# Patient Record
Sex: Male | Born: 1955 | Race: Black or African American | Hispanic: No | Marital: Single | Smoking: Former smoker
Health system: Southern US, Community
[De-identification: ages and names within clinical notes are randomized; demographics above are authoritative.]

## PROBLEM LIST (undated history)

## (undated) DIAGNOSIS — E119 Type 2 diabetes mellitus without complications: Secondary | ICD-10-CM

## (undated) DIAGNOSIS — E876 Hypokalemia: Secondary | ICD-10-CM

## (undated) DIAGNOSIS — L723 Sebaceous cyst: Secondary | ICD-10-CM

## (undated) DIAGNOSIS — I10 Essential (primary) hypertension: Secondary | ICD-10-CM

## (undated) DIAGNOSIS — E559 Vitamin D deficiency, unspecified: Secondary | ICD-10-CM

## (undated) DIAGNOSIS — B353 Tinea pedis: Secondary | ICD-10-CM

## (undated) HISTORY — DX: Vitamin D deficiency, unspecified: E55.9

## (undated) HISTORY — DX: Hypokalemia: E87.6

## (undated) HISTORY — PX: APPENDECTOMY: SHX54

## (undated) HISTORY — DX: Tinea pedis: B35.3

## (undated) HISTORY — DX: Sebaceous cyst: L72.3

---

## 2004-02-02 ENCOUNTER — Emergency Department: Payer: Self-pay | Admitting: Emergency Medicine

## 2005-01-26 ENCOUNTER — Emergency Department: Payer: Self-pay | Admitting: Emergency Medicine

## 2007-06-14 ENCOUNTER — Emergency Department: Payer: Self-pay | Admitting: Emergency Medicine

## 2007-07-16 ENCOUNTER — Emergency Department: Payer: Self-pay | Admitting: Emergency Medicine

## 2010-05-15 ENCOUNTER — Emergency Department: Payer: Self-pay | Admitting: Emergency Medicine

## 2015-03-25 ENCOUNTER — Emergency Department
Admission: EM | Admit: 2015-03-25 | Discharge: 2015-03-26 | Disposition: A | Discharge status: Home / Self Care | Payer: BLUE CROSS/BLUE SHIELD | Attending: Emergency Medicine | Admitting: Emergency Medicine

## 2015-03-25 ENCOUNTER — Emergency Department: Payer: BLUE CROSS/BLUE SHIELD

## 2015-03-25 ENCOUNTER — Encounter: Payer: Self-pay | Admitting: Emergency Medicine

## 2015-03-25 DIAGNOSIS — E876 Hypokalemia: Secondary | ICD-10-CM | POA: Diagnosis not present

## 2015-03-25 DIAGNOSIS — Z87891 Personal history of nicotine dependence: Secondary | ICD-10-CM | POA: Diagnosis not present

## 2015-03-25 DIAGNOSIS — I10 Essential (primary) hypertension: Secondary | ICD-10-CM

## 2015-03-25 DIAGNOSIS — E119 Type 2 diabetes mellitus without complications: Secondary | ICD-10-CM | POA: Insufficient documentation

## 2015-03-25 HISTORY — DX: Type 2 diabetes mellitus without complications: E11.9

## 2015-03-25 HISTORY — DX: Essential (primary) hypertension: I10

## 2015-03-25 LAB — TROPONIN I: TROPONIN I: 0.03 ng/mL (ref <0.031)

## 2015-03-25 LAB — BASIC METABOLIC PANEL
ANION GAP: 9 (ref 5–15)
BUN: 16 mg/dL (ref 6–20)
CALCIUM: 8.9 mg/dL (ref 8.9–10.3)
CHLORIDE: 100 mmol/L — AB (ref 101–111)
CO2: 28 mmol/L (ref 22–32)
Creatinine, Ser: 1.15 mg/dL (ref 0.61–1.24)
GFR calc non Af Amer: >60 mL/min (ref >60)
Glucose, Bld: 118 mg/dL — ABNORMAL HIGH (ref 65–99)
Potassium: 2.5 mmol/L — CL (ref 3.5–5.1)
Sodium: 137 mmol/L (ref 135–145)

## 2015-03-25 LAB — CBC
HCT: 41 % (ref 40.0–52.0)
HEMOGLOBIN: 14 g/dL (ref 13.0–18.0)
MCH: 27.6 pg (ref 26.0–34.0)
MCHC: 34.1 g/dL (ref 32.0–36.0)
MCV: 81 fL (ref 80.0–100.0)
Platelets: 248 10*3/uL (ref 150–440)
RBC: 5.06 MIL/uL (ref 4.40–5.90)
RDW: 14.7 % — ABNORMAL HIGH (ref 11.5–14.5)
WBC: 9.3 10*3/uL (ref 3.8–10.6)

## 2015-03-25 MED ORDER — POTASSIUM CHLORIDE 20 MEQ PO PACK
40.0000 meq | PACK | Freq: Two times a day (BID) | ORAL | Status: DC
Start: 2015-03-25 — End: 2015-03-26
  Administered 2015-03-25: 40 meq via ORAL
  Filled 2015-03-25 (×2): qty 2

## 2015-03-25 MED ORDER — LISINOPRIL 10 MG PO TABS
10.0000 mg | ORAL_TABLET | Freq: Once | ORAL | Status: AC
  Administered 2015-03-25: 10 mg via ORAL
  Filled 2015-03-25: qty 1

## 2015-03-25 MED ORDER — AMLODIPINE BESYLATE 5 MG PO TABS
5.0000 mg | ORAL_TABLET | Freq: Once | ORAL | Status: AC
  Administered 2015-03-25: 5 mg via ORAL
  Filled 2015-03-25: qty 1

## 2015-03-25 NOTE — ED Provider Notes (Signed)
[facility] Emergency Department Provider Note  ____________________________________________  Time seen: 11:00PM  I have reviewed the triage vital signs and the nursing notes.   HISTORY  Chief Complaint Hypertension    HPI [name] is a 60 y.o. male with history of Hypertension, Diabetes presents referred from [facility] for elevated BP and EKG changes. Patient states that he's been out of his amlodipine lisinopril and carvedilol for approximately few weeks and has only been taking his HCTZ. [facility] went to [facility] today for medication refill and was informed that his EKG was abnormal and referred to the emergency part. Patient presents with a blood pressure of 192/121. Patient denies any chest pain or shortness of breath or any other symptoms at this time.   Past Medical History  Diagnosis Date  . Hypertension   . Diabetes mellitus without complication (HCC)     There are no active problems to display for this patient.   Past Surgical History  Procedure Laterality Date  . Appendectomy      No current outpatient prescriptions on file.  Allergies No known drug allergies No family history on file.  Social History Social History  Substance Use Topics  . Smoking status: Former Games developermoker  . Smokeless tobacco: None  . Alcohol Use: No    Review of Systems  Constitutional: Negative for fever. Eyes: Negative for visual changes. ENT: Negative for sore throat. Cardiovascular: Negative for chest pain. Respiratory: Negative for shortness of breath. Gastrointestinal: Negative for abdominal pain, vomiting and diarrhea. Genitourinary: Negative for dysuria. Musculoskeletal: Negative for back pain. Skin: Negative for rash. Neurological: Negative for headaches, focal weakness or numbness.   10-point ROS otherwise negative.  ____________________________________________   PHYSICAL EXAM:  VITAL SIGNS: ED Triage Vitals  Enc  Vitals Group     BP 03/25/15 2025 192/121 mmHg     Pulse Rate 03/25/15 2025 81     Resp 03/25/15 2025 20     Temp 03/25/15 2025 97.6 F (36.4 C)     Temp Source 03/25/15 2025 Oral     SpO2 03/25/15 2025 97 %     Weight 03/25/15 2025 216 lb (97.977 kg)     Height 03/25/15 2025 5\' 9"  (1.753 m)     Head Cir --      Peak Flow --      Pain Score --      Pain Loc --      Pain Edu? --      Excl. in GC? --     Constitutional: Alert and oriented. Well appearing and in no distress. Eyes: Conjunctivae are normal. PERRL. Normal extraocular movements. ENT   Head: Normocephalic and atraumatic.   Nose: No congestion/rhinnorhea.   Mouth/Throat: Mucous membranes are moist.   Neck: No stridor. Hematological/Lymphatic/Immunilogical: No cervical lymphadenopathy. Cardiovascular: Normal rate, regular rhythm. Normal and symmetric distal pulses are present in all extremities. No murmurs, rubs, or gallops. Respiratory: Normal respiratory effort without tachypnea nor retractions. Breath sounds are clear and equal bilaterally. No wheezes/rales/rhonchi. Gastrointestinal: Soft and nontender. No distention. There is no CVA tenderness. Genitourinary: deferred Musculoskeletal: Nontender with normal range of motion in all extremities. No joint effusions.  No lower extremity tenderness nor edema. Neurologic:  Normal speech and language. No gross focal neurologic deficits are appreciated. Speech is normal.  Skin:  Skin is warm, dry and intact. No rash noted. Psychiatric: Mood and affect are normal. Speech and behavior are normal. Patient exhibits appropriate insight and judgment.  ____________________________________________  LABS (pertinent positives/negatives)  Labs Reviewed  BASIC METABOLIC PANEL - Abnormal; Notable for the following:    Potassium 2.5 (*)    Chloride 100 (*)    Glucose, Bld 118 (*)    All other components within normal limits  CBC - Abnormal; Notable for the following:     RDW 14.7 (*)    All other components within normal limits  TROPONIN I     ____________________________________________   EKG  ED ECG REPORT I, [place] N BROWN, the attending physician, personally viewed and interpreted this ECG.   Date: 03/25/2015  EKG Time: 8:40 PM  Rate: 81  Rhythm: Normal sinus rhythm with inverted T waves  Axis: Normal  Intervals: Normal  ST&T Change: None   ____________________________________________    RADIOLOGY     DG Chest 2 View (Final result) Result time: 03/25/15 21:15:05   Final result by Rad Results In Interface (03/25/15 21:15:05)   Narrative:   CLINICAL DATA: Hypertension.  EXAM: CHEST 2 VIEW  COMPARISON: None.  FINDINGS: Heart size is normal. Overall cardiomediastinal silhouette is within normal limits in size and configuration. Lungs are clear. Lung volumes are normal. No pleural effusion. Osseous and soft tissue structures about the chest are unremarkable.  IMPRESSION: Lungs are clear and there is no evidence of acute cardiopulmonary abnormality. Heart size is normal.   Electronically Signed By: Bary Richard M.D. On: 03/25/2015 21:15          INITIAL IMPRESSION / ASSESSMENT AND PLAN / ED COURSE  Pertinent labs & imaging results that were available during my care of the patient were reviewed by me and considered in my medical decision making (see chart for details).  Patient potassium noted to be 2.5 which is potential etiology for the patient's T-wave inversion. Following administration of 80 mEq and 40 mEq aliquots of potassium chloride patient's EKG normalized. Again patient has no chest pain or shortness of breath at this time  ____________________________________________   FINAL CLINICAL IMPRESSION(S) / ED DIAGNOSES  Final diagnoses:  Hypokalemia  Essential hypertension      Darci Current, MD 03/26/15 [phone]

## 2015-03-25 NOTE — ED Notes (Signed)
Family requesting to have their number left.  Francena HanlyStella [phone].

## 2015-03-25 NOTE — ED Notes (Signed)
[place] meal tray given to patient  

## 2015-03-25 NOTE — ED Notes (Signed)
Patient states he was sent over by his PCP due to an abnormal EKG. Patient denies N/V/D, CP or SOB.

## 2015-03-25 NOTE — ED Notes (Addendum)
Patient ambulatory to triage with steady gait, without difficulty or distress noted, pt sent over by [facility] for HTN and EKG changes; left arm BP 191/121, right arm 195/109; pt denies c/o; st st out of BP meds (amlodipine, lisinopril and carvedilol) for few wks and was only taking HCTZ

## 2015-03-26 LAB — BASIC METABOLIC PANEL
ANION GAP: 10 (ref 5–15)
BUN: 18 mg/dL (ref 6–20)
CHLORIDE: 99 mmol/L — AB (ref 101–111)
CO2: 28 mmol/L (ref 22–32)
Calcium: 8.7 mg/dL — ABNORMAL LOW (ref 8.9–10.3)
Creatinine, Ser: 1.14 mg/dL (ref 0.61–1.24)
GFR calc non Af Amer: >60 mL/min (ref >60)
Glucose, Bld: 192 mg/dL — ABNORMAL HIGH (ref 65–99)
POTASSIUM: 3.3 mmol/L — AB (ref 3.5–5.1)
SODIUM: 137 mmol/L (ref 135–145)

## 2015-03-26 LAB — TROPONIN I: Troponin I: <0.03 ng/mL (ref <0.031)

## 2015-03-26 MED ORDER — POTASSIUM CHLORIDE 20 MEQ PO PACK
40.0000 meq | PACK | Freq: Two times a day (BID) | ORAL | Status: DC
  Administered 2015-03-26: 40 meq via ORAL

## 2015-03-26 NOTE — Discharge Instructions (Signed)
Hypertension °Hypertension, commonly called high blood pressure, is when the force of blood pumping through your arteries is too strong. Your arteries are the blood vessels that carry blood from your heart throughout your body. A blood pressure reading consists of a higher number over a lower number, such as 110/72. The higher number (systolic) is the pressure inside your arteries when your heart pumps. The lower number (diastolic) is the pressure inside your arteries when your heart relaxes. Ideally you want your blood pressure below 120/80. °Hypertension forces your heart to work harder to pump blood. Your arteries may become narrow or stiff. Having untreated or uncontrolled hypertension can cause heart attack, stroke, kidney disease, and other problems. °RISK FACTORS °Some risk factors for high blood pressure are controllable. Others are not.  °Risk factors you cannot control include:  °· Race. You may be at higher risk if you are African American. °· Age. Risk increases with age. °· Gender. Men are at higher risk than women before age 45 years. After age 65, women are at higher risk than men. °Risk factors you can control include: °· Not getting enough exercise or physical activity. °· Being overweight. °· Getting too much fat, sugar, calories, or salt in your diet. °· Drinking too much alcohol. °SIGNS AND SYMPTOMS °Hypertension does not usually cause signs or symptoms. Extremely high blood pressure (hypertensive crisis) may cause headache, anxiety, shortness of breath, and nosebleed. °DIAGNOSIS °To check if you have hypertension, your health care provider will measure your blood pressure while you are seated, with your arm held at the level of your heart. It should be measured at least twice using the same arm. Certain conditions can cause a difference in blood pressure between your right and left arms. A blood pressure reading that is higher than normal on one occasion does not mean that you need treatment. If  it is not clear whether you have high blood pressure, you may be asked to return on a different day to have your blood pressure checked again. Or, you may be asked to monitor your blood pressure at home for 1 or more weeks. °TREATMENT °Treating high blood pressure includes making lifestyle changes and possibly taking medicine. Living a healthy lifestyle can help lower high blood pressure. You may need to change some of your habits. °Lifestyle changes may include: °· Following the DASH diet. This diet is high in fruits, vegetables, and whole grains. It is low in salt, red meat, and added sugars. °· Keep your sodium intake below 2,300 mg per day. °· Getting at least 30-45 minutes of aerobic exercise at least 4 times per week. °· Losing weight if necessary. °· Not smoking. °· Limiting alcoholic beverages. °· Learning ways to reduce stress. °Your health care provider may prescribe medicine if lifestyle changes are not enough to get your blood pressure under control, and if one of the following is true: °· You are 18-59 years of age and your systolic blood pressure is above 140. °· You are 60 years of age or older, and your systolic blood pressure is above 150. °· Your diastolic blood pressure is above 90. °· You have diabetes, and your systolic blood pressure is over 140 or your diastolic blood pressure is over 90. °· You have kidney disease and your blood pressure is above 140/90. °· You have heart disease and your blood pressure is above 140/90. °Your personal target blood pressure may vary depending on your medical conditions, your age, and other factors. °HOME CARE INSTRUCTIONS °·   Have your blood pressure rechecked as directed by your health care provider.   °· Take medicines only as directed by your health care provider. Follow the directions carefully. Blood pressure medicines must be taken as prescribed. The medicine does not work as well when you skip doses. Skipping doses also puts you at risk for  problems. °· Do not smoke.   °· Monitor your blood pressure at home as directed by your health care provider.  °SEEK MEDICAL CARE IF:  °· You think you are having a reaction to medicines taken. °· You have recurrent headaches or feel dizzy. °· You have swelling in your ankles. °· You have trouble with your vision. °SEEK IMMEDIATE MEDICAL CARE IF: °· You develop a severe headache or confusion. °· You have unusual weakness, numbness, or feel faint. °· You have severe chest or abdominal pain. °· You vomit repeatedly. °· You have trouble breathing. °MAKE SURE YOU:  °· Understand these instructions. °· Will watch your condition. °· Will get help right away if you are not doing well or get worse. °  °This information is not intended to replace advice given to you by your health care provider. Make sure you discuss any questions you have with your health care provider. °  °Document Released: 12/28/2004 Document Revised: 05/14/2014 Document Reviewed: 10/20/2012 °Elsevier Interactive Patient Education ©2016 [employer] ° °Hypokalemia °Hypokalemia means that the amount of potassium in the blood is lower than normal. Potassium is a chemical, called an electrolyte, that helps regulate the amount of fluid in the body. It also stimulates muscle contraction and helps nerves function properly. Most of the body's potassium is inside of cells, and only a very small amount is in the blood. Because the amount in the blood is so small, minor changes can be life-threatening. °CAUSES °· Antibiotics. °· Diarrhea or vomiting. °· Using laxatives too much, which can cause diarrhea. °· Chronic kidney disease. °· Water pills (diuretics). °· Eating disorders (bulimia). °· Low magnesium level. °· Sweating a lot. °SIGNS AND SYMPTOMS °· Weakness. °· Constipation. °· Fatigue. °· Muscle cramps. °· Mental confusion. °· Skipped heartbeats or irregular heartbeat (palpitations). °· Tingling or numbness. °DIAGNOSIS  °Your health care provider can  diagnose hypokalemia with blood tests. In addition to checking your potassium level, your health care provider may also check other lab tests. °TREATMENT °Hypokalemia can be treated with potassium supplements taken by mouth or adjustments in your current medicines. If your potassium level is very low, you may need to get potassium through a vein (IV) and be monitored in the hospital. A diet high in potassium is also helpful. Foods high in potassium are: °· Nuts, such as peanuts and pistachios. °· Seeds, such as sunflower seeds and pumpkin seeds. °· Peas, lentils, and lima beans. °· Whole grain and bran cereals and breads. °· Fresh fruit and vegetables, such as apricots, avocado, bananas, cantaloupe, kiwi, oranges, tomatoes, asparagus, and potatoes. °· Orange and tomato juices. °· Red meats. °· Fruit yogurt. °HOME CARE INSTRUCTIONS °· Take all medicines as prescribed by your health care provider. °· Maintain a healthy diet by including nutritious food, such as fruits, vegetables, nuts, whole grains, and lean meats. °· If you are taking a laxative, be sure to follow the directions on the label. °SEEK MEDICAL CARE IF: °· Your weakness gets worse. °· You feel your heart pounding or racing. °· You are vomiting or having diarrhea. °· You are diabetic and having trouble keeping your blood glucose in the normal range. °SEEK IMMEDIATE MEDICAL CARE IF: °·   You have chest pain, shortness of breath, or dizziness. °· You are vomiting or having diarrhea for more than 2 days. °· You faint. °MAKE SURE YOU:  °· Understand these instructions. °· Will watch your condition. °· Will get help right away if you are not doing well or get worse. °  °This information is not intended to replace advice given to you by your health care provider. Make sure you discuss any questions you have with your health care provider. °  °Document Released: 12/28/2004 Document Revised: 01/18/2014 Document Reviewed: 06/30/2012 °Elsevier Interactive Patient  Education ©2016 Elsevier Inc. ° °

## 2019-04-09 ENCOUNTER — Ambulatory Visit: Payer: BLUE CROSS/BLUE SHIELD | Attending: Internal Medicine

## 2019-04-09 DIAGNOSIS — Z23 Encounter for immunization: Secondary | ICD-10-CM

## 2019-04-09 NOTE — Progress Notes (Signed)
   Covid-19 Vaccination Clinic  Name:  KALON ERHARDT    MRN: [id number] DOB: 06/01/1955  04/09/2019  Mr. Gallaga was observed post Covid-19 immunization for 15 minutes without incident. He was provided with Vaccine Information Sheet and instruction to access the V-Safe system.   Mr. Maue was instructed to call 911 with any severe reactions post vaccine: Marland Kitchen Difficulty breathing  . Swelling of face and throat  . A fast heartbeat  . A bad rash all over body  . Dizziness and weakness   Immunizations Administered    Name Date Dose VIS Date Route   Pfizer COVID-19 Vaccine 04/09/2019  1:40 PM 0.3 mL 12/22/2018 Intramuscular   Manufacturer: [employer], [employer]   Lot: [id number]   NDC: [address]-3

## 2019-05-02 ENCOUNTER — Ambulatory Visit: Payer: Self-pay | Attending: Internal Medicine

## 2019-05-02 DIAGNOSIS — Z23 Encounter for immunization: Secondary | ICD-10-CM

## 2019-05-02 NOTE — Progress Notes (Signed)
   Covid-19 Vaccination Clinic  Name:  KYSER WANDEL    MRN: [id number] DOB: 1955/02/13  05/02/2019  Mr. Strine was observed post Covid-19 immunization for 15 minutes without incident. He was provided with Vaccine Information Sheet and instruction to access the V-Safe system.   Mr. Bells was instructed to call 911 with any severe reactions post vaccine: Marland Kitchen Difficulty breathing  . Swelling of face and throat  . A fast heartbeat  . A bad rash all over body  . Dizziness and weakness   Immunizations Administered    Name Date Dose VIS Date Route   Pfizer COVID-19 Vaccine 05/02/2019  9:34 AM 0.3 mL 03/07/2018 Intramuscular   Manufacturer: [employer], [employer]   Lot: [id number]   NDC: [address]-6

## 2020-10-06 ENCOUNTER — Encounter: Payer: Self-pay | Admitting: Cardiology

## 2020-10-06 ENCOUNTER — Other Ambulatory Visit: Payer: Self-pay

## 2020-10-06 ENCOUNTER — Ambulatory Visit: Payer: Medicare (Managed Care) | Admitting: Cardiology

## 2020-10-06 VITALS — BP 160/64 | HR 61 | Ht 69.0 in | Wt 225.0 lb

## 2020-10-06 DIAGNOSIS — R079 Chest pain, unspecified: Secondary | ICD-10-CM | POA: Diagnosis not present

## 2020-10-06 DIAGNOSIS — E7800 Pure hypercholesterolemia, unspecified: Secondary | ICD-10-CM

## 2020-10-06 DIAGNOSIS — I10 Essential (primary) hypertension: Secondary | ICD-10-CM

## 2020-10-06 MED ORDER — PANTOPRAZOLE SODIUM 40 MG PO TBEC: 40.0000 mg | DELAYED_RELEASE_TABLET | Freq: Every day | ORAL | 5 refills | Status: DC

## 2020-10-06 NOTE — Patient Instructions (Signed)
Medication Instructions:   Your physician has recommended you make the following change in your medication:   START taking Protonix 40 MG once a day.  *If you need a refill on your cardiac medications before your next appointment, please call your pharmacy*   Lab Work:  NONE ORDERED  If you have labs (blood work) drawn today and your tests are completely normal, you will receive your results only by: MyChart Message (if you have MyChart) OR A paper copy in the mail If you have any lab test that is abnormal or we need to change your treatment, we will call you to review the results.   Testing/Procedures:  Your physician has requested that you have an echocardiogram. Echocardiography is a painless test that uses sound waves to create images of your heart. It provides your doctor with information about the size and shape of your heart and how well your heart's chambers and valves are working. This procedure takes approximately one hour. There are no restrictions for this procedure.    Follow-Up: At [facility], you and your health needs are our priority.  As part of our continuing mission to provide you with exceptional heart care, we have created designated Provider Care Teams.  These Care Teams include your primary Cardiologist (physician) and Advanced Practice Providers (APPs -  Physician Assistants and Nurse Practitioners) who all work together to provide you with the care you need, when you need it.  We recommend signing up for the patient portal called "MyChart".  Sign up information is provided on this After Visit Summary.  MyChart is used to connect with patients for Virtual Visits (Telemedicine).  Patients are able to view lab/test results, encounter notes, upcoming appointments, etc.  Non-urgent messages can be sent to your provider as well.   To learn more about what you can do with MyChart, go to [email or web address].    Your next appointment:   4-6 weeks  The format  for your next appointment:   In Person  Provider:   Debbe Odea, MD   Other Instructions

## 2020-10-06 NOTE — Progress Notes (Signed)
Cardiology Office Note:    Date:  10/06/2020   ID:  [name], DOB [birth year], MRN [id number]  PCP:  Center, [facility] Providers Cardiologist:  Debbe Odea, MD     Referring MD: Center, [facility]*   Chief Complaint  Patient presents with   New Patient (Initial Visit)    Referred by PCP for Chest pain. Meds reviewed verbally with patient.     History of Present Illness:    [name] is a 65 y.o. male with a hx of hypertension, hyperlipidemia, diabetes who presents due to chest pain.  Patient states having symptoms of chest pain ongoing over the past month.  Symptoms are not related with exertion.  Symptoms usually come about with eating certain foods or drinking coffee.  Denies any personal history of heart disease.  Does not check his blood pressure frequently at home.  He tried taking over-the-counter antireflux medicine with improvement in symptoms.  Denies palpitations, edema, syncope.  Past Medical History:  Diagnosis Date   Dermatophytosis of foot    Diabetes mellitus without complication (HCC)    Hypertension    Hypokalemia    Sebaceous cyst    Vitamin D deficiency     Past Surgical History:  Procedure Laterality Date   APPENDECTOMY      Current Medications: Current Meds  Medication Sig   amLODipine (NORVASC) 10 MG tablet Take by mouth.   aspirin 81 MG EC tablet Take by mouth.   atorvastatin (LIPITOR) 40 MG tablet Take 40 mg by mouth daily.   carvedilol (COREG) 25 MG tablet Take 25 mg by mouth 2 (two) times daily with a meal.   glipiZIDE (GLUCOTROL) 10 MG tablet TAKE 1 TABLET BY MOUTH TWICE DAILY FOR BLOOD SUGAR   linagliptin (TRADJENTA) 5 MG TABS tablet Take by mouth.   lisinopril (ZESTRIL) 40 MG tablet Take by mouth.   pantoprazole (PROTONIX) 40 MG tablet Take 1 tablet (40 mg total) by mouth daily.   potassium chloride (KLOR-CON) 20 MEQ packet Take by mouth 2 (two) times daily.     Allergies:   Patient  has no known allergies.   Social History   Socioeconomic History   Marital status: Single    Spouse name: Not on file   Number of children: Not on file   Years of education: Not on file   Highest education level: Not on file  Occupational History   Not on file  Tobacco Use   Smoking status: Former   Smokeless tobacco: Never  Substance and Sexual Activity   Alcohol use: No   Drug use: Never   Sexual activity: Not on file  Other Topics Concern   Not on file  Social History Narrative   Not on file   Social Determinants of Health   Financial Resource Strain: Not on file  Food Insecurity: Not on file  Transportation Needs: Not on file  Physical Activity: Not on file  Stress: Not on file  Social Connections: Not on file     Family History: The patient's family history is not on file.  ROS:   Please see the history of present illness.     All other systems reviewed and are negative.  EKGs/Labs/Other Studies Reviewed:    The following studies were reviewed today:   EKG:  EKG is  ordered today.  The ekg ordered today demonstrates normal sinus rhythm  Recent Labs: No results found for requested labs within last 8760 hours.  Recent Lipid Panel No results found for: CHOL, TRIG, HDL, CHOLHDL, VLDL, LDLCALC, LDLDIRECT   Risk Assessment/Calculations:         Physical Exam:    VS:  BP (!) 160/64 (BP Location: Left Arm, Patient Position: Sitting, Cuff Size: Normal)   Pulse 61   Ht 5\' 9"  (1.753 m)   Wt 225 lb (102.1 kg)   SpO2 98%   BMI 33.23 kg/m     Wt Readings from Last 3 Encounters:  10/06/20 225 lb (102.1 kg)  03/25/15 216 lb (98 kg)     GEN:  Well nourished, well developed in no acute distress HEENT: Normal NECK: No JVD; No carotid bruits LYMPHATICS: No lymphadenopathy CARDIAC: RRR, no murmurs, rubs, gallops RESPIRATORY:  Clear to auscultation without rales, wheezing or rhonchi  ABDOMEN: Soft, non-tender, non-distended MUSCULOSKELETAL:  No edema; No  deformity  SKIN: Warm and dry NEUROLOGIC:  Alert and oriented x 3 PSYCHIATRIC:  Normal affect   ASSESSMENT:    1. Chest pain of uncertain etiology   2. Primary hypertension   3. Pure hypercholesterolemia    PLAN:    In order of problems listed above:  Chest pain, does not appear to be cardiac in etiology.  Possible GI.  Start Protonix 40 mg daily.  Get echocardiogram.  If symptoms improved with Protonix and echo is otherwise normal, will monitor.  If symptoms persist, will consider additional testing such as coronary CTA. Hypertension, BP elevated.  Advised patient to check BP at home.  Titrate medications if BP not well controlled. Hyperlipidemia, continue Lipitor.  Follow-up after echocardiogram.     Medication Adjustments/Labs and Tests Ordered: Current medicines are reviewed at length with the patient today.  Concerns regarding medicines are outlined above.  Orders Placed This Encounter  Procedures   EKG 12-Lead   ECHOCARDIOGRAM COMPLETE   Meds ordered this encounter  Medications   pantoprazole (PROTONIX) 40 MG tablet    Sig: Take 1 tablet (40 mg total) by mouth daily.    Dispense:  30 tablet    Refill:  5    Patient Instructions  Medication Instructions:   Your physician has recommended you make the following change in your medication:   START taking Protonix 40 MG once a day.  *If you need a refill on your cardiac medications before your next appointment, please call your pharmacy*   Lab Work:  NONE ORDERED  If you have labs (blood work) drawn today and your tests are completely normal, you will receive your results only by: MyChart Message (if you have MyChart) OR A paper copy in the mail If you have any lab test that is abnormal or we need to change your treatment, we will call you to review the results.   Testing/Procedures:  Your physician has requested that you have an echocardiogram. Echocardiography is a painless test that uses sound waves to  create images of your heart. It provides your doctor with information about the size and shape of your heart and how well your heart's chambers and valves are working. This procedure takes approximately one hour. There are no restrictions for this procedure.    Follow-Up: At [facility], you and your health needs are our priority.  As part of our continuing mission to provide you with exceptional heart care, we have created designated Provider Care Teams.  These Care Teams include your primary Cardiologist (physician) and Advanced Practice Providers (APPs -  Physician Assistants and Nurse Practitioners) who all work together to provide  you with the care you need, when you need it.  We recommend signing up for the patient portal called "MyChart".  Sign up information is provided on this After Visit Summary.  MyChart is used to connect with patients for Virtual Visits (Telemedicine).  Patients are able to view lab/test results, encounter notes, upcoming appointments, etc.  Non-urgent messages can be sent to your provider as well.   To learn more about what you can do with MyChart, go to [email or web address].    Your next appointment:   4-6 weeks  The format for your next appointment:   In Person  Provider:   Debbe Odea, MD   Other Instructions    Signed, Debbe Odea, MD  10/06/2020 9:52 AM    [facility]

## 2020-11-11 ENCOUNTER — Inpatient Hospital Stay
Admission: EM | Admit: 2020-11-11 | Discharge: 2020-11-15 | DRG: 309 | Disposition: A | Discharge status: Home / Self Care | Payer: Medicare (Managed Care) | Attending: Internal Medicine | Admitting: Internal Medicine

## 2020-11-11 ENCOUNTER — Telehealth: Payer: Self-pay | Admitting: Cardiology

## 2020-11-11 ENCOUNTER — Other Ambulatory Visit: Payer: Self-pay

## 2020-11-11 ENCOUNTER — Other Ambulatory Visit: Payer: Medicare (Managed Care)

## 2020-11-11 ENCOUNTER — Encounter: Payer: Self-pay | Admitting: Emergency Medicine

## 2020-11-11 ENCOUNTER — Emergency Department: Payer: Medicare (Managed Care)

## 2020-11-11 DIAGNOSIS — K219 Gastro-esophageal reflux disease without esophagitis: Secondary | ICD-10-CM | POA: Diagnosis present

## 2020-11-11 DIAGNOSIS — Z8249 Family history of ischemic heart disease and other diseases of the circulatory system: Secondary | ICD-10-CM

## 2020-11-11 DIAGNOSIS — N1831 Chronic kidney disease, stage 3a: Secondary | ICD-10-CM | POA: Diagnosis present

## 2020-11-11 DIAGNOSIS — Z7982 Long term (current) use of aspirin: Secondary | ICD-10-CM

## 2020-11-11 DIAGNOSIS — E559 Vitamin D deficiency, unspecified: Secondary | ICD-10-CM | POA: Diagnosis present

## 2020-11-11 DIAGNOSIS — E669 Obesity, unspecified: Secondary | ICD-10-CM | POA: Diagnosis present

## 2020-11-11 DIAGNOSIS — Z79899 Other long term (current) drug therapy: Secondary | ICD-10-CM | POA: Diagnosis not present

## 2020-11-11 DIAGNOSIS — E785 Hyperlipidemia, unspecified: Secondary | ICD-10-CM | POA: Diagnosis present

## 2020-11-11 DIAGNOSIS — R110 Nausea: Secondary | ICD-10-CM | POA: Diagnosis not present

## 2020-11-11 DIAGNOSIS — E1165 Type 2 diabetes mellitus with hyperglycemia: Secondary | ICD-10-CM | POA: Diagnosis present

## 2020-11-11 DIAGNOSIS — N189 Chronic kidney disease, unspecified: Secondary | ICD-10-CM

## 2020-11-11 DIAGNOSIS — E1122 Type 2 diabetes mellitus with diabetic chronic kidney disease: Secondary | ICD-10-CM | POA: Diagnosis present

## 2020-11-11 DIAGNOSIS — Z9049 Acquired absence of other specified parts of digestive tract: Secondary | ICD-10-CM | POA: Diagnosis not present

## 2020-11-11 DIAGNOSIS — E119 Type 2 diabetes mellitus without complications: Secondary | ICD-10-CM | POA: Diagnosis not present

## 2020-11-11 DIAGNOSIS — Z87891 Personal history of nicotine dependence: Secondary | ICD-10-CM

## 2020-11-11 DIAGNOSIS — Z7984 Long term (current) use of oral hypoglycemic drugs: Secondary | ICD-10-CM

## 2020-11-11 DIAGNOSIS — I10 Essential (primary) hypertension: Secondary | ICD-10-CM | POA: Diagnosis present

## 2020-11-11 DIAGNOSIS — R0902 Hypoxemia: Secondary | ICD-10-CM | POA: Diagnosis present

## 2020-11-11 DIAGNOSIS — I129 Hypertensive chronic kidney disease with stage 1 through stage 4 chronic kidney disease, or unspecified chronic kidney disease: Secondary | ICD-10-CM | POA: Diagnosis present

## 2020-11-11 DIAGNOSIS — R531 Weakness: Secondary | ICD-10-CM | POA: Diagnosis not present

## 2020-11-11 DIAGNOSIS — I442 Atrioventricular block, complete: Secondary | ICD-10-CM | POA: Diagnosis present

## 2020-11-11 DIAGNOSIS — N179 Acute kidney failure, unspecified: Secondary | ICD-10-CM | POA: Diagnosis not present

## 2020-11-11 DIAGNOSIS — R001 Bradycardia, unspecified: Secondary | ICD-10-CM | POA: Diagnosis present

## 2020-11-11 DIAGNOSIS — Z20822 Contact with and (suspected) exposure to covid-19: Secondary | ICD-10-CM | POA: Diagnosis present

## 2020-11-11 DIAGNOSIS — R0602 Shortness of breath: Secondary | ICD-10-CM | POA: Diagnosis present

## 2020-11-11 DIAGNOSIS — Z6832 Body mass index (BMI) 32.0-32.9, adult: Secondary | ICD-10-CM

## 2020-11-11 LAB — CBC
HCT: 36.3 % — ABNORMAL LOW (ref 39.0–52.0)
Hemoglobin: 12.7 g/dL — ABNORMAL LOW (ref 13.0–17.0)
MCH: 29.6 pg (ref 26.0–34.0)
MCHC: 35 g/dL (ref 30.0–36.0)
MCV: 84.6 fL (ref 80.0–100.0)
Platelets: 296 10*3/uL (ref 150–400)
RBC: 4.29 MIL/uL (ref 4.22–5.81)
RDW: 14 % (ref 11.5–15.5)
WBC: 13.5 10*3/uL — ABNORMAL HIGH (ref 4.0–10.5)
nRBC: 0 % (ref 0.0–0.2)

## 2020-11-11 LAB — TROPONIN I (HIGH SENSITIVITY)
Troponin I (High Sensitivity): 9 ng/L (ref <18)
Troponin I (High Sensitivity): 13 ng/L (ref <18)

## 2020-11-11 LAB — BASIC METABOLIC PANEL
Anion gap: 8 (ref 5–15)
BUN: 24 mg/dL — ABNORMAL HIGH (ref 8–23)
CO2: 25 mmol/L (ref 22–32)
Calcium: 9 mg/dL (ref 8.9–10.3)
Chloride: 105 mmol/L (ref 98–111)
Creatinine, Ser: 1.68 mg/dL — ABNORMAL HIGH (ref 0.61–1.24)
GFR, Estimated: 45 mL/min — ABNORMAL LOW (ref >60)
Glucose, Bld: 230 mg/dL — ABNORMAL HIGH (ref 70–99)
Potassium: 4.7 mmol/L (ref 3.5–5.1)
Sodium: 138 mmol/L (ref 135–145)

## 2020-11-11 LAB — TSH: TSH: 6.895 u[IU]/mL — ABNORMAL HIGH (ref 0.350–4.500)

## 2020-11-11 LAB — MAGNESIUM: Magnesium: 1.7 mg/dL (ref 1.7–2.4)

## 2020-11-11 MED ORDER — SODIUM CHLORIDE 0.9 % IV SOLN: 250.0000 mL | INTRAVENOUS | Status: DC | PRN

## 2020-11-11 MED ORDER — SODIUM CHLORIDE 0.9% FLUSH: 3.0000 mL | INTRAVENOUS | Status: DC | PRN

## 2020-11-11 MED ORDER — ASPIRIN EC 81 MG PO TBEC
81.0000 mg | DELAYED_RELEASE_TABLET | Freq: Every day | ORAL | Status: DC
  Administered 2020-11-12 – 2020-11-15 (×4): 81 mg via ORAL
  Filled 2020-11-11 (×4): qty 1

## 2020-11-11 MED ORDER — AMLODIPINE BESYLATE 10 MG PO TABS
10.0000 mg | ORAL_TABLET | Freq: Every day | ORAL | Status: DC
  Administered 2020-11-12 – 2020-11-15 (×4): 10 mg via ORAL
  Filled 2020-11-11: qty 1
  Filled 2020-11-11: qty 2
  Filled 2020-11-11 (×2): qty 1

## 2020-11-11 MED ORDER — HEPARIN SODIUM (PORCINE) 5000 UNIT/ML IJ SOLN
5000.0000 [IU] | Freq: Three times a day (TID) | INTRAMUSCULAR | Status: DC
  Administered 2020-11-12 – 2020-11-15 (×11): 5000 [IU] via SUBCUTANEOUS
  Filled 2020-11-11 (×9): qty 1

## 2020-11-11 MED ORDER — SODIUM CHLORIDE 0.9% FLUSH
3.0000 mL | Freq: Two times a day (BID) | INTRAVENOUS | Status: DC
  Administered 2020-11-11 – 2020-11-15 (×8): 3 mL via INTRAVENOUS

## 2020-11-11 MED ORDER — INSULIN ASPART 100 UNIT/ML IJ SOLN
0.0000 [IU] | Freq: Three times a day (TID) | INTRAMUSCULAR | Status: DC
  Administered 2020-11-12 (×2): 3 [IU] via SUBCUTANEOUS
  Administered 2020-11-12: 5 [IU] via SUBCUTANEOUS
  Administered 2020-11-13: 3 [IU] via SUBCUTANEOUS
  Administered 2020-11-13 (×2): 8 [IU] via SUBCUTANEOUS
  Administered 2020-11-14: 11 [IU] via SUBCUTANEOUS
  Administered 2020-11-14: 5 [IU] via SUBCUTANEOUS
  Administered 2020-11-14: 3 [IU] via SUBCUTANEOUS
  Administered 2020-11-15: 5 [IU] via SUBCUTANEOUS
  Administered 2020-11-15: 3 [IU] via SUBCUTANEOUS
  Filled 2020-11-11 (×9): qty 1

## 2020-11-11 MED ORDER — HYDRALAZINE HCL 20 MG/ML IJ SOLN: 10.0000 mg | Freq: Four times a day (QID) | INTRAMUSCULAR | Status: DC | PRN

## 2020-11-11 MED ORDER — ATORVASTATIN CALCIUM 20 MG PO TABS
40.0000 mg | ORAL_TABLET | Freq: Every day | ORAL | Status: DC
  Administered 2020-11-12 – 2020-11-15 (×4): 40 mg via ORAL
  Filled 2020-11-11 (×4): qty 2

## 2020-11-11 NOTE — Telephone Encounter (Signed)
-----   Message from Margrett Rud, [place] sent at 11/11/2020 11:17 AM EDT ----- Patient no showed Echo. Can we please try to scheduled that.   Patient has an upcoming appt 11/07 to follow up from the ECHO so that might need to be moved out as well.   Thanks you!

## 2020-11-11 NOTE — ED Notes (Signed)
Pt placed on pacer pads per MD Roxan Hockey) - No complaints of CP or distress noted.

## 2020-11-11 NOTE — H&P (Signed)
History and Physical    [name] CVE:[id number] DOB: Jun 14, 1955 DOA: 11/11/2020  PCP: [facility], [facility]    Patient coming from:  Home   Chief Complaint:  Shortness of breath   HPI: [name] is a 65 y.o. male seen in ed with complaints of shortness of breath while walking to her car.  Last week she noticed that this happened 2 times, patient notices it more during exertion and feels better at rest.  Patient denies feeling short of breath while at rest. Per ED provider note patient's beta-blocker dose had been recently increased.  Patient denies headaches blurred vision speech or gait issues chest pain, palpitations, nausea vomiting fevers chills abdominal pain bladder or bowel issues.  Pt has past medical history of diabetes mellitus type 2, hypertension, hypokalemia, sebaceous cyst, vitamin D deficiency.  ED Course:  Vitals:   11/11/20 2230 11/11/20 2300 11/11/20 2330 11/12/20 0030  BP: 110/61 (!) 107/59 (!) 114/59 116/60  Pulse: (!) 38 (!) 40 (!) 36 (!) 36  Resp: 20 20 20 18   Temp:      TempSrc:      SpO2: 95% 95% 96% 98%  Weight:      Height:      In the emergency room patient is alert awake oriented and bradycardic, oxygenating normal on room air.  Initial EKG shows a complete heart block with rates in the 40s, suspect from recent increase in beta-blocker therapy.  Blood work shows elevated glucose of 230, creatinine of 1.68, Magnesium of 1.7, CBC shows a white count 13.5 hemoglobin of 12.7 and platelet count of 296. ED provider had contacted Dr. of cardiology who agrees with plan for admission treating blood pressure and monitoring for hemodynamic changes and cardiology consult to follow.  Review of Systems:  Review of Systems  Constitutional: Negative.   HENT: Negative.    Eyes: Negative.   Respiratory:  Positive for shortness of breath.   Cardiovascular:  Negative for chest pain, palpitations, orthopnea, claudication and leg  swelling.  Gastrointestinal: Negative.   Genitourinary: Negative.   Musculoskeletal: Negative.   Skin: Negative.   Neurological: Negative.   Psychiatric/Behavioral: Negative.      Past Medical History:  Diagnosis Date   Dermatophytosis of foot    Diabetes mellitus without complication (HCC)    Hypertension    Hypokalemia    Sebaceous cyst    Vitamin D deficiency     Past Surgical History:  Procedure Laterality Date   APPENDECTOMY       reports that he has quit smoking. He has never used smokeless tobacco. He reports current alcohol use. He reports that he does not use drugs.  No Known Allergies   History reviewed. No pertinent family history.  Prior to Admission medications   Medication Sig Start Date End Date Taking? Authorizing Provider  amLODipine (NORVASC) 10 MG tablet Take by mouth.    [provider]  aspirin 81 MG EC tablet Take by mouth. 09/08/11   [provider]  atorvastatin (LIPITOR) 40 MG tablet Take 40 mg by mouth daily.    [provider]  carvedilol (COREG) 25 MG tablet Take 25 mg by mouth 2 (two) times daily with a meal. 09/08/11   [provider]  glipiZIDE (GLUCOTROL) 10 MG tablet TAKE 1 TABLET BY MOUTH TWICE DAILY FOR BLOOD SUGAR 05/09/19   [provider]  linagliptin (TRADJENTA) 5 MG TABS tablet Take by mouth.    [provider]  lisinopril (  ZESTRIL) 40 MG tablet Take by mouth. 09/08/11   [provider]  pantoprazole (PROTONIX) 40 MG tablet Take 1 tablet (40 mg total) by mouth daily. 10/06/20   Debbe Odea, MD  potassium chloride (KLOR-CON) 20 MEQ packet Take by mouth 2 (two) times daily.    [provider]    Physical Exam: Vitals:   11/11/20 2230 11/11/20 2300 11/11/20 2330 11/12/20 0030  BP: 110/61 (!) 107/59 (!) 114/59 116/60  Pulse: (!) 38 (!) 40 (!) 36 (!) 36  Resp: 20 20 20 18   Temp:      TempSrc:      SpO2: 95% 95% 96% 98%  Weight:      Height:       Physical  Exam Vitals and nursing note reviewed.  Constitutional:      General: He is not in acute distress.    Appearance: Normal appearance. He is not ill-appearing, toxic-appearing or diaphoretic.  HENT:     Head: Normocephalic and atraumatic.     Right Ear: External ear normal.     Left Ear: External ear normal.     Nose: Nose normal.     Mouth/Throat:     Mouth: Mucous membranes are moist.  Eyes:     Extraocular Movements: Extraocular movements intact.     Pupils: Pupils are equal, round, and reactive to light.  Neck:     Vascular: No carotid bruit.  Cardiovascular:     Rate and Rhythm: Normal rate and regular rhythm.     Pulses: Normal pulses.     Heart sounds: Normal heart sounds.  Pulmonary:     Effort: Pulmonary effort is normal.     Breath sounds: Normal breath sounds.  Abdominal:     General: Bowel sounds are normal.     Palpations: Abdomen is soft.  Musculoskeletal:     Right lower leg: No edema.     Left lower leg: No edema.  Skin:    General: Skin is warm.  Neurological:     General: No focal deficit present.     Mental Status: He is alert and oriented to person, place, and time.  Psychiatric:        Mood and Affect: Mood normal.        Behavior: Behavior normal.    Labs on Admission: I have personally reviewed following labs and imaging studies  No results for input(s): CKTOTAL, CKMB, TROPONINI in the last 72 hours. Lab Results  Component Value Date   WBC 13.5 (H) 11/11/2020   HGB 12.7 (L) 11/11/2020   HCT 36.3 (L) 11/11/2020   MCV 84.6 11/11/2020   PLT 296 11/11/2020    Recent Labs  Lab 11/11/20 2036  NA 138  K 4.7  CL 105  CO2 25  BUN 24*  CREATININE 1.68*  CALCIUM 9.0  GLUCOSE 230*    Radiological Exams on Admission: DG Chest 1 View  Result Date: 11/11/2020 CLINICAL DATA:  Shortness of breath today. EXAM: CHEST  1 VIEW COMPARISON:  03/25/2015 FINDINGS: Lower lung volumes from prior exam. Upper normal heart size likely accentuated by  technique. There is aortic tortuosity. Aortic atherosclerosis. Bronchovascular crowding with vascular congestion. No overt edema. No focal airspace disease. No pleural effusion or pneumothorax. No acute osseous findings. IMPRESSION: Low lung volumes with bronchovascular crowding and vascular congestion. Electronically Signed   By: 03/27/2015 M.D.   On: 11/11/2020 20:46    EKG: Independently reviewed.  Junctional rhythm . CHB 40 with prolonged PR .  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  Complete heart block on EKG: Case discussed with Dr. Johney Frame of cardiology who was recommending holding antihypertensives and inpatient admission and is to see patient tomorrow. External pacer pads.  Patient will be admitted to the stepdown unit with continuous cardiac monitoring and vitals.  Hypertension: Blood pressure 116/60, pulse (!) 36, temperature 98 F (36.7 C), temperature source Oral, resp. rate 18, height 5\' 9"  (1.753 m), weight 99.8 kg, SpO2 98 %. Blood pressure is low, we will continue patient on amlodipine and as needed hydralazine. Lisinopril held due to kidney function and Coreg held due to complete heart block and heart rate in the 40s.  Dm II: Glycemic protocol, we will hold patient's glipizide, Tradjenta to avoid hypoglycemia while in the hospital.  DVT prophylaxis:  Heparin  Code Status:  Full code  Family Communication:  (Sister)  [phone] (Mobile)   Disposition Plan:  Home  Consults called:  Dr.Allred.   Admission status: Inpatient     [phone] MD [facility] [phone] How to contact the [facility] Attending or Consulting provider 7A - 7P or covering provider during after hours 7P -7A, for this patient.    Check the care team in [facility] and look for a) attending/consulting [facility] provider listed and b) the [facility] team listed Log into [email or web address] and use [facility] universal password to access. If you do not have the password, please contact the hospital operator. Locate the [facility] provider you are looking for under [facility] and page to a number that you can be directly reached. If you still have difficulty reaching the provider, please page the [facility] (Director on Call) for the Hospitalists listed on amion for assistance. [email or web address] Password [email or web address] 11/12/2020, 1:09 AM

## 2020-11-11 NOTE — ED Triage Notes (Signed)
Pt to ED from home c/o SOB today after shopping and walking to car.  States had two episodes last week that resolved themselves but seems worse after walking.  Pt denies pain, cough, or n/v/d.  Denies feeling SOB while sitting in wheelchair during triage, A&Ox4, chest rise even and unlabored, skin WNL.

## 2020-11-11 NOTE — Telephone Encounter (Signed)
No phone number listed & patient contact his sister was also not available to LVM

## 2020-11-11 NOTE — ED Provider Notes (Signed)
[facility] Emergency Department Provider Note    Event Date/Time   First MD Initiated Contact with Patient 11/11/20 2109     (approximate)  I have reviewed the triage vital signs and the nursing notes.   HISTORY  Chief Complaint Shortness of Breath    HPI [name] is a 65 y.o. male with below listed past medical history presents to the ER for evaluation of several days of worsening generalized weakness and fatigue as well as shortness of breath particular with any exertion.  Is not having particularly pain.  No cough or congestion.  No swelling.  Easily multiple antihypertensive medications.  No history of DVT or PE.  No pain with deep inspiration.  Past Medical History:  Diagnosis Date   Dermatophytosis of foot    Diabetes mellitus without complication (HCC)    Hypertension    Hypokalemia    Sebaceous cyst    Vitamin D deficiency    History reviewed. No pertinent family history. Past Surgical History:  Procedure Laterality Date   APPENDECTOMY     Patient Active Problem List   Diagnosis Date Noted   SOB (shortness of breath) 11/11/2020   HTN (hypertension), benign 11/11/2020   Bradycardia 11/11/2020   Complete heart block (HCC) 11/11/2020       Prior to Admission medications   Medication Sig Start Date End Date Taking? Authorizing Provider  amLODipine (NORVASC) 10 MG tablet Take by mouth.    [provider]  aspirin 81 MG EC tablet Take by mouth. 09/08/11   [provider]  atorvastatin (LIPITOR) 40 MG tablet Take 40 mg by mouth daily.    [provider]  carvedilol (COREG) 25 MG tablet Take 25 mg by mouth 2 (two) times daily with a meal. 09/08/11   [provider]  glipiZIDE (GLUCOTROL) 10 MG tablet TAKE 1 TABLET BY MOUTH TWICE DAILY FOR BLOOD SUGAR 05/09/19   [provider]  linagliptin (TRADJENTA) 5 MG TABS tablet Take by mouth.    [provider]  lisinopril (ZESTRIL) 40 MG  tablet Take by mouth. 09/08/11   [provider]  pantoprazole (PROTONIX) 40 MG tablet Take 1 tablet (40 mg total) by mouth daily. 10/06/20   Debbe Odea, MD  potassium chloride (KLOR-CON) 20 MEQ packet Take by mouth 2 (two) times daily.    [provider]    Allergies Patient has no known allergies.    Social History Social History   Tobacco Use   Smoking status: Former   Smokeless tobacco: Never  Substance Use Topics   Alcohol use: Yes    Comment: occ.   Drug use: Never    Review of Systems Patient denies headaches, rhinorrhea, blurry vision, numbness, shortness of breath, chest pain, edema, cough, abdominal pain, nausea, vomiting, diarrhea, dysuria, fevers, rashes or hallucinations unless otherwise stated above in HPI. ____________________________________________   PHYSICAL EXAM:  VITAL SIGNS: Vitals:   11/11/20 2145 11/11/20 2200  BP: 118/62 (!) 120/59  Pulse: (!) 40 (!) 39  Resp: 16 18  Temp:    SpO2: 95% 93%    Constitutional: Alert and oriented.  Eyes: Conjunctivae are normal.  Head: Atraumatic. Nose: No congestion/rhinnorhea. Mouth/Throat: Mucous membranes are moist.   Neck: No stridor. Painless ROM.  Cardiovascular: bradycardic, regular rhythm. Grossly normal heart sounds.  Good peripheral circulation. Respiratory: Normal respiratory effort.  No retractions. Lungs CTAB. Gastrointestinal: Soft and nontender. No distention. No abdominal bruits. No CVA tenderness. Genitourinary: deferred Musculoskeletal: No lower  extremity tenderness nor edema.  No joint effusions. Neurologic:  Normal speech and language. No gross focal neurologic deficits are appreciated. No facial droop Skin:  Skin is warm, dry and intact. No rash noted. Psychiatric: Mood and affect are normal. Speech and behavior are normal.  ____________________________________________   LABS (all labs ordered are listed, but only abnormal results are displayed)  Results for  orders placed or performed during the hospital encounter of 11/11/20 (from the past 24 hour(s))  Basic metabolic panel     Status: Abnormal   Collection Time: 11/11/20  8:36 PM  Result Value Ref Range   Sodium 138 135 - 145 mmol/L   Potassium 4.7 3.5 - 5.1 mmol/L   Chloride 105 98 - 111 mmol/L   CO2 25 22 - 32 mmol/L   Glucose, Bld 230 (H) 70 - 99 mg/dL   BUN 24 (H) 8 - 23 mg/dL   Creatinine, Ser 7.61 (H) 0.61 - 1.24 mg/dL   Calcium 9.0 8.9 - 60.7 mg/dL   GFR, Estimated 45 (L) >60 mL/min   Anion gap 8 5 - 15  CBC     Status: Abnormal   Collection Time: 11/11/20  8:36 PM  Result Value Ref Range   WBC 13.5 (H) 4.0 - 10.5 K/uL   RBC 4.29 4.22 - 5.81 MIL/uL   Hemoglobin 12.7 (L) 13.0 - 17.0 g/dL   HCT 37.1 (L) 06.2 - 69.4 %   MCV 84.6 80.0 - 100.0 fL   MCH 29.6 26.0 - 34.0 pg   MCHC 35.0 30.0 - 36.0 g/dL   RDW 85.4 62.7 - 03.5 %   Platelets 296 150 - 400 K/uL   nRBC 0.0 0.0 - 0.2 %  Troponin I (High Sensitivity)     Status: None   Collection Time: 11/11/20  8:36 PM  Result Value Ref Range   Troponin I (High Sensitivity) 9 <18 ng/L  Magnesium     Status: None   Collection Time: 11/11/20  8:36 PM  Result Value Ref Range   Magnesium 1.7 1.7 - 2.4 mg/dL  TSH     Status: Abnormal   Collection Time: 11/11/20  8:36 PM  Result Value Ref Range   TSH 6.895 (H) 0.350 - 4.500 uIU/mL   ____________________________________________  EKG My review and personal interpretation at Time: 20:27   Indication: sob  Rate: 40  Rhythm: complete av block Axis: normal Other: normal intervals, no stemi ____________________________________________  RADIOLOGY  I personally reviewed all radiographic images ordered to evaluate for the above acute complaints and reviewed radiology reports and findings.  These findings were personally discussed with the patient.  Please see medical record for radiology report. ____________________________________________   PROCEDURES  Procedure(s) performed:   .Critical Care Performed by: Willy Eddy, MD Authorized by: Willy Eddy, MD   Critical care provider statement:    Critical care time (minutes):  32   Critical care was necessary to treat or prevent imminent or life-threatening deterioration of the following conditions:  Cardiac failure   Critical care was time spent personally by me on the following activities:  Ordering and performing treatments and interventions, ordering and review of laboratory studies, ordering and review of radiographic studies, pulse oximetry, re-evaluation of patient's condition, review of old charts, obtaining history from patient or surrogate, examination of patient, evaluation of patient's response to treatment, discussions with primary provider, discussions with consultants and development of treatment plan with patient or surrogate    Critical Care performed: yes ____________________________________________   INITIAL  IMPRESSION / ASSESSMENT AND PLAN / ED COURSE  Pertinent labs & imaging results that were available during my care of the patient were reviewed by me and considered in my medical decision making (see chart for details).   DDX: bradycardia, Asthma, copd, CHF, pna, ptx, malignancy, Pe, anemia   [name] is a 65 y.o. who presents to the ED with presentation as described above.  Afebrile but bradycardic with EKG showing evidence of complete heart block.  He is mentating appropriately blood pressure is stable.  Was placed on pacer pads.  Electrolytes normal.  No findings to suggest acute CHF.  Discussed case in consultation with Dr. Johney Frame of cardiology who agrees with plan for holding antihypertensive medications admission to the hospital for further hemodynamic monitoring and cardiology consultation.  Case is discussed with hospitalist for admission.     The patient was evaluated in Emergency Department today for the symptoms described in the history of present illness. He/she was  evaluated in the context of the global COVID-19 pandemic, which necessitated consideration that the patient might be at risk for infection with the SARS-CoV-2 virus that causes COVID-19. Institutional protocols and algorithms that pertain to the evaluation of patients at risk for COVID-19 are in a state of rapid change based on information released by regulatory bodies including the CDC and federal and state organizations. These policies and algorithms were followed during the patient's care in the ED.  As part of my medical decision making, I reviewed the following data within the electronic MEDICAL RECORD NUMBER Nursing notes reviewed and incorporated, Labs reviewed, notes from prior ED visits and [place] Controlled Substance Database   ____________________________________________   FINAL CLINICAL IMPRESSION(S) / ED DIAGNOSES  Final diagnoses:  Complete heart block (HCC)  Shortness of breath      NEW MEDICATIONS STARTED DURING THIS VISIT:  New Prescriptions   No medications on file     Note:  This document was prepared using Dragon voice recognition software and may include unintentional dictation errors.    Willy Eddy, MD 11/11/20 2238

## 2020-11-12 ENCOUNTER — Inpatient Hospital Stay (HOSPITAL_COMMUNITY)
Admit: 2020-11-12 | Discharge: 2020-11-12 | Disposition: A | Discharge status: Home / Self Care | Payer: Medicare (Managed Care) | Attending: Cardiology | Admitting: Cardiology

## 2020-11-12 ENCOUNTER — Encounter: Payer: Self-pay | Admitting: Internal Medicine

## 2020-11-12 DIAGNOSIS — I10 Essential (primary) hypertension: Secondary | ICD-10-CM | POA: Diagnosis not present

## 2020-11-12 DIAGNOSIS — I442 Atrioventricular block, complete: Principal | ICD-10-CM

## 2020-11-12 DIAGNOSIS — E119 Type 2 diabetes mellitus without complications: Secondary | ICD-10-CM | POA: Diagnosis not present

## 2020-11-12 DIAGNOSIS — R0602 Shortness of breath: Secondary | ICD-10-CM

## 2020-11-12 LAB — ECHOCARDIOGRAM COMPLETE
AR max vel: 3.07 cm2
AV Area VTI: 3.21 cm2
AV Area mean vel: 3.1 cm2
AV Mean grad: 3 mmHg
AV Peak grad: 6.7 mmHg
Ao pk vel: 1.29 m/s
Area-P 1/2: 3.58 cm2
Height: 69 in
MV VTI: 2.43 cm2
S' Lateral: 2.75 cm
Weight: 3520 oz

## 2020-11-12 LAB — CBG MONITORING, ED
Glucose-Capillary: 207 mg/dL — ABNORMAL HIGH (ref 70–99)
Glucose-Capillary: 198 mg/dL — ABNORMAL HIGH (ref 70–99)
Glucose-Capillary: 197 mg/dL — ABNORMAL HIGH (ref 70–99)
Glucose-Capillary: 238 mg/dL — ABNORMAL HIGH (ref 70–99)

## 2020-11-12 LAB — MRSA NEXT GEN BY PCR, NASAL: MRSA by PCR Next Gen: NOT DETECTED

## 2020-11-12 LAB — HEMOGLOBIN A1C
Hgb A1c MFr Bld: 8.6 % — ABNORMAL HIGH (ref 4.8–5.6)
Mean Plasma Glucose: 200.12 mg/dL

## 2020-11-12 LAB — GLUCOSE, CAPILLARY: Glucose-Capillary: 189 mg/dL — ABNORMAL HIGH (ref 70–99)

## 2020-11-12 LAB — RESP PANEL BY RT-PCR (FLU A&B, COVID) ARPGX2
Influenza A by PCR: NEGATIVE
Influenza B by PCR: NEGATIVE
SARS Coronavirus 2 by RT PCR: NEGATIVE

## 2020-11-12 LAB — HIV ANTIBODY (ROUTINE TESTING W REFLEX): HIV Screen 4th Generation wRfx: NONREACTIVE

## 2020-11-12 MED ORDER — INSULIN GLARGINE-YFGN 100 UNIT/ML ~~LOC~~ SOLN
12.0000 [IU] | Freq: Every day | SUBCUTANEOUS | Status: DC
  Administered 2020-11-12 – 2020-11-13 (×2): 12 [IU] via SUBCUTANEOUS
  Filled 2020-11-12 (×2): qty 0.12

## 2020-11-12 NOTE — ED Notes (Signed)
Okay to call report at [phone]

## 2020-11-12 NOTE — ED Notes (Signed)
Admitting physician at bedside to discuss plan of care with patient and family

## 2020-11-12 NOTE — Progress Notes (Signed)
*  PRELIMINARY RESULTS* Echocardiogram 2D Echocardiogram has been performed.  [name] 11/12/2020, 2:37 PM

## 2020-11-12 NOTE — Consult Note (Signed)
Electrophysiology Consultation:   Patient ID: [name] MRN: [id number]; DOB: 12-Jan-1956  Admit date: 11/11/2020 Date of Consult: 11/12/2020  PCP:  [facility]   [facility] Providers Cardiologist:  Debbe Odea, MD    Patient Profile:   [name] is a 65 y.o. male with a hx of hypertension and diabetes who is being seen 11/12/2020 for the evaluation of complete heart block at the request of Dr. Azucena Cecil.  History of Present Illness:   Mr. [name] presented to the emergency room yesterday after experiencing acute onset dyspnea while walking to his car.  The symptoms only occurred with exertion and improved when he rested.  In the emergency room he was noted to be bradycardic and EKG confirmed complete heart block with a ventricular escape in the 40s.  The patient does take carvedilol 25 mg by mouth twice daily.  He reportedly had a dose increased recently.  At the time of my evaluation, the patient denied syncope or presyncope.  He confirmed he was taking carvedilol twice daily.  No other changes to medications recently.  No fevers or chills.   Past Medical History:  Diagnosis Date   Dermatophytosis of foot    Diabetes mellitus without complication (HCC)    Hypertension    Hypokalemia    Sebaceous cyst    Vitamin D deficiency     Past Surgical History:  Procedure Laterality Date   APPENDECTOMY         Inpatient Medications: Scheduled Meds:  amLODipine  10 mg Oral Daily   aspirin EC  81 mg Oral Daily   atorvastatin  40 mg Oral Daily   heparin  5,000 Units Subcutaneous Q8H   insulin aspart  0-15 Units Subcutaneous TID WC   sodium chloride flush  3 mL Intravenous Q12H   Continuous Infusions:  sodium chloride     PRN Meds: sodium chloride, hydrALAZINE, sodium chloride flush  Allergies:   No Known Allergies  Social History:   Social History   Socioeconomic History   Marital status: Single    Spouse name: Not on file    Number of children: Not on file   Years of education: Not on file   Highest education level: Not on file  Occupational History   Not on file  Tobacco Use   Smoking status: Former   Smokeless tobacco: Never  Substance and Sexual Activity   Alcohol use: Yes    Comment: occ.   Drug use: Never   Sexual activity: Not on file  Other Topics Concern   Not on file  Social History Narrative   Not on file   Social Determinants of Health   Financial Resource Strain: Not on file  Food Insecurity: Not on file  Transportation Needs: Not on file  Physical Activity: Not on file  Stress: Not on file  Social Connections: Not on file  Intimate Partner Violence: Not on file    Family History:    Family History  Problem Relation Age of Onset   Heart disease Mother    Stroke Father      ROS:  Please see the history of present illness.   All other ROS reviewed and negative.     Physical Exam/Data:   Vitals:   11/12/20 0830 11/12/20 1030 11/12/20 1100 11/12/20 1130  BP: 134/63 123/66 135/72 104/65  Pulse: (!) 38 (!) 41 (!) 39 (!) 40  Resp: 19 20 12 12   Temp:    98 F (36.7 C)  TempSrc:    Oral  SpO2: 90% 95% 93% 98%  Weight:      Height:       No intake or output data in the 24 hours ending 11/12/20 1221 Last 3 Weights 11/11/2020 10/06/2020 03/25/2015  Weight (lbs) 220 lb 225 lb 216 lb  Weight (kg) 99.791 kg 102.059 kg 97.977 kg     Body mass index is 32.49 kg/m.  General:  Well nourished, well developed, in no acute distress laying flat HEENT: normal Neck: no JVD Vascular: No carotid bruits; Distal pulses 2+ bilaterally Cardiac: Regular rhythm.  Bradycardic.  Warm and well-perfused.  Nonedematous. Lungs:  clear to auscultation bilaterally, no wheezing, rhonchi or rales  Abd: soft, nontender, no hepatomegaly  Ext: no edema Musculoskeletal:  No deformities, BUE and BLE strength normal and equal Skin: warm and dry  Neuro:  CNs 2-12 intact, no focal abnormalities  noted Psych:  Normal affect   EKG:  The EKG was personally reviewed and demonstrates: Complete heart block with a narrow QRS.Ventricular response in the low 40s. Telemetry:  Telemetry was personally reviewed and demonstrates:  sinus rhythm with complete heart block.  Relevant CV Studies: Echo pending   Laboratory Data:  High Sensitivity Troponin:   Recent Labs  Lab 11/11/20 2036 11/11/20 2235  TROPONINIHS 9 13     Chemistry Recent Labs  Lab 11/11/20 2036  NA 138  K 4.7  CL 105  CO2 25  GLUCOSE 230*  BUN 24*  CREATININE 1.68*  CALCIUM 9.0  MG 1.7  GFRNONAA 45*  ANIONGAP 8    No results for input(s): PROT, ALBUMIN, AST, ALT, ALKPHOS, BILITOT in the last 168 hours. Lipids No results for input(s): CHOL, TRIG, HDL, LABVLDL, LDLCALC, CHOLHDL in the last 168 hours.  Hematology Recent Labs  Lab 11/11/20 2036  WBC 13.5*  RBC 4.29  HGB 12.7*  HCT 36.3*  MCV 84.6  MCH 29.6  MCHC 35.0  RDW 14.0  PLT 296   Thyroid  Recent Labs  Lab 11/11/20 2036  TSH 6.895*    BNPNo results for input(s): BNP, PROBNP in the last 168 hours.  DDimer No results for input(s): DDIMER in the last 168 hours.   Radiology/Studies:  DG Chest 1 View  Result Date: 11/11/2020 CLINICAL DATA:  Shortness of breath today. EXAM: CHEST  1 VIEW COMPARISON:  03/25/2015 FINDINGS: Lower lung volumes from prior exam. Upper normal heart size likely accentuated by technique. There is aortic tortuosity. Aortic atherosclerosis. Bronchovascular crowding with vascular congestion. No overt edema. No focal airspace disease. No pleural effusion or pneumothorax. No acute osseous findings. IMPRESSION: Low lung volumes with bronchovascular crowding and vascular congestion. Electronically Signed   By: Narda Rutherford M.D.   On: 11/11/2020 20:46     Assessment and Plan:   Complete heart block Stable escape. No indication for temporary transvenous pacing at this time. Maintain Zoll pads on patient.   Stop  carvedilol.  Recommend 48-hour washout and continuous monitoring heart rhythm during this time.  If no improvement in conduction by Friday morning, would favor transfer to [facility] in [place] for permanent pacing on Friday.  If his conduction improves after carvedilol washes out, could likely discharge with close outpatient follow-up. Echo pending.  Cardiology to follow.   For questions or updates, please contact [facility] Please consult [email or web address] for contact info under    Signed, Lanier Prude, MD  11/12/2020 12:21 PM

## 2020-11-12 NOTE — Consult Note (Signed)
Cardiology Consultation:   Patient ID: [name] MRN: [id number]; DOB: 03-May-1955  Admit date: 11/11/2020 Date of Consult: 11/12/2020  PCP:  [facility]   [facility] Providers Cardiologist:  Debbe Odea, MD        Patient Profile:   [name] is a 65 y.o. male with a hx of hypertension, diabetes who is being seen 11/12/2020 for the evaluation of heart block at the request of Dr. Allena Katz.  History of Present Illness:   [name] is a 65 year old male with history of hypertension, diabetes presenting with 2 to 3 weeks of shortness of breath and fatigue.  Patient states not feeling well over the past several weeks.  Was supposed to follow-up with myself yesterday at outpatient but forgot.  Previously seen in the outpatient setting due to chest pain deemed secondary to GI etiology.  Was started on Protonix with resolution of symptoms.  Echocardiogram was ordered, currently pending being performed.  He takes cardiac 25 mg twice daily for BP control.  Due to worsening symptoms of fatigue and shortness of breath, he presented to the ED.  Initial EKG obtained showed third-degree AV block.  Heart rates 37.   Past Medical History:  Diagnosis Date   Dermatophytosis of foot    Diabetes mellitus without complication (HCC)    Hypertension    Hypokalemia    Sebaceous cyst    Vitamin D deficiency     Past Surgical History:  Procedure Laterality Date   APPENDECTOMY       Home Medications:  Prior to Admission medications   Medication Sig Start Date End Date Taking? Authorizing Provider  amLODipine (NORVASC) 10 MG tablet Take 10 mg by mouth daily.   Yes [provider]  atorvastatin (LIPITOR) 40 MG tablet Take 40 mg by mouth daily.   Yes [provider]  carvedilol (COREG) 25 MG tablet Take 25 mg by mouth 2 (two) times daily with a meal. 09/08/11  Yes [provider]  furosemide (LASIX) 20 MG tablet Take 20 mg by mouth daily.    Yes [provider]  glipiZIDE (GLUCOTROL) 10 MG tablet TAKE 1 TABLET BY MOUTH TWICE DAILY FOR BLOOD SUGAR 05/09/19  Yes [provider]  lisinopril (ZESTRIL) 40 MG tablet Take 40 mg by mouth daily. 09/08/11  Yes [provider]  metFORMIN (GLUCOPHAGE) 500 MG tablet Take 1,000 mg by mouth daily with breakfast.   Yes [provider]  pantoprazole (PROTONIX) 40 MG tablet Take 1 tablet (40 mg total) by mouth daily. 10/06/20  Yes Agbor-Etang, Arlys John, MD  potassium chloride (KLOR-CON) 20 MEQ packet Take 20 mEq by mouth 2 (two) times daily.   Yes [provider]  aspirin 81 MG EC tablet Take by mouth. Patient not taking: No sig reported 09/08/11   [provider]  exenatide (BYETTA) 10 MCG/0.04ML SOPN injection Inject 10 mcg into the skin 2 (two) times daily before a meal. Patient not taking: No sig reported    [provider]  linagliptin (TRADJENTA) 5 MG TABS tablet Take by mouth. Patient not taking: Reported on 11/12/2020    [provider]  OZEMPIC, 0.25 OR 0.5 MG/DOSE, 2 MG/1.5ML SOPN Inject 0.5 mg into the skin once a week. Sunday 10/07/20   [provider]  spironolactone (ALDACTONE) 25 MG tablet Take 25 mg by mouth daily. Patient not taking: Reported on 11/12/2020    [provider]    Inpatient Medications: Scheduled Meds:  amLODipine  10  mg Oral Daily   aspirin EC  81 mg Oral Daily   atorvastatin  40 mg Oral Daily   heparin  5,000 Units Subcutaneous Q8H   insulin aspart  0-15 Units Subcutaneous TID WC   insulin glargine-yfgn  12 Units Subcutaneous Daily   sodium chloride flush  3 mL Intravenous Q12H   Continuous Infusions:  sodium chloride     PRN Meds: sodium chloride, hydrALAZINE, sodium chloride flush  Allergies:   No Known Allergies  Social History:   Social History   Socioeconomic History   Marital status: Single    Spouse name: Not on file   Number of children: Not on file   Years  of education: Not on file   Highest education level: Not on file  Occupational History   Not on file  Tobacco Use   Smoking status: Former   Smokeless tobacco: Never  Substance and Sexual Activity   Alcohol use: Yes    Comment: occ.   Drug use: Never   Sexual activity: Not on file  Other Topics Concern   Not on file  Social History Narrative   Not on file   Social Determinants of Health   Financial Resource Strain: Not on file  Food Insecurity: Not on file  Transportation Needs: Not on file  Physical Activity: Not on file  Stress: Not on file  Social Connections: Not on file  Intimate Partner Violence: Not on file    Family History:    Family History  Problem Relation Age of Onset   Heart disease Mother    Stroke Father      ROS:  Please see the history of present illness.   All other ROS reviewed and negative.     Physical Exam/Data:   Vitals:   11/12/20 1100 11/12/20 1130 11/12/20 1400 11/12/20 1530  BP: 135/72 104/65 131/65 125/78  Pulse: (!) 39 (!) 40 (!) 39 (!) 42  Resp: 12 12 (!) 21 14  Temp:  98 F (36.7 C)    TempSrc:  Oral    SpO2: 93% 98% 94% 95%  Weight:      Height:       No intake or output data in the 24 hours ending 11/12/20 1606 Last 3 Weights 11/11/2020 10/06/2020 03/25/2015  Weight (lbs) 220 lb 225 lb 216 lb  Weight (kg) 99.791 kg 102.059 kg 97.977 kg     Body mass index is 32.49 kg/m.  General:  Well nourished, well developed, in no acute distress HEENT: normal Neck: no JVD Vascular: No carotid bruits; Distal pulses 2+ bilaterally Cardiac: Bradycardic, regular, no murmurs Lungs:  clear to auscultation bilaterally, no wheezing, rhonchi or rales  Abd: soft, nontender, no hepatomegaly  Ext: no edema Musculoskeletal:  No deformities, BUE and BLE strength normal and equal Skin: warm and dry  Neuro:  CNs 2-12 intact, no focal abnormalities noted Psych:  Normal affect   EKG:  The EKG was personally reviewed and demonstrates:  Third-degree AV block Telemetry:  Telemetry was personally reviewed and demonstrates: Third-degree AV block  Relevant CV Studies: Echocardiogram pending  Laboratory Data:  High Sensitivity Troponin:   Recent Labs  Lab 11/11/20 2036 11/11/20 2235  TROPONINIHS 9 13     Chemistry Recent Labs  Lab 11/11/20 2036  NA 138  K 4.7  CL 105  CO2 25  GLUCOSE 230*  BUN 24*  CREATININE 1.68*  CALCIUM 9.0  MG 1.7  GFRNONAA 45*  ANIONGAP 8    No results  for input(s): PROT, ALBUMIN, AST, ALT, ALKPHOS, BILITOT in the last 168 hours. Lipids No results for input(s): CHOL, TRIG, HDL, LABVLDL, LDLCALC, CHOLHDL in the last 168 hours.  Hematology Recent Labs  Lab 11/11/20 2036  WBC 13.5*  RBC 4.29  HGB 12.7*  HCT 36.3*  MCV 84.6  MCH 29.6  MCHC 35.0  RDW 14.0  PLT 296   Thyroid  Recent Labs  Lab 11/11/20 2036  TSH 6.895*    BNPNo results for input(s): BNP, PROBNP in the last 168 hours.  DDimer No results for input(s): DDIMER in the last 168 hours.   Radiology/Studies:  DG Chest 1 View  Result Date: 11/11/2020 CLINICAL DATA:  Shortness of breath today. EXAM: CHEST  1 VIEW COMPARISON:  03/25/2015 FINDINGS: Lower lung volumes from prior exam. Upper normal heart size likely accentuated by technique. There is aortic tortuosity. Aortic atherosclerosis. Bronchovascular crowding with vascular congestion. No overt edema. No focal airspace disease. No pleural effusion or pneumothorax. No acute osseous findings. IMPRESSION: Low lung volumes with bronchovascular crowding and vascular congestion. Electronically Signed   By: Narda Rutherford M.D.   On: 11/11/2020 20:46     Assessment and Plan:   Third-degree AV block -Hold AV nodal agents, hold Coreg over the next 48 hours to allow washout. -If still in complete AV block, plan for PPM Friday at [facility]. -Discussed with EP, appreciate input. -Echocardiogram ordered, will review.  2.  Hypertension -PTA amlodipine. -Avoid AV nodal  agents.  3.  GERD -Restart PTA protonix 40mg  qd  Total encounter time 110 minutes  Greater than 50% was spent in counseling and coordination of care with the patient   Signed, , MD  11/12/2020 4:06 PM

## 2020-11-12 NOTE — ED Notes (Signed)
MD Allena Katz) notified of the patients decreased HR while sleeping - advised the patient lay on his side which improved his HR slightly.

## 2020-11-12 NOTE — Progress Notes (Signed)
Inpatient Diabetes Program Recommendations  AACE/ADA: New Consensus Statement on Inpatient Glycemic Control (2015)  Target Ranges:  Prepandial:   less than 140 mg/dL      Peak postprandial:   less than 180 mg/dL (1-2 hours)      Critically ill patients:  140 - 180 mg/dL   Lab Results  Component Value Date   GLUCAP 198 (H) 11/12/2020   HGBA1C 8.6 (H) 11/11/2020    Review of Glycemic Control Results for [name], [name] (MRN [id number]) as of 11/12/2020 14:20  Ref. Range 11/12/2020 08:10 11/12/2020 12:51  Glucose-Capillary Latest Ref Range: 70 - 99 mg/dL 637 (H) 858 (H)   Diabetes history: DM 2 Outpatient Diabetes medications:  Glipzide 10 mg daily, Tradjenta 5 mg daily Current orders for Inpatient glycemic control:  Novolog moderate tid with meals Inpatient Diabetes Program Recommendations:   May consider adding Semglee 12 units daily while in the hospital.   Thanks,  Beryl Meager, RN, BC-ADM Inpatient Diabetes Coordinator Pager [phone]  (8a-5p)

## 2020-11-12 NOTE — Progress Notes (Signed)
[facility]  - [place] at [facility]   PATIENT NAME: [name]    MR#:  [id number]  DATE OF BIRTH:  02-Jun-1955  SUBJECTIVE:   came in with shortness of breath denies syncope. Denies chest pain. Brother in the ER. Heart rate 35 to 42 REVIEW OF SYSTEMS:   Review of Systems  Constitutional:  Negative for chills, fever and weight loss.  HENT:  Negative for ear discharge, ear pain and nosebleeds.   Eyes:  Negative for blurred vision, pain and discharge.  Respiratory:  Negative for sputum production, shortness of breath, wheezing and stridor.   Cardiovascular:  Negative for chest pain, palpitations, orthopnea and PND.  Gastrointestinal:  Negative for abdominal pain, diarrhea, nausea and vomiting.  Genitourinary:  Negative for frequency and urgency.  Musculoskeletal:  Negative for back pain and joint pain.  Neurological:  Negative for sensory change, speech change, focal weakness and weakness.  Psychiatric/Behavioral:  Negative for depression and hallucinations. The patient is not nervous/anxious.   Tolerating Diet:yes Tolerating PT:   DRUG ALLERGIES:  No Known Allergies  VITALS:  Blood pressure 125/78, pulse (!) 42, temperature 98 F (36.7 C), temperature source Oral, resp. rate 14, height 5\' 9"  (1.753 m), weight 99.8 kg, SpO2 95 %.  PHYSICAL EXAMINATION:   Physical Exam  GENERAL:  65 y.o.-year-old patient lying in the bed with no acute distress.  LUNGS: Normal breath sounds bilaterally, no wheezing, rales, rhonchi. No use of accessory muscles of respiration.  CARDIOVASCULAR: S1, S2 normal. No murmurs, rubs, or gallops.  ABDOMEN: Soft, nontender, nondistended. Bowel sounds present. No organomegaly or mass.  EXTREMITIES: No cyanosis, clubbing or edema b/l.    NEUROLOGIC: Cranial nerves II through XII are intact. No focal Motor or sensory deficits b/l.   PSYCHIATRIC:  patient is alert and oriented x 3.  SKIN: No obvious rash, lesion, or ulcer.   LABORATORY  PANEL:  CBC Recent Labs  Lab 11/11/20 2036  WBC 13.5*  HGB 12.7*  HCT 36.3*  PLT 296    Chemistries  Recent Labs  Lab 11/11/20 2036  NA 138  K 4.7  CL 105  CO2 25  GLUCOSE 230*  BUN 24*  CREATININE 1.68*  CALCIUM 9.0  MG 1.7   Cardiac Enzymes No results for input(s): TROPONINI in the last 168 hours. RADIOLOGY:  DG Chest 1 View  Result Date: 11/11/2020 CLINICAL DATA:  Shortness of breath today. EXAM: CHEST  1 VIEW COMPARISON:  03/25/2015 FINDINGS: Lower lung volumes from prior exam. Upper normal heart size likely accentuated by technique. There is aortic tortuosity. Aortic atherosclerosis. Bronchovascular crowding with vascular congestion. No overt edema. No focal airspace disease. No pleural effusion or pneumothorax. No acute osseous findings. IMPRESSION: Low lung volumes with bronchovascular crowding and vascular congestion. Electronically Signed   By: 03/27/2015 M.D.   On: 11/11/2020 20:46   ASSESSMENT AND PLAN:  [name] is a 65 y.o. male with past medical history of diabetes, hypertension, sebaceous cyst, vitamin D deficient presents to the ER for evaluation of several days of worsening generalized weakness and fatigue as well as shortness of breath particular with any exertion.  Sinus bradycardia/complete heart block -- patient is asymptomatic. Heart rate 35 to 42. -- Seen by Dr. 76 EP-- recommends Coreg washout and 48 hour monitoring of heart rhythm. If no improvement patient will be [facility] to [facility] for permanent pacing likely on Friday.  Hypertension -- blood pressure is stable -- continue amlodipine  type II  diabetes without complication hyperlipidemia -- seen by diabetes coordinator recommends insulin while in house -- A1c is 8.6 -- continue Lipitor  [name] -- continue PPI   Procedures: Family communication : brother in the ER Consults : [facility] cardiology CODE STATUS: full DVT Prophylaxis : heparin Level of care:  Stepdown Status is: Inpatient  Remains inpatient appropriate because: third-degree AV block may need pacemaker        TOTAL TIME TAKING CARE OF THIS PATIENT: 25 minutes.  >50% time spent on counselling and coordination of care  Note: This dictation was prepared with Dragon dictation along with smaller phrase technology. Any transcriptional errors that result from this process are unintentional.  Enedina Finner M.D    [facility]   CC: Primary care physician; Center, [facility] Patient ID: [name], male   DOB: October 15, 1955, 65 y.o.   MRN: [id number]

## 2020-11-13 DIAGNOSIS — R001 Bradycardia, unspecified: Secondary | ICD-10-CM

## 2020-11-13 DIAGNOSIS — I10 Essential (primary) hypertension: Secondary | ICD-10-CM

## 2020-11-13 DIAGNOSIS — I442 Atrioventricular block, complete: Secondary | ICD-10-CM | POA: Diagnosis not present

## 2020-11-13 DIAGNOSIS — E119 Type 2 diabetes mellitus without complications: Secondary | ICD-10-CM | POA: Diagnosis not present

## 2020-11-13 DIAGNOSIS — R0602 Shortness of breath: Secondary | ICD-10-CM | POA: Diagnosis not present

## 2020-11-13 LAB — BASIC METABOLIC PANEL
Anion gap: 11 (ref 5–15)
BUN: 30 mg/dL — ABNORMAL HIGH (ref 8–23)
CO2: 21 mmol/L — ABNORMAL LOW (ref 22–32)
Calcium: 8.7 mg/dL — ABNORMAL LOW (ref 8.9–10.3)
Chloride: 108 mmol/L (ref 98–111)
Creatinine, Ser: 1.79 mg/dL — ABNORMAL HIGH (ref 0.61–1.24)
GFR, Estimated: 42 mL/min — ABNORMAL LOW (ref >60)
Glucose, Bld: 272 mg/dL — ABNORMAL HIGH (ref 70–99)
Potassium: 4.1 mmol/L (ref 3.5–5.1)
Sodium: 140 mmol/L (ref 135–145)

## 2020-11-13 LAB — GLUCOSE, CAPILLARY
Glucose-Capillary: 285 mg/dL — ABNORMAL HIGH (ref 70–99)
Glucose-Capillary: 200 mg/dL — ABNORMAL HIGH (ref 70–99)
Glucose-Capillary: 271 mg/dL — ABNORMAL HIGH (ref 70–99)
Glucose-Capillary: 149 mg/dL — ABNORMAL HIGH (ref 70–99)

## 2020-11-13 LAB — T4, FREE: Free T4: 1.1 ng/dL (ref 0.61–1.12)

## 2020-11-13 MED ORDER — BENZONATATE 100 MG PO CAPS
100.0000 mg | ORAL_CAPSULE | Freq: Three times a day (TID) | ORAL | Status: DC
  Administered 2020-11-13 – 2020-11-15 (×7): 100 mg via ORAL
  Filled 2020-11-13 (×7): qty 1

## 2020-11-13 MED ORDER — CHLORHEXIDINE GLUCONATE CLOTH 2 % EX PADS
6.0000 | MEDICATED_PAD | Freq: Every day | CUTANEOUS | Status: DC
  Administered 2020-11-14: 6 via TOPICAL

## 2020-11-13 MED ORDER — INSULIN GLARGINE-YFGN 100 UNIT/ML ~~LOC~~ SOLN
16.0000 [IU] | Freq: Every day | SUBCUTANEOUS | Status: DC
  Administered 2020-11-14 – 2020-11-15 (×2): 16 [IU] via SUBCUTANEOUS
  Filled 2020-11-13 (×2): qty 0.16

## 2020-11-13 MED ORDER — IPRATROPIUM-ALBUTEROL 0.5-2.5 (3) MG/3ML IN SOLN: 3.0000 mL | Freq: Four times a day (QID) | RESPIRATORY_TRACT | Status: DC | PRN

## 2020-11-13 MED ORDER — ONDANSETRON HCL 4 MG/2ML IJ SOLN
4.0000 mg | Freq: Four times a day (QID) | INTRAMUSCULAR | Status: DC | PRN
  Administered 2020-11-13: 4 mg via INTRAVENOUS
  Filled 2020-11-13: qty 2

## 2020-11-13 NOTE — Progress Notes (Signed)
Consistent nonproductive cough noted this AM. Tessalon ordered and given. Cough improved.   N/V x2 this AM. PRN Zofran ordered and given x1 w/ positive results.   Near 1200, pt began to desat into 70s-80s. I placed pt on 3L [place] and switched o2 probe from finger to ear. Pt now Sat'ing low to mid 90s. Pt is tachypneic, but does not endorse air hunger or difficulty breathing.   Pt stable on 3L [place].   Pt remains in SB 3rd deg heart block.   BP stable.

## 2020-11-13 NOTE — Progress Notes (Signed)
Progress Note  Patient Name: [name] Date of Encounter: 11/13/2020  Primary Cardiologist: Agbor-Etang  Subjective   No chest pain or worsening shortness of breath.  He remains in complete heart block with good ventricular escape rhythm.  Inpatient Medications    Scheduled Meds:  amLODipine  10 mg Oral Daily   aspirin EC  81 mg Oral Daily   atorvastatin  40 mg Oral Daily   benzonatate  100 mg Oral TID   heparin  5,000 Units Subcutaneous Q8H   insulin aspart  0-15 Units Subcutaneous TID WC   insulin glargine-yfgn  12 Units Subcutaneous Daily   sodium chloride flush  3 mL Intravenous Q12H   Continuous Infusions:  sodium chloride     PRN Meds: sodium chloride, hydrALAZINE, ipratropium-albuterol, ondansetron (ZOFRAN) IV, sodium chloride flush   Vital Signs    Vitals:   11/13/20 1000 11/13/20 1100 11/13/20 1200 11/13/20 1208  BP:  (!) 149/93 (!) 148/62   Pulse: (!) 42 (!) 48 (!) 45 (!) 43  Resp: (!) 22 (!) 23 (!) 30 (!) 33  Temp:      TempSrc:      SpO2: 92% (!) 87% (S) (!) 74% (S) 95%  Weight:      Height:        Intake/Output Summary (Last 24 hours) at 11/13/2020 1234 Last data filed at 11/13/2020 0900 Gross per 24 hour  Intake 60 ml  Output 500 ml  Net -440 ml   Filed Weights   11/11/20 2025  Weight: 99.8 kg    Telemetry    Complete heart block with good ventricular escape rhythm - Personally Reviewed  ECG    No new tracings - Personally Reviewed  Physical Exam   GEN: No acute distress.   Neck: No JVD. Cardiac: Bradycardic, no murmurs, rubs, or gallops.  Respiratory: Clear to auscultation bilaterally.  GI: Soft, nontender, non-distended.   MS: No edema; No deformity. Neuro:  Alert and oriented x 3; Nonfocal.  Psych: Normal affect.  Labs    Chemistry Recent Labs  Lab 11/11/20 2036 11/13/20 0505  NA 138 140  K 4.7 4.1  CL 105 108  CO2 25 21*  GLUCOSE 230* 272*  BUN 24* 30*  CREATININE 1.68* 1.79*  CALCIUM 9.0 8.7*   GFRNONAA 45* 42*  ANIONGAP 8 11     Hematology Recent Labs  Lab 11/11/20 2036  WBC 13.5*  RBC 4.29  HGB 12.7*  HCT 36.3*  MCV 84.6  MCH 29.6  MCHC 35.0  RDW 14.0  PLT 296    Cardiac EnzymesNo results for input(s): TROPONINI in the last 168 hours. No results for input(s): TROPIPOC in the last 168 hours.   BNPNo results for input(s): BNP, PROBNP in the last 168 hours.   DDimer No results for input(s): DDIMER in the last 168 hours.   Radiology    DG Chest 1 View  Result Date: 11/11/2020 IMPRESSION: Low lung volumes with bronchovascular crowding and vascular congestion. Electronically Signed   By: Narda Rutherford M.D.   On: 11/11/2020 20:46   Cardiac Studies   2D echo 11/12/2020: 1. Left ventricular ejection fraction, by estimation, is 65 to 70%. The  left ventricle has normal function. The left ventricle has no regional  wall motion abnormalities. There is mild left ventricular hypertrophy.  Left ventricular diastolic parameters  were normal.   2. Right ventricular systolic function is normal. The right ventricular  size is normal.   3. Left atrial  size was mildly dilated.   4. The mitral valve is normal in structure. No evidence of mitral valve  regurgitation.   5. The aortic valve is tricuspid. Aortic valve regurgitation is not  visualized.  Patient Profile     65 y.o. male with history of HTN and DM who we are seeing for complete heart block.  Assessment & Plan    1. Complete heart block: -Hemodynamically stable with good escape rhythm -Evaluated by EP with recommendation to continue Coreg washout with plans for pacemaker if high grade AV block persists at [facility] on Friday -TSH mildly elevated, check free T4 -Recommend pacer pads at bedside -Atropine prn -No current indication for emergent venous temp wire or pacemaker at this time -If he becomes hemodynamically unstable, could use dopamine gtt  2. HTN: -Blood pressure reasonable -Degree of permissive  hypertension given bradycardia  -Avoid AV nodal blocking medications -PTA amlodipine   For questions or updates, please contact [facility] Please consult [email or web address] for contact info under Cardiology/STEMI.    Signed, Eula Listen, PA-C [facility] Pager: [phone] 11/13/2020, 12:34 PM

## 2020-11-13 NOTE — Progress Notes (Signed)
Inpatient Diabetes Program Recommendations  AACE/ADA: New Consensus Statement on Inpatient Glycemic Control (2015)  Target Ranges:  Prepandial:   less than 140 mg/dL      Peak postprandial:   less than 180 mg/dL (1-2 hours)      Critically ill patients:  140 - 180 mg/dL   Lab Results  Component Value Date   GLUCAP 200 (H) 11/13/2020   HGBA1C 8.6 (H) 11/11/2020    Review of Glycemic Control Results for [name], [name] (MRN [id number]) as of 11/13/2020 14:01  Ref. Range 11/12/2020 16:34 11/12/2020 21:06 11/12/2020 22:19 11/13/2020 07:25 11/13/2020 11:55  Glucose-Capillary Latest Ref Range: 70 - 99 mg/dL 201 (H) 007 (H) 121 (H) 285 (H) 200 (H)   Diabetes history: DM 2 Outpatient Diabetes medications:  Glipzide 10 mg daily, Tradjenta 5 mg daily Current orders for Inpatient glycemic control:  Novolog moderate tid with meals Semglee 12 units daily Inpatient Diabetes Program Recommendations:   May consider increasing Semglee to 16 units daily.   Thanks,  Beryl Meager, RN, BC-ADM Inpatient Diabetes Coordinator Pager [phone]  (8a-5p)

## 2020-11-13 NOTE — Progress Notes (Signed)
[facility]  - [place] at [facility]   PATIENT NAME: [name]    MR#:  [id number]  DATE OF BIRTH:  1955-03-15  SUBJECTIVE:   came in with shortness of breath denies syncope. Denies chest pain. Heart rate 35 to 42 Dry cough for 1-2 days REVIEW OF SYSTEMS:   Review of Systems  Constitutional:  Negative for chills, fever and weight loss.  HENT:  Negative for ear discharge, ear pain and nosebleeds.   Eyes:  Negative for blurred vision, pain and discharge.  Respiratory:  Positive for cough. Negative for sputum production, shortness of breath, wheezing and stridor.   Cardiovascular:  Negative for chest pain, palpitations, orthopnea and PND.  Gastrointestinal:  Negative for abdominal pain, diarrhea, nausea and vomiting.  Genitourinary:  Negative for frequency and urgency.  Musculoskeletal:  Negative for back pain and joint pain.  Neurological:  Positive for weakness. Negative for sensory change, speech change and focal weakness.  Psychiatric/Behavioral:  Negative for depression and hallucinations. The patient is not nervous/anxious.   Tolerating Diet:yes Tolerating PT:   DRUG ALLERGIES:  No Known Allergies  VITALS:  Blood pressure (!) 136/36, pulse (!) 44, temperature 99 F (37.2 C), temperature source Oral, resp. rate 18, height 5\' 9"  (1.753 m), weight 99.8 kg, SpO2 92 %.  PHYSICAL EXAMINATION:   Physical Exam  GENERAL:  65 y.o.-year-old patient lying in the bed with no acute distress.  LUNGS: Normal breath sounds bilaterally, no wheezing, rales, rhonchi. No use of accessory muscles of respiration.  CARDIOVASCULAR: S1, S2 normal. No murmurs, rubs, or gallops.  ABDOMEN: Soft, nontender, nondistended. Bowel sounds present. No organomegaly or mass.  EXTREMITIES: No cyanosis, clubbing or edema b/l.    NEUROLOGIC: Cranial nerves II through XII are intact. No focal Motor or sensory deficits b/l.   PSYCHIATRIC:  patient is alert and oriented x 3.  SKIN: No obvious  rash, lesion, or ulcer.   LABORATORY PANEL:  CBC Recent Labs  Lab 11/11/20 2036  WBC 13.5*  HGB 12.7*  HCT 36.3*  PLT 296     Chemistries  Recent Labs  Lab 11/11/20 2036 11/13/20 0505  NA 138 140  K 4.7 4.1  CL 105 108  CO2 25 21*  GLUCOSE 230* 272*  BUN 24* 30*  CREATININE 1.68* 1.79*  CALCIUM 9.0 8.7*  MG 1.7  --     Cardiac Enzymes No results for input(s): TROPONINI in the last 168 hours. RADIOLOGY:  DG Chest 1 View  Result Date: 11/11/2020 CLINICAL DATA:  Shortness of breath today. EXAM: CHEST  1 VIEW COMPARISON:  03/25/2015 FINDINGS: Lower lung volumes from prior exam. Upper normal heart size likely accentuated by technique. There is aortic tortuosity. Aortic atherosclerosis. Bronchovascular crowding with vascular congestion. No overt edema. No focal airspace disease. No pleural effusion or pneumothorax. No acute osseous findings. IMPRESSION: Low lung volumes with bronchovascular crowding and vascular congestion. Electronically Signed   By: Narda Rutherford M.D.   On: 11/11/2020 20:46   ECHOCARDIOGRAM COMPLETE  Result Date: 11/12/2020    ECHOCARDIOGRAM REPORT   Patient Name:   [name] Date of Exam: 11/12/2020 Medical Rec #:  [id number]        Height:       69.0 in Accession #:    [id number]       Weight:       220.0 lb Date of Birth:  [birth year]         BSA:  2.151 m Patient Age:    65 years         BP:           104/65 mmHg Patient Gender: M                HR:           40 bpm. Exam Location:  [facility] Procedure: 2D Echo, Cardiac Doppler and Color Doppler Indications:     Heart block, complete I44.2  History:         Patient has no prior history of Echocardiogram examinations.                  Risk Factors:Hypertension and Diabetes.  Sonographer:     Cristela Blue Referring Phys:  [id number] Debbe Odea Diagnosing Phys: Debbe Odea MD  Sonographer Comments: Suboptimal apical window. IMPRESSIONS  1. Left ventricular ejection fraction, by estimation, is 65  to 70%. The left ventricle has normal function. The left ventricle has no regional wall motion abnormalities. There is mild left ventricular hypertrophy. Left ventricular diastolic parameters were normal.  2. Right ventricular systolic function is normal. The right ventricular size is normal.  3. Left atrial size was mildly dilated.  4. The mitral valve is normal in structure. No evidence of mitral valve regurgitation.  5. The aortic valve is tricuspid. Aortic valve regurgitation is not visualized. FINDINGS  Left Ventricle: Left ventricular ejection fraction, by estimation, is 65 to 70%. The left ventricle has normal function. The left ventricle has no regional wall motion abnormalities. The left ventricular internal cavity size was normal in size. There is  mild left ventricular hypertrophy. Left ventricular diastolic parameters were normal. Right Ventricle: The right ventricular size is normal. No increase in right ventricular wall thickness. Right ventricular systolic function is normal. Left Atrium: Left atrial size was mildly dilated. Right Atrium: Right atrial size was normal in size. Pericardium: There is no evidence of pericardial effusion. Mitral Valve: The mitral valve is normal in structure. No evidence of mitral valve regurgitation. MV peak gradient, 7.0 mmHg. The mean mitral valve gradient is 2.0 mmHg. Tricuspid Valve: The tricuspid valve is normal in structure. Tricuspid valve regurgitation is not demonstrated. Aortic Valve: The aortic valve is tricuspid. Aortic valve regurgitation is not visualized. Aortic valve mean gradient measures 3.0 mmHg. Aortic valve peak gradient measures 6.7 mmHg. Aortic valve area, by VTI measures 3.21 cm. Pulmonic Valve: The pulmonic valve was not well visualized. Pulmonic valve regurgitation is not visualized. Aorta: The aortic root is normal in size and structure. Venous: The inferior vena cava was not well visualized. IAS/Shunts: No atrial level shunt detected by color  flow Doppler.  LEFT VENTRICLE PLAX 2D LVIDd:         4.51 cm   Diastology LVIDs:         2.75 cm   LV e' medial:    12.70 cm/s LV PW:         1.28 cm   LV E/e' medial:  6.3 LV IVS:        1.10 cm   LV e' lateral:   9.68 cm/s LVOT diam:     2.00 cm   LV E/e' lateral: 8.3 LV SV:         74 LV SV Index:   34 LVOT Area:     3.14 cm  RIGHT VENTRICLE RV Basal diam:  3.25 cm RV S prime:     18.60 cm/s TAPSE (M-mode): 3.9 cm LEFT ATRIUM  Index        RIGHT ATRIUM           Index LA diam:        4.90 cm 2.28 cm/m   RA Area:     10.90 cm LA Vol (A2C):   95.5 ml 44.39 ml/m  RA Volume:   21.20 ml  9.85 ml/m LA Vol (A4C):   66.5 ml 30.91 ml/m LA Biplane Vol: 83.3 ml 38.72 ml/m  AORTIC VALVE                    PULMONIC VALVE AV Area (Vmax):    3.07 cm     PV Vmax:        0.69 m/s AV Area (Vmean):   3.10 cm     PV Vmean:       41.000 cm/s AV Area (VTI):     3.21 cm     PV VTI:         0.135 m AV Vmax:           129.00 cm/s  PV Peak grad:   1.9 mmHg AV Vmean:          82.300 cm/s  PV Mean grad:   1.0 mmHg AV VTI:            0.230 m      RVOT Peak grad: 3 mmHg AV Peak Grad:      6.7 mmHg AV Mean Grad:      3.0 mmHg LVOT Vmax:         126.00 cm/s LVOT Vmean:        81.200 cm/s LVOT VTI:          0.235 m LVOT/AV VTI ratio: 1.02  AORTA Ao Root diam: 2.80 cm MITRAL VALVE               TRICUSPID VALVE MV Area (PHT): 3.58 cm    TR Peak grad:   14.4 mmHg MV Area VTI:   2.43 cm    TR Vmax:        190.00 cm/s MV Peak grad:  7.0 mmHg MV Mean grad:  2.0 mmHg    SHUNTS MV Vmax:       1.32 m/s    Systemic VTI:  0.24 m MV Vmean:      55.3 cm/s   Systemic Diam: 2.00 cm MV Decel Time: 212 msec    Pulmonic VTI:  0.184 m MV E velocity: 80.60 cm/s MV A velocity: 57.00 cm/s MV E/A ratio:  1.41 Debbe Odea MD Electronically signed by Debbe Odea MD Signature Date/Time: 11/12/2020/6:19:11 PM    Final    ASSESSMENT AND PLAN:  [name] is a 65 y.o. male with past medical history of diabetes, hypertension,  sebaceous cyst, vitamin D deficient presents to the ER for evaluation of several days of worsening generalized weakness and fatigue as well as shortness of breath particular with any exertion.  Sinus bradycardia/complete heart block -- patient is asymptomatic. Heart rate 35 to 42. -- Seen by Dr. Lalla Brothers EP-- recommends Coreg washout and 48 hour monitoring of heart rhythm. If no improvement patient will be [facility] to [facility] for permanent pacing likely on Friday.  Hypertension -- blood pressure is stable -- continue amlodipine  type II diabetes without complication hyperlipidemia -- seen by diabetes coordinator recommends insulin while in house -- A1c is 8.6 -- continue Lipitor  Gerd -- continue PPI  Cough --cxr no PNA some  bronchial crowding. Will try cough meds. No fever   Procedures: Family communication : brother in the ER 11/2 Consults : [facility] cardiology CODE STATUS: full DVT Prophylaxis : heparin Level of care: Stepdown Status is: Inpatient  Remains inpatient appropriate because: third-degree AV block may need pacemaker        TOTAL TIME TAKING CARE OF THIS PATIENT: 25 minutes.  >50% time spent on counselling and coordination of care  Note: This dictation was prepared with Dragon dictation along with smaller phrase technology. Any transcriptional errors that result from this process are unintentional.  Enedina Finner M.D    [facility]   CC: Primary care physician; Center, [facility] Patient ID: [name], male   DOB: 08-Feb-1955, 65 y.o.   MRN: [id number]

## 2020-11-14 ENCOUNTER — Other Ambulatory Visit: Payer: Self-pay | Admitting: *Deleted

## 2020-11-14 ENCOUNTER — Inpatient Hospital Stay (INDEPENDENT_AMBULATORY_CARE_PROVIDER_SITE_OTHER): Payer: Medicare (Managed Care)

## 2020-11-14 ENCOUNTER — Encounter: Payer: Self-pay | Admitting: Cardiovascular Disease

## 2020-11-14 DIAGNOSIS — I442 Atrioventricular block, complete: Secondary | ICD-10-CM

## 2020-11-14 DIAGNOSIS — E119 Type 2 diabetes mellitus without complications: Secondary | ICD-10-CM | POA: Diagnosis not present

## 2020-11-14 DIAGNOSIS — R531 Weakness: Secondary | ICD-10-CM

## 2020-11-14 DIAGNOSIS — R0602 Shortness of breath: Secondary | ICD-10-CM | POA: Diagnosis not present

## 2020-11-14 DIAGNOSIS — I10 Essential (primary) hypertension: Secondary | ICD-10-CM | POA: Diagnosis not present

## 2020-11-14 DIAGNOSIS — R001 Bradycardia, unspecified: Secondary | ICD-10-CM | POA: Diagnosis not present

## 2020-11-14 LAB — GLUCOSE, CAPILLARY
Glucose-Capillary: 167 mg/dL — ABNORMAL HIGH (ref 70–99)
Glucose-Capillary: 313 mg/dL — ABNORMAL HIGH (ref 70–99)
Glucose-Capillary: 214 mg/dL — ABNORMAL HIGH (ref 70–99)
Glucose-Capillary: 197 mg/dL — ABNORMAL HIGH (ref 70–99)

## 2020-11-14 LAB — CBC WITH DIFFERENTIAL/PLATELET
Abs Immature Granulocytes: 0.03 10*3/uL (ref 0.00–0.07)
Basophils Absolute: 0 10*3/uL (ref 0.0–0.1)
Basophils Relative: 0 %
Eosinophils Absolute: 0.2 10*3/uL (ref 0.0–0.5)
Eosinophils Relative: 2 %
HCT: 32.7 % — ABNORMAL LOW (ref 39.0–52.0)
Hemoglobin: 11.4 g/dL — ABNORMAL LOW (ref 13.0–17.0)
Immature Granulocytes: 0 %
Lymphocytes Relative: 33 %
Lymphs Abs: 3.4 10*3/uL (ref 0.7–4.0)
MCH: 29.6 pg (ref 26.0–34.0)
MCHC: 34.9 g/dL (ref 30.0–36.0)
MCV: 84.9 fL (ref 80.0–100.0)
Monocytes Absolute: 1 10*3/uL (ref 0.1–1.0)
Monocytes Relative: 9 %
Neutro Abs: 5.8 10*3/uL (ref 1.7–7.7)
Neutrophils Relative %: 56 %
Platelets: 222 10*3/uL (ref 150–400)
RBC: 3.85 MIL/uL — ABNORMAL LOW (ref 4.22–5.81)
RDW: 14.3 % (ref 11.5–15.5)
WBC: 10.3 10*3/uL (ref 4.0–10.5)
nRBC: 0 % (ref 0.0–0.2)

## 2020-11-14 LAB — BASIC METABOLIC PANEL
Anion gap: 8 (ref 5–15)
BUN: 36 mg/dL — ABNORMAL HIGH (ref 8–23)
CO2: 24 mmol/L (ref 22–32)
Calcium: 8.4 mg/dL — ABNORMAL LOW (ref 8.9–10.3)
Chloride: 107 mmol/L (ref 98–111)
Creatinine, Ser: 2.33 mg/dL — ABNORMAL HIGH (ref 0.61–1.24)
GFR, Estimated: 30 mL/min — ABNORMAL LOW (ref >60)
Glucose, Bld: 167 mg/dL — ABNORMAL HIGH (ref 70–99)
Potassium: 4.2 mmol/L (ref 3.5–5.1)
Sodium: 139 mmol/L (ref 135–145)

## 2020-11-14 LAB — MAGNESIUM: Magnesium: 2 mg/dL (ref 1.7–2.4)

## 2020-11-14 NOTE — Progress Notes (Signed)
[facility]  - [place] [facility]   PATIENT NAME: [name]    MR#:  [id number]  DATE OF BIRTH:  06/09/1955  SUBJECTIVE:   patient ambulated earlier in the ICU with walker. Heart rate jumped up to the 50s to 60s. Dr. Mariah Milling in the room earlier. REVIEW OF SYSTEMS:   Review of Systems  Constitutional:  Negative for chills, fever and weight loss.  HENT:  Negative for ear discharge, ear pain and nosebleeds.   Eyes:  Negative for blurred vision, pain and discharge.  Respiratory:  Negative for sputum production, shortness of breath, wheezing and stridor.   Cardiovascular:  Negative for chest pain, palpitations, orthopnea and PND.  Gastrointestinal:  Negative for abdominal pain, diarrhea, nausea and vomiting.  Genitourinary:  Negative for frequency and urgency.  Musculoskeletal:  Negative for back pain and joint pain.  Neurological:  Positive for weakness. Negative for sensory change, speech change and focal weakness.  Psychiatric/Behavioral:  Negative for depression and hallucinations. The patient is not nervous/anxious.   Tolerating Diet:yes Tolerating PT:   DRUG ALLERGIES:  No Known Allergies  VITALS:  Blood pressure 124/66, pulse (!) 50, temperature 98.4 F (36.9 C), temperature source Axillary, resp. rate (!) 65, height 5\' 9"  (1.753 m), weight 99.8 kg, SpO2 96 %.  PHYSICAL EXAMINATION:   Physical Exam  GENERAL:  65 y.o.-year-old patient lying in the bed with no acute distress.  LUNGS: Normal breath sounds bilaterally, no wheezing, rales, rhonchi. No use of accessory muscles of respiration.  CARDIOVASCULAR: S1, S2 normal. No murmurs, rubs, or gallops.  ABDOMEN: Soft, nontender, nondistended. Bowel sounds present. No organomegaly or mass.  EXTREMITIES: No cyanosis, clubbing or edema b/l.    NEUROLOGIC: Cranial nerves II through XII are intact. No focal Motor or sensory deficits b/l.   PSYCHIATRIC:  patient is alert and oriented x 3.  SKIN: No obvious  rash, lesion, or ulcer.   LABORATORY PANEL:  CBC Recent Labs  Lab 11/14/20 0455  WBC 10.3  HGB 11.4*  HCT 32.7*  PLT 222     Chemistries  Recent Labs  Lab 11/14/20 0455  NA 139  K 4.2  CL 107  CO2 24  GLUCOSE 167*  BUN 36*  CREATININE 2.33*  CALCIUM 8.4*  MG 2.0    Cardiac Enzymes No results for input(s): TROPONINI in the last 168 hours. RADIOLOGY:  ECHOCARDIOGRAM COMPLETE  Result Date: 11/12/2020    ECHOCARDIOGRAM REPORT   Patient Name:   [name] Date of Exam: 11/12/2020 Medical Rec #:  13/02/[id number]        Height:       69.0 in Accession #:    [id number]       Weight:       220.0 lb Date of Birth:  1955-06-10         BSA:          2.151 m Patient Age:    65 years         BP:           104/65 mmHg Patient Gender: M                HR:           40 bpm. Exam Location:  [facility] Procedure: 2D Echo, Cardiac Doppler and Color Doppler Indications:     Heart block, complete I44.2  History:         Patient has no prior history of Echocardiogram examinations.  Risk Factors:Hypertension and Diabetes.  Sonographer:     Cristela Blue Referring Phys:  [id number] Debbe Odea Diagnosing Phys: Debbe Odea MD  Sonographer Comments: Suboptimal apical window. IMPRESSIONS  1. Left ventricular ejection fraction, by estimation, is 65 to 70%. The left ventricle has normal function. The left ventricle has no regional wall motion abnormalities. There is mild left ventricular hypertrophy. Left ventricular diastolic parameters were normal.  2. Right ventricular systolic function is normal. The right ventricular size is normal.  3. Left atrial size was mildly dilated.  4. The mitral valve is normal in structure. No evidence of mitral valve regurgitation.  5. The aortic valve is tricuspid. Aortic valve regurgitation is not visualized. FINDINGS  Left Ventricle: Left ventricular ejection fraction, by estimation, is 65 to 70%. The left ventricle has normal function. The left ventricle has no  regional wall motion abnormalities. The left ventricular internal cavity size was normal in size. There is  mild left ventricular hypertrophy. Left ventricular diastolic parameters were normal. Right Ventricle: The right ventricular size is normal. No increase in right ventricular wall thickness. Right ventricular systolic function is normal. Left Atrium: Left atrial size was mildly dilated. Right Atrium: Right atrial size was normal in size. Pericardium: There is no evidence of pericardial effusion. Mitral Valve: The mitral valve is normal in structure. No evidence of mitral valve regurgitation. MV peak gradient, 7.0 mmHg. The mean mitral valve gradient is 2.0 mmHg. Tricuspid Valve: The tricuspid valve is normal in structure. Tricuspid valve regurgitation is not demonstrated. Aortic Valve: The aortic valve is tricuspid. Aortic valve regurgitation is not visualized. Aortic valve mean gradient measures 3.0 mmHg. Aortic valve peak gradient measures 6.7 mmHg. Aortic valve area, by VTI measures 3.21 cm. Pulmonic Valve: The pulmonic valve was not well visualized. Pulmonic valve regurgitation is not visualized. Aorta: The aortic root is normal in size and structure. Venous: The inferior vena cava was not well visualized. IAS/Shunts: No atrial level shunt detected by color flow Doppler.  LEFT VENTRICLE PLAX 2D LVIDd:         4.51 cm   Diastology LVIDs:         2.75 cm   LV e' medial:    12.70 cm/s LV PW:         1.28 cm   LV E/e' medial:  6.3 LV IVS:        1.10 cm   LV e' lateral:   9.68 cm/s LVOT diam:     2.00 cm   LV E/e' lateral: 8.3 LV SV:         74 LV SV Index:   34 LVOT Area:     3.14 cm  RIGHT VENTRICLE RV Basal diam:  3.25 cm RV S prime:     18.60 cm/s TAPSE (M-mode): 3.9 cm LEFT ATRIUM             Index        RIGHT ATRIUM           Index LA diam:        4.90 cm 2.28 cm/m   RA Area:     10.90 cm LA Vol (A2C):   95.5 ml 44.39 ml/m  RA Volume:   21.20 ml  9.85 ml/m LA Vol (A4C):   66.5 ml 30.91 ml/m LA  Biplane Vol: 83.3 ml 38.72 ml/m  AORTIC VALVE                    PULMONIC VALVE AV Area (  Vmax):    3.07 cm     PV Vmax:        0.69 m/s AV Area (Vmean):   3.10 cm     PV Vmean:       41.000 cm/s AV Area (VTI):     3.21 cm     PV VTI:         0.135 m AV Vmax:           129.00 cm/s  PV Peak grad:   1.9 mmHg AV Vmean:          82.300 cm/s  PV Mean grad:   1.0 mmHg AV VTI:            0.230 m      RVOT Peak grad: 3 mmHg AV Peak Grad:      6.7 mmHg AV Mean Grad:      3.0 mmHg LVOT Vmax:         126.00 cm/s LVOT Vmean:        81.200 cm/s LVOT VTI:          0.235 m LVOT/AV VTI ratio: 1.02  AORTA Ao Root diam: 2.80 cm MITRAL VALVE               TRICUSPID VALVE MV Area (PHT): 3.58 cm    TR Peak grad:   14.4 mmHg MV Area VTI:   2.43 cm    TR Vmax:        190.00 cm/s MV Peak grad:  7.0 mmHg MV Mean grad:  2.0 mmHg    SHUNTS MV Vmax:       1.32 m/s    Systemic VTI:  0.24 m MV Vmean:      55.3 cm/s   Systemic Diam: 2.00 cm MV Decel Time: 212 msec    Pulmonic VTI:  0.184 m MV E velocity: 80.60 cm/s MV A velocity: 57.00 cm/s MV E/A ratio:  1.41 Debbe Odea MD Electronically signed by Debbe Odea MD Signature Date/Time: 11/12/2020/6:19:11 PM    Final    ASSESSMENT AND PLAN:  [name] is a 65 y.o. male with past medical history of diabetes, hypertension, sebaceous cyst, vitamin D deficient presents to the ER for evaluation of several days of worsening generalized weakness and fatigue as well as shortness of breath particular with any exertion.  Sinus bradycardia/complete heart block -- patient is asymptomatic. Heart rate 35 to 42. -- Seen by Dr. Lalla Brothers EP-- recommends Coreg washout and 48 hour monitoring of heart rhythm. If no improvement [facility] for permanent pacing likely on Friday. --11/4-- heart rate 4247 resting. Walks with walker and heart rate went up to the 60s. Dr. Mariah Milling does not feel this is complete heart block. Per cardiology-- recommends Zio  patch which will be mailed to patient's house. Monitor another 24 hours. No indication for pacemaker  Hypertension -- blood pressure is stable -- continue amlodipine  type II diabetes without complication hyperlipidemia -- seen by diabetes coordinator recommends insulin while in house -- A1c is 8.6 -- continue Lipitor  Gerd -- continue PPI  Cough --cxr no PNA some bronchial crowding. Will try cough meds. No fever   Procedures: Family communication : brother in the ER 11/2 Consults : [facility] cardiology CODE STATUS: full DVT Prophylaxis : heparin Level of care: Stepdown Status is: Inpatient  Remains inpatient appropriate because: third-degree AV block may need pacemaker        TOTAL TIME TAKING  CARE OF THIS PATIENT: 25 minutes.  >50% time spent on counselling and coordination of care  Note: This dictation was prepared with Dragon dictation along with smaller phrase technology. Any transcriptional errors that result from this process are unintentional.  Enedina Finner M.D    [facility]   CC: Primary care physician; [facility] Patient ID: [name], male   DOB: 1955/12/12, 65 y.o.   MRN: [id number]

## 2020-11-14 NOTE — Progress Notes (Signed)
After discussion between Drs. Mariah Milling and Lalla Brothers, plan is to discharge the patient home and have him wear a Zio patch.  Given he will likely go home over the weekend, and with Zio patch is not being placed at that time, recommendation has been made to have our office mail it to him.  Our office staff has been notified of this.

## 2020-11-14 NOTE — Progress Notes (Signed)
Pt ambulated w/ Dr. Allena Katz and Cardiology team @ bedside this AM. Slight increase in HR, not sustained.   This afternoon near 1500, pts HR increased to 70s-80s and sustained. Dr. Allena Katz and Cards team notified.   Pt remains on room air. Using urinal.   Bath completed this evening.   VSS.

## 2020-11-14 NOTE — Progress Notes (Signed)
Progress Note  Patient Name: [name] Date of Encounter: 11/14/2020  Primary Cardiologist: Agbor-Etang  Subjective   No chest pain, dyspnea, dizziness, presyncope, or syncope. Feels weak. In 2:1 AV block this morning with rates in the 40s bpm. With ambulation, heart rates improved to the 60s bpm.   Inpatient Medications    Scheduled Meds:  amLODipine  10 mg Oral Daily   aspirin EC  81 mg Oral Daily   atorvastatin  40 mg Oral Daily   benzonatate  100 mg Oral TID   Chlorhexidine Gluconate Cloth  6 each Topical Daily   heparin  5,000 Units Subcutaneous Q8H   insulin aspart  0-15 Units Subcutaneous TID WC   insulin glargine-yfgn  16 Units Subcutaneous Daily   sodium chloride flush  3 mL Intravenous Q12H   Continuous Infusions:  sodium chloride     PRN Meds: sodium chloride, hydrALAZINE, ipratropium-albuterol, ondansetron (ZOFRAN) IV, sodium chloride flush   Vital Signs    Vitals:   11/14/20 0632 11/14/20 0643 11/14/20 0644 11/14/20 0700  BP:    (!) 117/57  Pulse:    (!) 41  Resp:    19  Temp:      TempSrc:      SpO2: 100% 98% 98% 96%  Weight:      Height:        Intake/Output Summary (Last 24 hours) at 11/14/2020 0816 Last data filed at 11/14/2020 0200 Gross per 24 hour  Intake 300 ml  Output 400 ml  Net -100 ml    Filed Weights   11/11/20 2025  Weight: 99.8 kg    Telemetry    2:1 AV block in the 40s bpm at baseline, improved to the 60s bpm with ambulation - Personally Reviewed  ECG    No new tracings - Personally Reviewed  Physical Exam   GEN: No acute distress.   Neck: No JVD. Cardiac: Bradycardic, no murmurs, rubs, or gallops.  Respiratory: Clear to auscultation bilaterally.  GI: Soft, nontender, non-distended.   MS: No edema; No deformity. Neuro:  Alert and oriented x 3; Nonfocal.  Psych: Normal affect.  Labs    Chemistry Recent Labs  Lab 11/11/20 2036 11/13/20 0505 11/14/20 0455  NA 138 140 139  K 4.7 4.1 4.2  CL 105  108 107  CO2 25 21* 24  GLUCOSE 230* 272* 167*  BUN 24* 30* 36*  CREATININE 1.68* 1.79* 2.33*  CALCIUM 9.0 8.7* 8.4*  GFRNONAA 45* 42* 30*  ANIONGAP 8 11 8       Hematology Recent Labs  Lab 11/11/20 2036 11/14/20 0455  WBC 13.5* 10.3  RBC 4.29 3.85*  HGB 12.7* 11.4*  HCT 36.3* 32.7*  MCV 84.6 84.9  MCH 29.6 29.6  MCHC 35.0 34.9  RDW 14.0 14.3  PLT 296 222     Cardiac EnzymesNo results for input(s): TROPONINI in the last 168 hours. No results for input(s): TROPIPOC in the last 168 hours.   BNPNo results for input(s): BNP, PROBNP in the last 168 hours.   DDimer No results for input(s): DDIMER in the last 168 hours.   Radiology    DG Chest 1 View  Result Date: 11/11/2020 IMPRESSION: Low lung volumes with bronchovascular crowding and vascular congestion. Electronically Signed   By: Keith Rake M.D.   On: 11/11/2020 20:46   Cardiac Studies   2D echo 11/12/2020: 1. Left ventricular ejection fraction, by estimation, is 65 to 70%. The  left ventricle has normal function. The  left ventricle has no regional  wall motion abnormalities. There is mild left ventricular hypertrophy.  Left ventricular diastolic parameters  were normal.   2. Right ventricular systolic function is normal. The right ventricular  size is normal.   3. Left atrial size was mildly dilated.   4. The mitral valve is normal in structure. No evidence of mitral valve  regurgitation.   5. The aortic valve is tricuspid. Aortic valve regurgitation is not  visualized.  Patient Profile     65 y.o. 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  Assessment & Plan    1. 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 Mariah Milling is reaching out to EP for further recommendations regarding possible transfer to [facility] and [facility] -TSH mildly elevated, free T4 normal -Pacer pads at bedside -Atropine prn -No current indication for emergent venous temp wire or pacemaker at this time -If he becomes hemodynamically unstable, could use dopamine gtt  2. HTN: -Blood pressure reasonable -Degree of permissive hypertension given bradycardia  -Avoid AV nodal blocking medications -PTA amlodipine   3. AKI: -Uncertain etiology, cannot exclude ATN with bradycardia, though has not been hypotensive  -Not on nephro-toxic medications -Has not received contrast -IM planning to consult nephrology    For questions or updates, please contact [facility] Please consult [email or web address] for contact info under Cardiology/STEMI.    Signed, Eula Listen, PA-C [facility] Pager: [phone] 11/14/2020, 8:16 AM

## 2020-11-14 NOTE — Progress Notes (Signed)
Monitor ordered by hospital provider Dr. Mariah Milling to be mailed to patient.

## 2020-11-14 NOTE — Telephone Encounter (Signed)
-----   Message from Margrett Rud, [place] sent at 11/11/2020 11:17 AM EDT ----- Patient no showed Echo. Can we please try to scheduled that.   Patient has an upcoming appt 11/07 to follow up from the ECHO so that might need to be moved out as well.   Thanks you!

## 2020-11-14 NOTE — Progress Notes (Signed)
Called and spoke with patient. Reviewed that monitor was ordered and mailed to his address. Advised it has instructions and if any questions to just give [place] a call back. He verbalized understanding with no further questions at this time.

## 2020-11-14 NOTE — TOC Initial Note (Signed)
Transition of Care [facility]) - Initial/Assessment Note    Patient Details  Name: [name] MRN: [id number] Date of Birth: [birth year]  Transition of Care [facility]) CM/SW Contact:    Hetty Ely, RN Phone Number: 11/14/2020, 11:20 AM  Clinical Narrative:  TOCRN spoke with patient who is alert and oriented x4, says he is retired and live alone. Drives and able to attend medical appointments and get medications from the [facility] pharmacy on [address] Independent with ADL's, cooking and shopping. No DME nor HHS used or needed. TOC to continue to track.                 Expected Discharge Plan: Home/Self Care Barriers to Discharge: Continued Medical Work up   Patient Goals and CMS Choice Patient states their goals for this hospitalization and ongoing recovery are:: To return home      Expected Discharge Plan and Services Expected Discharge Plan: Home/Self Care       Living arrangements for the past 2 months: Single Family Home                                      Prior Living Arrangements/Services Living arrangements for the past 2 months: Single Family Home Lives with:: Self Patient language and need for interpreter reviewed:: Yes Do you feel safe going back to the place where you live?: Yes      Need for Family Participation in Patient Care: No (Comment) Care giver support system in place?: No (comment)   Criminal Activity/Legal Involvement Pertinent to Current Situation/Hospitalization: No - Comment as needed  Activities of Daily Living Home Assistive Devices/Equipment: None ADL Screening (condition at time of admission) Patient's cognitive ability adequate to safely complete daily activities?: Yes Is the patient deaf or have difficulty hearing?: No Does the patient have difficulty seeing, even when wearing glasses/contacts?: No Does the patient have difficulty concentrating, remembering, or making decisions?: No Patient able to express need for assistance  with ADLs?: Yes Does the patient have difficulty dressing or bathing?: No Independently performs ADLs?: Yes (appropriate for developmental age) Does the patient have difficulty walking or climbing stairs?: No Weakness of Legs: None Weakness of Arms/Hands: None  Permission Sought/Granted                  Emotional Assessment Appearance:: Appears stated age Attitude/Demeanor/Rapport: Engaged Affect (typically observed): Stable Orientation: : Oriented to Self, Oriented to Place, Oriented to  Time, Oriented to Situation Alcohol / Substance Use: Not Applicable Psych Involvement: No (comment)  Admission diagnosis:  Shortness of breath [R06.02] Complete heart block (HCC) [I44.2] SOB (shortness of breath) [R06.02] Complete heart block by electrocardiogram [facility]) [I44.2] Patient Active Problem List   Diagnosis Date Noted   Type 2 diabetes mellitus without complication, without long-term current use of insulin (HCC)    SOB (shortness of breath) 11/11/2020   HTN (hypertension), benign 11/11/2020   Complete heart block (HCC) 11/11/2020   PCP:  [facility] Pharmacy:   [facility] [address] ([address]), [place] - [address] [place]) [place] [address] Phone: [phone] Fax: [phone]     Social Determinants of Health (SDOH) Interventions    Readmission Risk Interventions No flowsheet data found.

## 2020-11-14 NOTE — Telephone Encounter (Signed)
Patient currently admitted to icu

## 2020-11-15 DIAGNOSIS — N179 Acute kidney failure, unspecified: Secondary | ICD-10-CM

## 2020-11-15 DIAGNOSIS — I442 Atrioventricular block, complete: Secondary | ICD-10-CM | POA: Diagnosis not present

## 2020-11-15 DIAGNOSIS — I10 Essential (primary) hypertension: Secondary | ICD-10-CM | POA: Diagnosis not present

## 2020-11-15 DIAGNOSIS — N189 Chronic kidney disease, unspecified: Secondary | ICD-10-CM

## 2020-11-15 DIAGNOSIS — R001 Bradycardia, unspecified: Secondary | ICD-10-CM | POA: Diagnosis not present

## 2020-11-15 LAB — BASIC METABOLIC PANEL
Anion gap: 6 (ref 5–15)
BUN: 38 mg/dL — ABNORMAL HIGH (ref 8–23)
CO2: 26 mmol/L (ref 22–32)
Calcium: 8.5 mg/dL — ABNORMAL LOW (ref 8.9–10.3)
Chloride: 105 mmol/L (ref 98–111)
Creatinine, Ser: 1.85 mg/dL — ABNORMAL HIGH (ref 0.61–1.24)
GFR, Estimated: 40 mL/min — ABNORMAL LOW (ref >60)
Glucose, Bld: 204 mg/dL — ABNORMAL HIGH (ref 70–99)
Potassium: 4.3 mmol/L (ref 3.5–5.1)
Sodium: 137 mmol/L (ref 135–145)

## 2020-11-15 LAB — GLUCOSE, CAPILLARY
Glucose-Capillary: 181 mg/dL — ABNORMAL HIGH (ref 70–99)
Glucose-Capillary: 220 mg/dL — ABNORMAL HIGH (ref 70–99)

## 2020-11-15 MED ORDER — ASPIRIN 81 MG PO TBEC: 81.0000 mg | DELAYED_RELEASE_TABLET | Freq: Every day | ORAL | 11 refills | Status: DC

## 2020-11-15 MED ORDER — LISINOPRIL 20 MG PO TABS: 20.0000 mg | ORAL_TABLET | Freq: Every day | ORAL | 0 refills | Status: DC

## 2020-11-15 NOTE — Discharge Instructions (Signed)
Keep a log of your sugar and blood pressure at home. There is some changes made in your medications at discharge.

## 2020-11-15 NOTE — Discharge Summary (Signed)
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 Dermatophytosis of foot   . Diabetes mellitus without complication (HCC)   . Hypertension   . Hypokalemia   . Sebaceous cyst   . Vitamin D deficiency     HOSPITAL COURSE:   [name] is a 65 y.o. male with past medical history of diabetes, hypertension, sebaceous cyst, vitamin D deficient presents to the ER for evaluation of several days of worsening generalized weakness and fatigue as well as shortness of breath particular with any exertion.   Sinus bradycardia/?complete heart block -- patient is asymptomatic. Heart rate 35 to 42. -- Seen by Dr. Lalla Brothers EP-- recommends Coreg washout and 48 hour monitoring of heart rhythm. If no improvement patient will be [facility] for permanent pacing likely on Friday. --11/4-- heart rate 42-47 resting. Walks with walker and heart rate went up to the 60s. Dr. Mariah Milling does not feel this is complete heart block. Per cardiology-- recommends Zio patch which will be mailed to patient's house. Monitor another 24 hours. No indication for pacemaker --11/5-- HR now in the 70's felt to be 2-1 AV block (dr Mariah Milling). Ok to go home per Dr Azucena Cecil. Pt aware about the Zio-patch   Hypertension -- blood pressure is stable -- continue amlodipine, lisinopril (20 mg) --d/c lasix and KCL for now (due to rise in creat)--f/u PCP    type II diabetes with CKD-IIIa hyperlipidemia -- seen by diabetes coordinator recommends insulin while in house -- A1c is 8.6 -- continue Lipitor --resumed glimipride, Ozempic. D/ced metformin (creat up)--f/u PCP with sugars and adjust meds accordingly   [name] -- continue PPI   Cough --cxr no PNA some bronchial crowding. Will try cough meds. No fever   Family communication : brother in the ER 11/2 Consults : [facility] cardiology CODE STATUS: full DVT Prophylaxis : heparin Level of care: Stepdown Status is: Inpatient   D/c home     CONSULTS OBTAINED:  Treatment Team:  Debbe Odea, MD  DRUG ALLERGIES:  No Known Allergies  DISCHARGE MEDICATIONS:   Allergies as of 11/15/2020   No Known Allergies      Medication List     STOP taking these medications    carvedilol 25 MG tablet Commonly known as: COREG   exenatide 10 MCG/0.04ML Sopn injection Commonly known as: BYETTA   furosemide 20 MG tablet Commonly known as: LASIX   linagliptin 5 MG Tabs tablet Commonly known as: TRADJENTA   metFORMIN 500 MG tablet Commonly known as: GLUCOPHAGE   potassium chloride 20 MEQ packet Commonly known as: KLOR-CON   spironolactone 25 MG tablet Commonly known as: ALDACTONE       TAKE these medications    amLODipine 10 MG tablet Commonly known as: NORVASC Take 10 mg by mouth daily.   aspirin 81 MG EC tablet Take 1 tablet (81 mg total) by mouth daily. Swallow whole. Start taking on: November 16, 2020 What changed:  how much to take when to take this additional instructions   atorvastatin 40 MG tablet Commonly known as: LIPITOR Take 40 mg by mouth daily.   glipiZIDE 10 MG tablet Commonly known as: GLUCOTROL TAKE 1 TABLET BY MOUTH TWICE DAILY FOR BLOOD SUGAR   lisinopril 20 MG tablet Commonly known as: ZESTRIL Take 1 tablet (20 mg total) by mouth daily. What changed:  medication strength how much to take   Ozempic (0.25 or 0.5 MG/DOSE) 2 MG/1.5ML  Sopn Generic drug: Semaglutide(0.25 or 0.5MG /DOS) Inject 0.5 mg into the skin once a week. Sunday   pantoprazole 40 MG tablet Commonly known as: PROTONIX Take 1 tablet (40 mg total) by mouth daily.        If you experience worsening of your admission symptoms, develop shortness of breath, life threatening emergency, suicidal or homicidal thoughts you must seek medical attention immediately by calling 911 or calling your MD immediately  if symptoms less severe.  You Must read complete instructions/literature along with all the possible adverse reactions/side effects for all the Medicines you take and that have been prescribed to you. Take any new Medicines after you have completely understood and accept all the possible adverse reactions/side effects.   Please note  You were cared for by a hospitalist during your hospital stay. If you have any questions about your discharge medications or the care you received while you were in the hospital after you are discharged, you can call the unit and asked to speak with the hospitalist on call if the hospitalist that took care of you is not available. Once you are discharged, your primary care physician will handle any further medical issues. Please note that NO REFILLS for any discharge medications will be authorized once you are discharged, as it is imperative that you return to your primary care physician (or establish a relationship with a primary care physician if you do not have one) for your aftercare needs so that they can reassess your need for medications and monitor your lab values. Today   SUBJECTIVE   HR 70-80 no cp or sob  VITAL SIGNS:  Blood pressure (!) 153/94, pulse 86, temperature 98.4 F (36.9 C), resp. rate 17, height 5\' 9"  (1.753 m), weight 99.8 kg, SpO2 96 %.  I/O:   Intake/Output Summary (Last 24 hours) at 11/15/2020 1211 Last data filed at 11/15/2020 1001 Gross per 24 hour  Intake --  Output 1725 ml  Net -1725 ml     PHYSICAL EXAMINATION:  GENERAL:  65 y.o.-year-old patient lying in the bed with no acute distress.  EYES: Pupils equal, round, reactive to light and accommodation. No scleral icterus.  HEENT: Head atraumatic, normocephalic. Oropharynx and nasopharynx clear.  NECK:  Supple, no jugular venous distention. No thyroid enlargement, no tenderness.  LUNGS: Normal breath sounds bilaterally, no wheezing, rales,rhonchi or crepitation. No use of accessory muscles of respiration.  CARDIOVASCULAR: S1, S2 normal. No murmurs, rubs, or gallops.  ABDOMEN: Soft, non-tender, non-distended. Bowel sounds present. No organomegaly or mass.  EXTREMITIES: No pedal edema, cyanosis, or clubbing.  NEUROLOGIC: Cranial nerves II through XII are intact. Muscle strength 5/5 in all extremities. Sensation intact. Gait not checked.  PSYCHIATRIC: The patient is alert and oriented x 3.  SKIN: No obvious rash, lesion, or ulcer.   DATA REVIEW:   CBC  Recent Labs  Lab 11/14/20 0455  WBC 10.3  HGB 11.4*  HCT 32.7*  PLT 222    Chemistries  Recent Labs  Lab 11/14/20 0455  11/15/20 0800  NA 139 137  K 4.2 4.3  CL 107 105  CO2 24 26  GLUCOSE 167* 204*  BUN 36* 38*  CREATININE 2.33* 1.85*  CALCIUM 8.4* 8.5*  MG 2.0  --     Microbiology Results   Recent Results (from the past 240 hour(s))  Resp Panel by RT-PCR (Flu A&B, Covid) Nasopharyngeal Swab     Status: None   Collection Time: 11/12/20  9:35 PM   Specimen: Nasopharyngeal Swab; Nasopharyngeal(NP) swabs in vial transport medium  Result Value Ref Range Status   SARS Coronavirus 2 by RT PCR NEGATIVE NEGATIVE Final    Comment: (NOTE) SARS-CoV-2 target nucleic acids are NOT DETECTED.  The SARS-CoV-2 RNA is generally detectable in upper respiratory specimens during the acute phase of infection. The lowest concentration of SARS-CoV-2 viral copies this assay can detect is 138 copies/mL. A negative result does not preclude SARS-Cov-2 infection and should not  be used as the sole basis for treatment or other patient management decisions. A negative result may occur with  improper specimen collection/handling, submission of specimen other than nasopharyngeal swab, presence of viral mutation(s) within the areas targeted by this assay, and inadequate number of viral copies(<138 copies/mL). A negative result must be combined with clinical observations, patient history, and epidemiological information. The expected result is Negative.  Fact Sheet for Patients:  BloggerCourse.com  Fact Sheet for Healthcare Providers:  SeriousBroker.it  This test is no t yet approved or cleared by the [place] FDA and  has been authorized for detection and/or diagnosis of SARS-CoV-2 by FDA under an Emergency Use Authorization (EUA). This EUA will remain  in effect (meaning this test can be used) for the duration of the COVID-19 declaration under Section 564(b)(1) of the Act, 21 U.S.C.section 360bbb-3(b)(1), unless the authorization is terminated  or revoked sooner.       Influenza A by PCR NEGATIVE NEGATIVE Final   Influenza B by PCR NEGATIVE NEGATIVE Final    Comment: (NOTE) The Xpert Xpress SARS-CoV-2/FLU/RSV plus assay is intended as an aid in the diagnosis of influenza from Nasopharyngeal swab specimens and should not be used as a sole basis for treatment. Nasal washings and aspirates are unacceptable for Xpert Xpress SARS-CoV-2/FLU/RSV testing.  Fact Sheet for Patients: BloggerCourse.com  Fact Sheet for Healthcare Providers: SeriousBroker.it  This test is not yet approved or cleared by the [place] FDA and has been authorized for detection and/or diagnosis of SARS-CoV-2 by FDA under an Emergency Use Authorization (EUA). This EUA will remain in effect (meaning this test can be used) for the duration of the COVID-19 declaration under Section  564(b)(1) of the Act, 21 U.S.C. section 360bbb-3(b)(1), unless the authorization is terminated or revoked.  Performed at [facility], [address] [place], [place] [address]   MRSA Next Gen by PCR, Nasal     Status: None   Collection Time: 11/12/20 10:21 PM   Specimen: Nasal Mucosa; Nasal Swab  Result Value Ref Range Status   MRSA by PCR Next Gen NOT DETECTED NOT DETECTED Final    Comment: (NOTE) The GeneXpert MRSA Assay (FDA approved for NASAL specimens only), is one component of a comprehensive MRSA colonization surveillance program. It is not intended to diagnose MRSA infection nor to guide or monitor treatment for MRSA infections. Test performance is not FDA approved in patients less than 5 years old. Performed at [facility], [address], [place], [place] [address]     RADIOLOGY:  No results  found.   CODE STATUS:     Code Status Orders  (From admission, onward)           Start     Ordered   11/11/20 2344  Full code  Continuous        11/11/20 2344           Code Status History     This patient has a current code status but no historical code status.        TOTAL TIME TAKING CARE OF THIS PATIENT: 40 minutes.    Enedina Finner M.D  [facility]    CC: Primary care physician; Center, [facility]

## 2020-11-15 NOTE — Progress Notes (Signed)
Progress Note  Patient Name: [name] Date of Encounter: 11/15/2020  [facility] Cardiologist: Debbe Odea, MD   Subjective   Feels well, denies chest pain, shortness of breath, dizziness.  Heart rates better averaging 70s beats per minute  Inpatient Medications    Scheduled Meds:  amLODipine  10 mg Oral Daily   aspirin EC  81 mg Oral Daily   atorvastatin  40 mg Oral Daily   benzonatate  100 mg Oral TID   Chlorhexidine Gluconate Cloth  6 each Topical Daily   heparin  5,000 Units Subcutaneous Q8H   insulin aspart  0-15 Units Subcutaneous TID WC   insulin glargine-yfgn  16 Units Subcutaneous Daily   sodium chloride flush  3 mL Intravenous Q12H   Continuous Infusions:  sodium chloride     PRN Meds: sodium chloride, hydrALAZINE, ipratropium-albuterol, ondansetron (ZOFRAN) IV, sodium chloride flush   Vital Signs    Vitals:   11/15/20 0800 11/15/20 0900 11/15/20 1000 11/15/20 1100  BP: (!) 154/94 (!) 160/86 (!) 147/86 (!) 153/94  Pulse:  86    Resp: (!) 25 (!) 26  17  Temp:      TempSrc:      SpO2:  96%    Weight:      Height:        Intake/Output Summary (Last 24 hours) at 11/15/2020 1450 Last data filed at 11/15/2020 1239 Gross per 24 hour  Intake --  Output 1550 ml  Net -1550 ml   Last 3 Weights 11/11/2020 10/06/2020 03/25/2015  Weight (lbs) 220 lb 225 lb 216 lb  Weight (kg) 99.791 kg 102.059 kg 97.977 kg      Telemetry    Sinus rhythm, heart rate 74- Personally Reviewed  ECG     - Personally Reviewed  Physical Exam   GEN: No acute distress.   Neck: No JVD Cardiac: RRR, no murmurs, rubs, or gallops.  Respiratory: Clear to auscultation bilaterally. GI: Soft, nontender, non-distended  MS: No edema; No deformity. Neuro:  Nonfocal  Psych: Normal affect   Labs    High Sensitivity Troponin:   Recent Labs  Lab 11/11/20 2036 11/11/20 2235  TROPONINIHS 9 13     Chemistry Recent Labs  Lab 11/11/20 2036 11/13/20 0505  11/14/20 0455 11/15/20 0800  NA 138 140 139 137  K 4.7 4.1 4.2 4.3  CL 105 108 107 105  CO2 25 21* 24 26  GLUCOSE 230* 272* 167* 204*  BUN 24* 30* 36* 38*  CREATININE 1.68* 1.79* 2.33* 1.85*  CALCIUM 9.0 8.7* 8.4* 8.5*  MG 1.7  --  2.0  --   GFRNONAA 45* 42* 30* 40*  ANIONGAP 8 11 8 6     Lipids No results for input(s): CHOL, TRIG, HDL, LABVLDL, LDLCALC, CHOLHDL in the last 168 hours.  Hematology Recent Labs  Lab 11/11/20 2036 11/14/20 0455  WBC 13.5* 10.3  RBC 4.29 3.85*  HGB 12.7* 11.4*  HCT 36.3* 32.7*  MCV 84.6 84.9  MCH 29.6 29.6  MCHC 35.0 34.9  RDW 14.0 14.3  PLT 296 222   Thyroid  Recent Labs  Lab 11/11/20 2036 11/13/20 0505  TSH 6.895*  --   FREET4  --  1.10    BNPNo results for input(s): BNP, PROBNP in the last 168 hours.  DDimer No results for input(s): DDIMER in the last 168 hours.   Radiology    No results found.  Cardiac Studies   Echo 11/12/2020 1. Left ventricular ejection fraction, by  estimation, is 65 to 70%. The  left ventricle has normal function. The left ventricle has no regional  wall motion abnormalities. There is mild left ventricular hypertrophy.  Left ventricular diastolic parameters  were normal.   2. Right ventricular systolic function is normal. The right ventricular  size is normal.   3. Left atrial size was mildly dilated.   4. The mitral valve is normal in structure. No evidence of mitral valve  regurgitation.   5. The aortic valve is tricuspid. Aortic valve regurgitation is not  visualized.   Patient Profile     65 y.o. male with history of hypertension, hyperlipidemia presenting with weakness, diagnosed with complete heart block in the setting of beta-blocker use.  Assessment & Plan    Complete heart block -In the setting of Coreg use -Heart rates improved with stopping beta-blocker/Coreg. -Telemetry today showing sinus rhythm, heart rate 74 -Echo with normal EF -Continue to hold AV nodal agents, cardiac  monitor mailed to patient -Follow-up with myself as outpatient ,follow-up with EP as outpatient  2. Hypertension -Restart amlodipine -If BP stays elevated, plan to restart lisinopril. -Plan to check creatinine in 3 days at follow-up visit with myself.  3.  Hyperlipidemia -PTA Lipitor.  Total encounter time 35 minutes  Greater than 50% was spent in counseling and coordination of care with the patient     Signed, Debbe Odea, MD  11/15/2020, 2:50 PM

## 2020-11-15 NOTE — Progress Notes (Addendum)
D/C instructions given and IV's removed. Escorted out via wheelchair with NT accompanying, to waiting ride.

## 2020-11-17 ENCOUNTER — Ambulatory Visit: Payer: Medicare (Managed Care) | Admitting: Cardiology

## 2020-11-17 ENCOUNTER — Encounter: Payer: Self-pay | Admitting: Cardiology

## 2020-11-17 ENCOUNTER — Other Ambulatory Visit: Payer: Self-pay

## 2020-11-17 VITALS — BP 152/80 | HR 78 | Ht 69.0 in | Wt 215.0 lb

## 2020-11-17 DIAGNOSIS — I442 Atrioventricular block, complete: Secondary | ICD-10-CM

## 2020-11-17 DIAGNOSIS — I10 Essential (primary) hypertension: Secondary | ICD-10-CM

## 2020-11-17 DIAGNOSIS — E7800 Pure hypercholesterolemia, unspecified: Secondary | ICD-10-CM | POA: Diagnosis not present

## 2020-11-17 MED ORDER — LISINOPRIL 20 MG PO TABS: 20.0000 mg | ORAL_TABLET | Freq: Two times a day (BID) | ORAL | 5 refills | Status: DC

## 2020-11-17 NOTE — Progress Notes (Signed)
Cardiology Office Note:    Date:  11/17/2020   ID:  [name], DOB [birth year], MRN [id number]  PCP:  Center, [facility] Providers Cardiologist:  Debbe Odea, MD     Referring MD: Center, [facility]*   Chief Complaint  Patient presents with   Other    4-6 week follow up -- ED visit recently. Patient denies any symptoms at time. Meds reviewed verbally with patient.     History of Present Illness:    [name] is a 65 y.o. male with a hx of hypertension, hyperlipidemia, diabetes, CHB (in the setting of beta-blocker/Coreg, resolved with stopping) who presents for follow-up.  Patient recently seen in the hospital due to weakness, diagnosed with complete heart block.  He was on carvedilol 25 mg twice daily.  Carvedilol was stopped with improvement in heart rates and resolution of AV nodal block.  He states feeling well, cardiac monitor was mailed to his home from the hospital, has not received this yet.  Denies dizziness, syncope.  Prior notes Echocardiogram 11/2020 normal systolic and diastolic function, mild LVH, EF 65 to 70%  Past Medical History:  Diagnosis Date   Dermatophytosis of foot    Diabetes mellitus without complication (HCC)    Hypertension    Hypokalemia    Sebaceous cyst    Vitamin D deficiency     Past Surgical History:  Procedure Laterality Date   APPENDECTOMY      Current Medications: Current Meds  Medication Sig   amLODipine (NORVASC) 10 MG tablet Take 10 mg by mouth daily.   aspirin EC 81 MG EC tablet Take 1 tablet (81 mg total) by mouth daily. Swallow whole.   atorvastatin (LIPITOR) 40 MG tablet Take 40 mg by mouth daily.   glipiZIDE (GLUCOTROL) 10 MG tablet TAKE 1 TABLET BY MOUTH TWICE DAILY FOR BLOOD SUGAR   OZEMPIC, 0.25 OR 0.5 MG/DOSE, 2 MG/1.5ML SOPN Inject 0.5 mg into the skin once a week. Sunday   pantoprazole (PROTONIX) 40 MG tablet Take 1 tablet (40 mg total) by mouth daily.    [DISCONTINUED] lisinopril (ZESTRIL) 20 MG tablet Take 1 tablet (20 mg total) by mouth daily.     Allergies:   Patient has no known allergies.   Social History   Socioeconomic History   Marital status: Single    Spouse name: Not on file   Number of children: Not on file   Years of education: Not on file   Highest education level: Not on file  Occupational History   Not on file  Tobacco Use   Smoking status: Former   Smokeless tobacco: Never  Substance and Sexual Activity   Alcohol use: Yes    Comment: occ.   Drug use: Never   Sexual activity: Not on file  Other Topics Concern   Not on file  Social History Narrative   Not on file   Social Determinants of Health   Financial Resource Strain: Not on file  Food Insecurity: Not on file  Transportation Needs: Not on file  Physical Activity: Not on file  Stress: Not on file  Social Connections: Not on file     Family History: The patient's family history includes Heart disease in his mother; Stroke in his father.  ROS:   Please see the history of present illness.     All other systems reviewed and are negative.  EKGs/Labs/Other Studies Reviewed:    The following studies were reviewed today:  EKG:  EKG is  ordered today.  The ekg ordered today demonstrates normal sinus rhythm, normal ECG, heart rate 78  Recent Labs: 11/11/2020: TSH 6.895 11/14/2020: Hemoglobin 11.4; Magnesium 2.0; Platelets 222 11/15/2020: BUN 38; Creatinine, Ser 1.85; Potassium 4.3; Sodium 137  Recent Lipid Panel No results found for: CHOL, TRIG, HDL, CHOLHDL, VLDL, LDLCALC, LDLDIRECT   Risk Assessment/Calculations:         Physical Exam:    VS:  BP (!) 152/80 (BP Location: Left Arm, Patient Position: Sitting, Cuff Size: Normal)   Pulse 78   Ht 5\' 9"  (1.753 m)   Wt 215 lb (97.5 kg)   SpO2 96%   BMI 31.75 kg/m     Wt Readings from Last 3 Encounters:  11/17/20 215 lb (97.5 kg)  11/11/20 220 lb (99.8 kg)  10/06/20 225 lb (102.1 kg)      GEN:  Well nourished, well developed in no acute distress HEENT: Normal NECK: No JVD; No carotid bruits LYMPHATICS: No lymphadenopathy CARDIAC: RRR, no murmurs, rubs, gallops RESPIRATORY:  Clear to auscultation without rales, wheezing or rhonchi  ABDOMEN: Soft, non-tender, non-distended MUSCULOSKELETAL:  No edema; No deformity  SKIN: Warm and dry NEUROLOGIC:  Alert and oriented x 3 PSYCHIATRIC:  Normal affect   ASSESSMENT:    1. CHB (complete heart block) (HCC)   2. Primary hypertension   3. Pure hypercholesterolemia     PLAN:    In order of problems listed above:  Complete heart block in the setting of taking Coreg.  Resolved with stopping Coreg.  Echo with normal systolic and diastolic function, EF 65%.  Cardiac monitor previously mailed to patient's home.  If no further AV nodal dysfunction, he will not need a pacemaker. Hypertension, BP elevated.  Increase lisinopril to 20 mg twice daily, continue Norvasc 10 mg daily. Hyperlipidemia, continue Lipitor.  Follow-up in 3 months.     Medication Adjustments/Labs and Tests Ordered: Current medicines are reviewed at length with the patient today.  Concerns regarding medicines are outlined above.  Orders Placed This Encounter  Procedures   EKG 12-Lead    Meds ordered this encounter  Medications   lisinopril (ZESTRIL) 20 MG tablet    Sig: Take 1 tablet (20 mg total) by mouth in the morning and at bedtime.    Dispense:  60 tablet    Refill:  5     Patient Instructions  Medication Instructions:   Your physician has recommended you make the following change in your medication:    INCREASE your Lisinopril to 20 MG twice a day.  *If you need a refill on your cardiac medications before your next appointment, please call your pharmacy*   Lab Work: None ordered If you have labs (blood work) drawn today and your tests are completely normal, you will receive your results only by: MyChart Message (if you have MyChart)  OR A paper copy in the mail If you have any lab test that is abnormal or we need to change your treatment, we will call you to review the results.   Testing/Procedures: None ordered   Follow-Up: At [facility], you and your health needs are our priority.  As part of our continuing mission to provide you with exceptional heart care, we have created designated Provider Care Teams.  These Care Teams include your primary Cardiologist (physician) and Advanced Practice Providers (APPs -  Physician Assistants and Nurse Practitioners) who all work together to provide you with the care you need, when you need it.  We recommend signing up for the patient portal called "MyChart".  Sign up information is provided on this After Visit Summary.  MyChart is used to connect with patients for Virtual Visits (Telemedicine).  Patients are able to view lab/test results, encounter notes, upcoming appointments, etc.  Non-urgent messages can be sent to your provider as well.   To learn more about what you can do with MyChart, go to [email or web address].    Your next appointment:   6 week(s)  The format for your next appointment:   In Person  Provider:   You may see Debbe Odea, MD or one of the following Advanced Practice Providers on your designated Care Team:   Nicolasa Ducking, NP Eula Listen, PA-C Cadence Fransico Michael, [place]    Other Instructions    Signed, Debbe Odea, MD  11/17/2020 12:29 PM    [facility]

## 2020-11-17 NOTE — Patient Instructions (Signed)
Medication Instructions:   Your physician has recommended you make the following change in your medication:    INCREASE your Lisinopril to 20 MG twice a day.  *If you need a refill on your cardiac medications before your next appointment, please call your pharmacy*   Lab Work: None ordered If you have labs (blood work) drawn today and your tests are completely normal, you will receive your results only by: MyChart Message (if you have MyChart) OR A paper copy in the mail If you have any lab test that is abnormal or we need to change your treatment, we will call you to review the results.   Testing/Procedures: None ordered   Follow-Up: At [facility], you and your health needs are our priority.  As part of our continuing mission to provide you with exceptional heart care, we have created designated Provider Care Teams.  These Care Teams include your primary Cardiologist (physician) and Advanced Practice Providers (APPs -  Physician Assistants and Nurse Practitioners) who all work together to provide you with the care you need, when you need it.  We recommend signing up for the patient portal called "MyChart".  Sign up information is provided on this After Visit Summary.  MyChart is used to connect with patients for Virtual Visits (Telemedicine).  Patients are able to view lab/test results, encounter notes, upcoming appointments, etc.  Non-urgent messages can be sent to your provider as well.   To learn more about what you can do with MyChart, go to [email or web address].    Your next appointment:   6 week(s)  The format for your next appointment:   In Person  Provider:   You may see Debbe Odea, MD or one of the following Advanced Practice Providers on your designated Care Team:   Nicolasa Ducking, NP Eula Listen, PA-C Cadence Fransico Michael, [place]    Other Instructions

## 2020-11-27 ENCOUNTER — Other Ambulatory Visit: Payer: Self-pay

## 2020-11-27 ENCOUNTER — Emergency Department: Payer: Medicare (Managed Care)

## 2020-11-27 ENCOUNTER — Emergency Department
Admission: EM | Admit: 2020-11-27 | Discharge: 2020-11-27 | Disposition: A | Discharge status: Home / Self Care | Payer: Medicare (Managed Care) | Attending: Emergency Medicine | Admitting: Emergency Medicine

## 2020-11-27 ENCOUNTER — Encounter: Payer: Self-pay | Admitting: Emergency Medicine

## 2020-11-27 DIAGNOSIS — Z7982 Long term (current) use of aspirin: Secondary | ICD-10-CM | POA: Insufficient documentation

## 2020-11-27 DIAGNOSIS — Z20822 Contact with and (suspected) exposure to covid-19: Secondary | ICD-10-CM | POA: Insufficient documentation

## 2020-11-27 DIAGNOSIS — E119 Type 2 diabetes mellitus without complications: Secondary | ICD-10-CM | POA: Diagnosis not present

## 2020-11-27 DIAGNOSIS — Z79899 Other long term (current) drug therapy: Secondary | ICD-10-CM | POA: Diagnosis not present

## 2020-11-27 DIAGNOSIS — Z87891 Personal history of nicotine dependence: Secondary | ICD-10-CM | POA: Diagnosis not present

## 2020-11-27 DIAGNOSIS — I10 Essential (primary) hypertension: Secondary | ICD-10-CM | POA: Diagnosis not present

## 2020-11-27 DIAGNOSIS — R0602 Shortness of breath: Secondary | ICD-10-CM | POA: Diagnosis present

## 2020-11-27 DIAGNOSIS — I4430 Unspecified atrioventricular block: Secondary | ICD-10-CM | POA: Diagnosis not present

## 2020-11-27 LAB — URINALYSIS, ROUTINE W REFLEX MICROSCOPIC
Bilirubin Urine: NEGATIVE
Glucose, UA: NEGATIVE mg/dL
Hgb urine dipstick: NEGATIVE
Ketones, ur: NEGATIVE mg/dL
Leukocytes,Ua: NEGATIVE
Nitrite: NEGATIVE
Protein, ur: NEGATIVE mg/dL
Specific Gravity, Urine: 1.01 (ref 1.005–1.030)
pH: 5 (ref 5.0–8.0)

## 2020-11-27 LAB — BASIC METABOLIC PANEL
Anion gap: 9 (ref 5–15)
BUN: 21 mg/dL (ref 8–23)
CO2: 25 mmol/L (ref 22–32)
Calcium: 9.2 mg/dL (ref 8.9–10.3)
Chloride: 104 mmol/L (ref 98–111)
Creatinine, Ser: 1.58 mg/dL — ABNORMAL HIGH (ref 0.61–1.24)
GFR, Estimated: 48 mL/min — ABNORMAL LOW (ref >60)
Glucose, Bld: 192 mg/dL — ABNORMAL HIGH (ref 70–99)
Potassium: 3.9 mmol/L (ref 3.5–5.1)
Sodium: 138 mmol/L (ref 135–145)

## 2020-11-27 LAB — CBC
HCT: 39.6 % (ref 39.0–52.0)
Hemoglobin: 13.1 g/dL (ref 13.0–17.0)
MCH: 28.1 pg (ref 26.0–34.0)
MCHC: 33.1 g/dL (ref 30.0–36.0)
MCV: 84.8 fL (ref 80.0–100.0)
Platelets: 354 10*3/uL (ref 150–400)
RBC: 4.67 MIL/uL (ref 4.22–5.81)
RDW: 13.6 % (ref 11.5–15.5)
WBC: 14.6 10*3/uL — ABNORMAL HIGH (ref 4.0–10.5)
nRBC: 0 % (ref 0.0–0.2)

## 2020-11-27 LAB — RESP PANEL BY RT-PCR (FLU A&B, COVID) ARPGX2
Influenza A by PCR: NEGATIVE
Influenza B by PCR: NEGATIVE
SARS Coronavirus 2 by RT PCR: NEGATIVE

## 2020-11-27 LAB — BRAIN NATRIURETIC PEPTIDE: B Natriuretic Peptide: 573.4 pg/mL — ABNORMAL HIGH (ref 0.0–100.0)

## 2020-11-27 LAB — TROPONIN I (HIGH SENSITIVITY)
Troponin I (High Sensitivity): 13 ng/L (ref <18)
Troponin I (High Sensitivity): 14 ng/L (ref <18)

## 2020-11-27 MED ORDER — SODIUM CHLORIDE 0.9 % IV BOLUS
500.0000 mL | Freq: Once | INTRAVENOUS | Status: AC
  Administered 2020-11-27: 13:00:00 500 mL via INTRAVENOUS

## 2020-11-27 NOTE — Discharge Instructions (Addendum)
You have a "2:1 AV block" which is a type of arrhythmia that usually does not require a pacemaker.  Follow-up with your cardiologist as scheduled.  They will call you about the results of the monitor you are wearing.  In the meantime, return to the ER immediately for new, worsening, or persistent severe weakness, shortness of breath, chest pain, dizziness or lightheadedness, feeling like you are going to pass out, or any other new or worsening symptoms that concern you.

## 2020-11-27 NOTE — ED Triage Notes (Signed)
Pt comes into the ED via [facility] from home c/o weakness and exertional [facility] that started yesterday.  Pt had recent episode that was similar 2 weeks ago where they took him off carvedilol and placed him on a Holter monitor.    217 CBG 211/90 40 HR that progressed to 50's.   20g L AC

## 2020-11-27 NOTE — ED Notes (Signed)
Pt verbalized understanding of discharge paperwork and follow-up care.  °

## 2020-11-27 NOTE — ED Provider Notes (Addendum)
[facility] Emergency Department Provider Note ____________________________________________   Event Date/Time   First MD Initiated Contact with Patient 11/27/20 1107     (approximate)  I have reviewed the triage vital signs and the nursing notes.   HISTORY  Chief Complaint Shortness of Breath and Bradycardia    HPI [name] is a 65 y.o. male with PMH as noted below including diabetes and hypertension as well as possible heart block who presents with generalized weakness, acute onset yesterday, associated with shortness of breath and palpitations, and persisting today.  It is worse with exertion and resolves when he is resting.  The patient states he slept well.  He denies any associated chest pain.  He has no fever or chills, vomiting, or other acute symptoms.  Past Medical History:  Diagnosis Date   Dermatophytosis of foot    Diabetes mellitus without complication (HCC)    Hypertension    Hypokalemia    Sebaceous cyst    Vitamin D deficiency     Patient Active Problem List   Diagnosis Date Noted   Acute kidney injury superimposed on CKD (HCC)    Type 2 diabetes mellitus without complication, without long-term current use of insulin (HCC)    SOB (shortness of breath) 11/11/2020   HTN (hypertension), benign 11/11/2020   Complete heart block (HCC) 11/11/2020    Past Surgical History:  Procedure Laterality Date   APPENDECTOMY      Prior to Admission medications   Medication Sig Start Date End Date Taking? Authorizing Provider  amLODipine (NORVASC) 10 MG tablet Take 10 mg by mouth daily.    [provider]  aspirin EC 81 MG EC tablet Take 1 tablet (81 mg total) by mouth daily. Swallow whole. 11/16/20   Enedina Finner, MD  atorvastatin (LIPITOR) 40 MG tablet Take 40 mg by mouth daily.    [provider]  glipiZIDE (GLUCOTROL) 10 MG tablet TAKE 1 TABLET BY MOUTH TWICE DAILY FOR BLOOD SUGAR 05/09/19   [provider]   lisinopril (ZESTRIL) 20 MG tablet Take 1 tablet (20 mg total) by mouth in the morning and at bedtime. 11/17/20   Debbe Odea, MD  OZEMPIC, 0.25 OR 0.5 MG/DOSE, 2 MG/1.5ML SOPN Inject 0.5 mg into the skin once a week. Sunday 10/07/20   [provider]  pantoprazole (PROTONIX) 40 MG tablet Take 1 tablet (40 mg total) by mouth daily. 10/06/20   Debbe Odea, MD    Allergies Patient has no known allergies.  Family History  Problem Relation Age of Onset   Heart disease Mother    Stroke Father     Social History Social History   Tobacco Use   Smoking status: Former   Smokeless tobacco: Never  Substance Use Topics   Alcohol use: Yes    Comment: occ.   Drug use: Never    Review of Systems  Constitutional: No fever/chills Eyes: No visual changes. ENT: No sore throat. Cardiovascular: Denies chest pain.  Positive for palpitations. Respiratory: Positive for shortness of breath. Gastrointestinal: No vomiting or diarrhea.  Genitourinary: Negative for dysuria.  Musculoskeletal: Negative for back pain. Skin: Negative for rash. Neurological: Negative for headaches, focal weakness or numbness.   ____________________________________________   PHYSICAL EXAM:  VITAL SIGNS: ED Triage Vitals  Enc Vitals Group     BP 11/27/20 1111 (!) 187/83     Pulse Rate 11/27/20 1111 (!) 44     Resp 11/27/20 1111 (!) 23     Temp  11/27/20 1111 97.8 F (36.6 C)     Temp Source 11/27/20 1111 Oral     SpO2 11/27/20 1111 96 %     Weight 11/27/20 1109 215 lb (97.5 kg)     Height 11/27/20 1109 5\' 9"  (1.753 m)     Head Circumference --      Peak Flow --      Pain Score 11/27/20 1108 0     Pain Loc --      Pain Edu? --      Excl. in GC? --     Constitutional: Alert and oriented. Well appearing and in no acute distress. Eyes: Conjunctivae are normal.  Head: Atraumatic. Nose: No congestion/rhinnorhea. Mouth/Throat: Mucous membranes are moist.   Neck: Normal range of motion.   Cardiovascular: Bradycardic, regular rhythm. Grossly normal heart sounds.  Good peripheral circulation. Respiratory: Normal respiratory effort.  No retractions. Lungs CTAB. Gastrointestinal: No distention.  Musculoskeletal: No lower extremity edema.  Extremities warm and well perfused.  Neurologic:  Normal speech and language. No gross focal neurologic deficits are appreciated.  Skin:  Skin is warm and dry. No rash noted. Psychiatric: Mood and affect are normal. Speech and behavior are normal.  ____________________________________________   LABS (all labs ordered are listed, but only abnormal results are displayed)  Labs Reviewed  BASIC METABOLIC PANEL - Abnormal; Notable for the following components:      Result Value   Glucose, Bld 192 (*)    Creatinine, Ser 1.58 (*)    GFR, Estimated 48 (*)    All other components within normal limits  CBC - Abnormal; Notable for the following components:   WBC 14.6 (*)    All other components within normal limits  BRAIN NATRIURETIC PEPTIDE - Abnormal; Notable for the following components:   B Natriuretic Peptide 573.4 (*)    All other components within normal limits  URINALYSIS, ROUTINE W REFLEX MICROSCOPIC - Abnormal; Notable for the following components:   Color, Urine YELLOW (*)    APPearance CLEAR (*)    All other components within normal limits  RESP PANEL BY RT-PCR (FLU A&B, COVID) ARPGX2  TROPONIN I (HIGH SENSITIVITY)  TROPONIN I (HIGH SENSITIVITY)   ____________________________________________  EKG  ED ECG REPORT I, 11/29/20, the attending physician, personally viewed and interpreted this ECG.  Date: 11/27/2020 EKG Time: 1107 Rate: 52 Rhythm: Undetermined rhythm, possible 2:1 AV block QRS Axis: normal Intervals: normal ST/T Wave abnormalities: Nonspecific ST abnormalities Narrative Interpretation: Undetermined rhythm, likely 2:1 AV block, with no evidence of acute  ischemia  ____________________________________________  RADIOLOGY  Chest x-ray interpreted by me shows no focal consolidation or edema  ____________________________________________   PROCEDURES  Procedure(s) performed: No  Procedures  Critical Care performed: No ____________________________________________   INITIAL IMPRESSION / ASSESSMENT AND PLAN / ED COURSE  Pertinent labs & imaging results that were available during my care of the patient were reviewed by me and considered in my medical decision making (see chart for details).   65 year old male with PMH as noted above including diabetes and hypertension presents with generalized weakness and shortness of breath since yesterday.  I reviewed the past medical records in Epic; the patient was just admitted earlier this month with similar symptoms, EKG at that time showing what appeared to be complete heart block.  He was discontinued on Coreg and his symptoms resolved.  He was sent a Zio patch which he has been wearing.  Patient is hypertensive and slightly bradycardic; exam is otherwise unremarkable.  EKG today shows an undetermined rhythm, likely 2:1 AV block; the patient has additional P waves within T waves in a regular pattern.  This appears different from his EKG from 11/1.  It is unclear if the patient is in a recurrent or persistent arrhythmia or if his symptoms are related to new onset CHF, ACS, or noncardiac etiology such as pneumonia, COVID-19, influenza, or electrolyte abnormality/metabolic cause.  There is no evidence for PE given the lack of tachycardia, hypoxia, or chest pain.    Will obtain chest x-ray, lab work-up, continue to monitor the patient, and consult cardiology.  ----------------------------------------- 2:50 PM on 11/27/2020 -----------------------------------------  I consulted Dr. Mariah Milling from cardiology who had evaluated the patient during his last admission.  He advises that the patient appeared  to be in a 2:1 AV block rather than complete heart block.  He advised that this usually does not require pacing, and the patient is currently being remotely monitored via the Zio patch.  He reviewed the patient's EKG and labs today and advises that the patient still appears to be in a 2:1 AV block.  He recommended that we do a trial of ambulation and monitor the patient's heart rate and pulse oximetry.  He also suggested that the patient may be slightly dehydrated.  On ambulation to the bathroom, the patient's heart rate went up to close to 100 and then immediately went down to the 40s when he sat down.  O2 saturation remained in the 90s.  The patient had minimal shortness of breath during this but overall tolerated it well.  The work-up is reassuring.  The chest x-ray shows no evidence of acute CHF or other abnormality.  Respiratory panel is negative.  Labs are unremarkable except for elevated WBC count which was seen during the patient's last admission.  Troponins are negative x2 and BNP is minimally elevated.  I discussed the case again with Dr. Mariah Milling after the trial of ambulation.  He advised that the patient's increase in heart rate was an appropriate response and actually reassuring that he is not in a heart block that would require pacing.  He advised that the patient was appropriate for discharge and has cardiology follow-up already scheduled.  On reassessment, the patient states he feels well to go home and agrees with the cardiology plan.  He has not had any further shortness of breath or other significant symptoms.  I counseled him on the results of the work-up and the cardiology recommendations.  I gave him extensive return precautions and he expressed understanding.  ____________________________________________   FINAL CLINICAL IMPRESSION(S) / ED DIAGNOSES  Final diagnoses:  Shortness of breath  AV block      NEW MEDICATIONS STARTED DURING THIS VISIT:  New Prescriptions   No  medications on file     Note:  This document was prepared using Dragon voice recognition software and may include unintentional dictation errors.        Dionne Bucy, MD 11/27/20 [phone]

## 2020-12-10 ENCOUNTER — Telehealth: Payer: Self-pay | Admitting: Cardiology

## 2020-12-10 NOTE — Telephone Encounter (Signed)
  1. Is this related to a heart monitor you are wearing?  (If the patient says no, please ask     if they are caling about ICD/pacemaker.) YES  2. What is your issue?? Critical report zio (If the patient is calling for results of the heart monitor this     message should be sent to nurse.)   Please route to covering RN/CMA/RMA for results. Route to monitor technicians or your monitor tech representative for your site for any technical concerns

## 2020-12-10 NOTE — Telephone Encounter (Signed)
Received a stat call from [employer]. Patient had 7000 episodes of HB, including 2nd degree Mobitz 2 and complete HB, slowest rate 31 bpm, srtip 9 demonstrates complete HB with a heart rate of 33 bpm for 24 secs.  Monitor report has been uploaded. Will route to Dr. Mariah Milling and his nurse as high priority.

## 2020-12-23 NOTE — Progress Notes (Signed)
Electrophysiology Office Follow up Visit Note:    Date:  12/24/2020   ID:  [name], DOB 24-Sep-1955, MRN [id number]  PCP:  [facility] Cardiologist:  Debbe Odea, MD  [facility] Electrophysiologist:  Lanier Prude, MD    Interval History:    [name] is a 65 y.o. male who presents for a follow up visit.  I last saw the patient November 12, 2020 for complete heart block when he was in the emergency department.  We held his carvedilol and monitored him.  He was ultimately discharged November 5 after heart rates improved.  He wore a heart monitor after discharge which resulted November 30.  Heart monitor showed second and third-degree AV block.  Since discharge she is actually come back to the emergency department for shortness of breath.  During that ER visit was found to be back in a 2-1 AV block.  With exertion his heart rate was noted to increase.  He tells me that while he wore the heart monitor he had a lot of episodes of shortness of breath and fatigue while trying to exert himself.  The symptoms are not present at rest.     Past Medical History:  Diagnosis Date   Dermatophytosis of foot    Diabetes mellitus without complication (HCC)    Hypertension    Hypokalemia    Sebaceous cyst    Vitamin D deficiency     Past Surgical History:  Procedure Laterality Date   APPENDECTOMY      Current Medications: Current Meds  Medication Sig   amLODipine (NORVASC) 10 MG tablet Take 10 mg by mouth daily.   aspirin EC 81 MG EC tablet Take 1 tablet (81 mg total) by mouth daily. Swallow whole.   atorvastatin (LIPITOR) 40 MG tablet Take 40 mg by mouth daily.   glipiZIDE (GLUCOTROL) 10 MG tablet TAKE 1 TABLET BY MOUTH TWICE DAILY FOR BLOOD SUGAR   lisinopril (ZESTRIL) 20 MG tablet Take 1 tablet (20 mg total) by mouth in the morning and at bedtime.   metFORMIN (GLUCOPHAGE) 1000 MG tablet Take 1,000 mg by mouth 2 (two) times  daily.   OZEMPIC, 0.25 OR 0.5 MG/DOSE, 2 MG/1.5ML SOPN Inject 0.5 mg into the skin once a week. Sunday   pantoprazole (PROTONIX) 40 MG tablet Take 1 tablet (40 mg total) by mouth daily.     Allergies:   Patient has no known allergies.   Social History   Socioeconomic History   Marital status: Single    Spouse name: Not on file   Number of children: Not on file   Years of education: Not on file   Highest education level: Not on file  Occupational History   Not on file  Tobacco Use   Smoking status: Former   Smokeless tobacco: Never  Substance and Sexual Activity   Alcohol use: Yes    Comment: occ.   Drug use: Never   Sexual activity: Not on file  Other Topics Concern   Not on file  Social History Narrative   Not on file   Social Determinants of Health   Financial Resource Strain: Not on file  Food Insecurity: Not on file  Transportation Needs: Not on file  Physical Activity: Not on file  Stress: Not on file  Social Connections: Not on file     Family History: The patient's family history includes Heart disease in his mother; Stroke in his father.  ROS:  Please see the history of present illness.    All other systems reviewed and are negative.  EKGs/Labs/Other Studies Reviewed:    The following studies were reviewed today:  December 10, 2020 ZIO monitor personally reviewed Heart rate 31 to 207 bpm, average 66 2 episodes of nonsustained VT, longest lasting 15 beats Fairly good heart rate variability Second third-degree AV block were noted  EKG:  The ekg ordered today demonstrates sinus arrhythmia.  LVH.  Recent Labs: 11/11/2020: TSH 6.895 11/14/2020: Magnesium 2.0 11/27/2020: B Natriuretic Peptide 573.4; BUN 21; Creatinine, Ser 1.58; Hemoglobin 13.1; Platelets 354; Potassium 3.9; Sodium 138  Recent Lipid Panel No results found for: CHOL, TRIG, HDL, CHOLHDL, VLDL, LDLCALC, LDLDIRECT  Physical Exam:    VS:  BP 134/72    Pulse 79    Ht 5\' 9"  (1.753 m)     Wt 223 lb (101.2 kg)    SpO2 98%    BMI 32.93 kg/m     Wt Readings from Last 3 Encounters:  12/24/20 223 lb (101.2 kg)  11/27/20 215 lb (97.5 kg)  11/17/20 215 lb (97.5 kg)     GEN:  Well nourished, well developed in no acute distress HEENT: Normal NECK: No JVD; No carotid bruits LYMPHATICS: No lymphadenopathy CARDIAC: RRR, no murmurs, rubs, gallops RESPIRATORY:  Clear to auscultation without rales, wheezing or rhonchi  ABDOMEN: Soft, non-tender, non-distended MUSCULOSKELETAL:  No edema; No deformity  SKIN: Warm and dry NEUROLOGIC:  Alert and oriented x 3 PSYCHIATRIC:  Normal affect        ASSESSMENT:    1. Complete heart block (HCC)   2. HTN (hypertension), benign   3. Type 2 diabetes mellitus without complication, without long-term current use of insulin (HCC)    PLAN:    In order of problems listed above:  #Paroxysmal complete heart block, advanced AV block Symptomatic with exertional dyspnea and fatigue.  Also with decreased exercise tolerance.  We discussed how pacemaker will likely help the symptoms but I cannot guarantee that the pacemaker alleviate all of his symptoms.  We discussed the pacemaker procedure in detail including the risks and recovery and he wishes to proceed.  We are checking with his insurance company to see what the out-of-pocket cost will be for him.  We discussed this also during today's appointment.  If the cost is prohibitive, we will get him seen by another local electrophysiologist.  Plan for left bundle area dual-chamber pacemaker.  Plan for Biotronik.  Risks, benefits, alternatives to PPM implantation were discussed in detail with the patient today. The patient understands that the risks include but are not limited to bleeding, infection, pneumothorax, perforation, tamponade, vascular damage, renal failure, MI, stroke, death, and lead dislodgement and wishes to proceed.  We will therefore schedule device implantation at the next available  time.  #Hypertension Controlled Continue current regimen  #Diabetes Continue current regimen    Medication Adjustments/Labs and Tests Ordered: Current medicines are reviewed at length with the patient today.  Concerns regarding medicines are outlined above.  No orders of the defined types were placed in this encounter.  No orders of the defined types were placed in this encounter.    Signed, 13/07/22, MD, [facility], [facility] [place] [facility] 12/24/2020 9:37 AM    Electrophysiology [facility]

## 2020-12-24 ENCOUNTER — Ambulatory Visit (INDEPENDENT_AMBULATORY_CARE_PROVIDER_SITE_OTHER): Payer: Medicare (Managed Care) | Admitting: Cardiology

## 2020-12-24 ENCOUNTER — Encounter: Payer: Self-pay | Admitting: Cardiology

## 2020-12-24 ENCOUNTER — Other Ambulatory Visit: Payer: Self-pay

## 2020-12-24 VITALS — BP 134/72 | HR 79 | Ht 69.0 in | Wt 223.0 lb

## 2020-12-24 DIAGNOSIS — E119 Type 2 diabetes mellitus without complications: Secondary | ICD-10-CM | POA: Diagnosis not present

## 2020-12-24 DIAGNOSIS — I442 Atrioventricular block, complete: Secondary | ICD-10-CM

## 2020-12-24 DIAGNOSIS — I10 Essential (primary) hypertension: Secondary | ICD-10-CM

## 2020-12-24 NOTE — Patient Instructions (Addendum)
Medication Instructions:  Your physician recommends that you continue on your current medications as directed. Please refer to the Current Medication list given to you today. *If you need a refill on your cardiac medications before your next appointment, please call your pharmacy*  Lab Work: None ordered. If you have labs (blood work) drawn today and your tests are completely normal, you will receive your results only by: MyChart Message (if you have MyChart) OR A paper copy in the mail If you have any lab test that is abnormal or we need to change your treatment, we will call you to review the results.  Testing/Procedures: None ordered.  Follow-Up: At [facility], you and your health needs are our priority.  As part of our continuing mission to provide you with exceptional heart care, we have created designated Provider Care Teams.  These Care Teams include your primary Cardiologist (physician) and Advanced Practice Providers (APPs -  Physician Assistants and Nurse Practitioners) who all work together to provide you with the care you need, when you need it.   We will work on getting you an estimate for pacemaker implant.  Pacemaker Implantation, Adult Pacemaker implantation is a procedure to place a pacemaker inside the chest. A pacemaker is a small computer that sends electrical signals to the heart and helps the heart beat normally. A pacemaker also stores information about heart rhythms. You may need pacemaker implantation if you have: A slow heartbeat (bradycardia). Loss of consciousness that happens repeatedly (syncope) or repeated episodes of dizziness or light-headedness because of an irregular heart rate. Shortness of breath (dyspnea) due to heart problems. The pacemaker usually attaches to your heart through a wire called a lead. One or two leads may be needed. There are different types of pacemakers: Transvenous pacemaker. This type is placed under the skin or muscle of your  upper chest area. The lead goes through a vein in the chest area to reach the inside of the heart. Epicardial pacemaker. This type is placed under the skin or muscle of your chest or abdomen. The lead goes through your chest to the outside of the heart. Tell a health care provider about: Any allergies you have. All medicines you are taking, including vitamins, herbs, eye drops, creams, and over-the-counter medicines. Any problems you or family members have had with anesthetic medicines. Any blood or bone disorders you have. Any surgeries you have had. Any medical conditions you have. Whether you are pregnant or may be pregnant. What are the risks? Generally, this is a safe procedure. However, problems may occur, including: Infection. Bleeding. Failure of the pacemaker or the lead. Collapse of a lung or bleeding into a lung. Blood clot inside a blood vessel with a lead. Damage to the heart. Infection inside the heart (endocarditis). Allergic reactions to medicines. What happens before the procedure? Staying hydrated Follow instructions from your health care provider about hydration, which may include: Up to 2 hours before the procedure - you may continue to drink clear liquids, such as water, clear fruit juice, black coffee, and plain tea.  Eating and drinking restrictions Follow instructions from your health care provider about eating and drinking, which may include: 8 hours before the procedure - stop eating heavy meals or foods, such as meat, fried foods, or fatty foods. 6 hours before the procedure - stop eating light meals or foods, such as toast or cereal. 6 hours before the procedure - stop drinking milk or drinks that contain milk. 2 hours before the procedure -  stop drinking clear liquids. Medicines Ask your health care provider about: Changing or stopping your regular medicines. This is especially important if you are taking diabetes medicines or blood thinners. Taking  medicines such as aspirin and ibuprofen. These medicines can thin your blood. Do not take these medicines unless your health care provider tells you to take them. Taking over-the-counter medicines, vitamins, herbs, and supplements. Tests You may have: A heart evaluation. This may include: An electrocardiogram (ECG). This involves placing patches on your skin to check your heart rhythm. A chest X-ray. An echocardiogram. This is a test that uses sound waves (ultrasound) to produce an image of the heart. A cardiac rhythm monitor. This is used to record your heart rhythm and any events for a longer period of time. Blood tests. Genetic testing. General instructions Do not use any products that contain nicotine or tobacco for at least 4 weeks before the procedure. These products include cigarettes, e-cigarettes, and chewing tobacco. If you need help quitting, ask your health care provider. Ask your health care provider: How your surgery site will be marked. What steps will be taken to help prevent infection. These steps may include: Removing hair at the surgery site. Washing skin with a germ-killing soap. Receiving antibiotic medicine. Plan to have someone take you home from the hospital or clinic. If you will be going home right after the procedure, plan to have someone with you for 24 hours. What happens during the procedure? An IV will be inserted into one of your veins. You will be given one or more of the following: A medicine to help you relax (sedative). A medicine to numb the area (local anesthetic). A medicine to make you fall asleep (general anesthetic). The next steps vary depending on the type of pacemaker you will be getting. If you are getting a transvenous pacemaker: An incision will be made in your upper chest. A pocket will be made for the pacemaker. It may be placed under the skin or between layers of muscle. The lead will be inserted into a blood vessel that goes to the  heart. While X-rays are taken by an imaging machine (fluoroscopy), the lead will be advanced through the vein to the inside of your heart. The other end of the lead will be tunneled under the skin and attached to the pacemaker. If you are getting an epicardial pacemaker: An incision will be made near your ribs or breastbone (sternum) for the lead. The lead will be attached to the outside of your heart. Another incision will be made in your chest or upper abdomen to create a pocket for the pacemaker. The free end of the lead will be tunneled under the skin and attached to the pacemaker. The transvenous or epicardial pacemaker will be tested. Imaging studies may be done to check the lead position. The incisions will be closed with stitches (sutures), adhesive strips, or skin glue. Bandages (dressings) will be placed over the incisions. The procedure may vary among health care providers and hospitals. What happens after the procedure? Your blood pressure, heart rate, breathing rate, and blood oxygen level will be monitored until you leave the hospital or clinic. You may be given antibiotics. You will be given pain medicine. An ECG and chest X-rays will be done. You may need to wear a continuous type of ECG (Holter monitor) to check your heart rhythm. Your health care provider will program the pacemaker. If you were given a sedative during the procedure, it can affect you for  several hours. Do not drive or operate machinery until your health care provider says that it is safe. You will be given a pacemaker identification card. This card lists the implant date, device model, and manufacturer of your pacemaker. Summary A pacemaker is a small computer that sends electrical signals to the heart and helps the heart beat normally. There are different types of pacemakers. A pacemaker may be placed under the skin or muscle of your chest or abdomen. Follow instructions from your health care provider about  eating and drinking and about taking medicines before the procedure. This information is not intended to replace advice given to you by your health care provider. Make sure you discuss any questions you have with your health care provider. Document Revised: 09/09/2020 Document Reviewed: 11/29/2018 Elsevier Patient Education  2022 [employer].

## 2020-12-29 ENCOUNTER — Encounter: Payer: Self-pay | Admitting: Cardiology

## 2020-12-29 ENCOUNTER — Ambulatory Visit (INDEPENDENT_AMBULATORY_CARE_PROVIDER_SITE_OTHER): Payer: Medicare (Managed Care) | Admitting: Cardiology

## 2020-12-29 ENCOUNTER — Other Ambulatory Visit: Payer: Self-pay

## 2020-12-29 VITALS — BP 160/80 | HR 73 | Ht 69.0 in | Wt 227.0 lb

## 2020-12-29 DIAGNOSIS — E7800 Pure hypercholesterolemia, unspecified: Secondary | ICD-10-CM

## 2020-12-29 DIAGNOSIS — I442 Atrioventricular block, complete: Secondary | ICD-10-CM | POA: Diagnosis not present

## 2020-12-29 DIAGNOSIS — I10 Essential (primary) hypertension: Secondary | ICD-10-CM | POA: Diagnosis not present

## 2020-12-29 MED ORDER — LISINOPRIL 40 MG PO TABS: 40.0000 mg | ORAL_TABLET | Freq: Every day | ORAL | 5 refills | Status: DC

## 2020-12-29 MED ORDER — HYDRALAZINE HCL 25 MG PO TABS: 25.0000 mg | ORAL_TABLET | Freq: Two times a day (BID) | ORAL | 5 refills | Status: DC

## 2020-12-29 NOTE — Patient Instructions (Signed)
Medication Instructions:   Your physician has recommended you make the following change in your medication:    START taking Hydralazine 25 MG twice a day.  2.    Change your Lisinopril to 40 MG once a day.   *If you need a refill on your cardiac medications before your next appointment, please call your pharmacy*   Lab Work: None ordered If you have labs (blood work) drawn today and your tests are completely normal, you will receive your results only by: MyChart Message (if you have MyChart) OR A paper copy in the mail If you have any lab test that is abnormal or we need to change your treatment, we will call you to review the results.   Testing/Procedures: None ordered   Follow-Up: At [facility], you and your health needs are our priority.  As part of our continuing mission to provide you with exceptional heart care, we have created designated Provider Care Teams.  These Care Teams include your primary Cardiologist (physician) and Advanced Practice Providers (APPs -  Physician Assistants and Nurse Practitioners) who all work together to provide you with the care you need, when you need it.  We recommend signing up for the patient portal called "MyChart".  Sign up information is provided on this After Visit Summary.  MyChart is used to connect with patients for Virtual Visits (Telemedicine).  Patients are able to view lab/test results, encounter notes, upcoming appointments, etc.  Non-urgent messages can be sent to your provider as well.   To learn more about what you can do with MyChart, go to [email or web address].    Your next appointment:   3 month(s)  The format for your next appointment:   In Person  Provider:   You may see Debbe Odea, MD or one of the following Advanced Practice Providers on your designated Care Team:   Nicolasa Ducking, NP Eula Listen, PA-C Cadence Fransico Michael, [place]    Other Instructions

## 2020-12-29 NOTE — Progress Notes (Signed)
Cardiology Office Note:    Date:  12/29/2020   ID:  [name], DOB 27-Jan-1955, MRN [id number]  PCP:  Center, [facility] Providers Cardiologist:  Debbe Odea, MD Electrophysiologist:  Lanier Prude, MD     Referring MD: Center, [facility]*   Chief Complaint  Patient presents with   OTher    6 week follow up -- Patient c.o SOB and getting tired real fast. Meds reviewed verbally with patient.     History of Present Illness:    [name] is a 65 y.o. male with a hx of hypertension, hyperlipidemia, diabetes, CHB (in the setting of beta-blocker/Coreg, resolved with stopping) who presents for follow-up.  Previously seen due to hypertension, complete heart block in the setting of carvedilol use.  Cardiac monitor was placed to evaluate recurrence of AV nodal blockade.  Cardiac monitor did reveal frequent AV blocks, including second-degree/Mobitz 2 and third-degree AV block.  Followed up with electrophysiology, pacemaker is being planned.  States blood pressures are still elevated at home.   Prior notes Echocardiogram 11/2020 normal systolic and diastolic function, mild LVH, EF 65 to 70% Cardiac monitor 11/2020 episodes of Mobitz 2 and third-degree AV block present  Past Medical History:  Diagnosis Date   Dermatophytosis of foot    Diabetes mellitus without complication (HCC)    Hypertension    Hypokalemia    Sebaceous cyst    Vitamin D deficiency     Past Surgical History:  Procedure Laterality Date   APPENDECTOMY      Current Medications: Current Meds  Medication Sig   amLODipine (NORVASC) 10 MG tablet Take 10 mg by mouth daily.   aspirin EC 81 MG EC tablet Take 1 tablet (81 mg total) by mouth daily. Swallow whole.   atorvastatin (LIPITOR) 40 MG tablet Take 40 mg by mouth daily.   glipiZIDE (GLUCOTROL) 10 MG tablet TAKE 1 TABLET BY MOUTH TWICE DAILY FOR BLOOD SUGAR   hydrALAZINE (APRESOLINE) 25 MG tablet Take 1  tablet (25 mg total) by mouth 2 (two) times daily.   metFORMIN (GLUCOPHAGE) 1000 MG tablet Take 1,000 mg by mouth 2 (two) times daily.   OZEMPIC, 0.25 OR 0.5 MG/DOSE, 2 MG/1.5ML SOPN Inject 0.5 mg into the skin once a week. Sunday   pantoprazole (PROTONIX) 40 MG tablet Take 1 tablet (40 mg total) by mouth daily.   [DISCONTINUED] lisinopril (ZESTRIL) 20 MG tablet Take 1 tablet (20 mg total) by mouth in the morning and at bedtime.     Allergies:   Patient has no known allergies.   Social History   Socioeconomic History   Marital status: Single    Spouse name: Not on file   Number of children: Not on file   Years of education: Not on file   Highest education level: Not on file  Occupational History   Not on file  Tobacco Use   Smoking status: Former   Smokeless tobacco: Never  Substance and Sexual Activity   Alcohol use: Yes    Comment: occ.   Drug use: Never   Sexual activity: Not on file  Other Topics Concern   Not on file  Social History Narrative   Not on file   Social Determinants of Health   Financial Resource Strain: Not on file  Food Insecurity: Not on file  Transportation Needs: Not on file  Physical Activity: Not on file  Stress: Not on file  Social Connections: Not on file  Family History: The patient's family history includes Heart disease in his mother; Stroke in his father.  ROS:   Please see the history of present illness.     All other systems reviewed and are negative.  EKGs/Labs/Other Studies Reviewed:    The following studies were reviewed today:   EKG:  EKG is  ordered today.  The ekg ordered today demonstrates normal sinus rhythm, heart rate 73  Recent Labs: 11/11/2020: TSH 6.895 11/14/2020: Magnesium 2.0 11/27/2020: B Natriuretic Peptide 573.4; BUN 21; Creatinine, Ser 1.58; Hemoglobin 13.1; Platelets 354; Potassium 3.9; Sodium 138  Recent Lipid Panel No results found for: CHOL, TRIG, HDL, CHOLHDL, VLDL, LDLCALC, LDLDIRECT   Risk  Assessment/Calculations:         Physical Exam:    VS:  BP (!) 160/80 (BP Location: Left Arm, Patient Position: Sitting, Cuff Size: Normal)    Pulse 73    Ht 5\' 9"  (1.753 m)    Wt 227 lb (103 kg)    SpO2 96%    BMI 33.52 kg/m     Wt Readings from Last 3 Encounters:  12/29/20 227 lb (103 kg)  12/24/20 223 lb (101.2 kg)  11/27/20 215 lb (97.5 kg)     GEN:  Well nourished, well developed in no acute distress HEENT: Normal NECK: No JVD; No carotid bruits LYMPHATICS: No lymphadenopathy CARDIAC: RRR, no murmurs, rubs, gallops RESPIRATORY:  Clear to auscultation without rales, wheezing or rhonchi  ABDOMEN: Soft, non-tender, non-distended MUSCULOSKELETAL:  No edema; No deformity  SKIN: Warm and dry NEUROLOGIC:  Alert and oriented x 3 PSYCHIATRIC:  Normal affect   ASSESSMENT:    1. Complete heart block (HCC)   2. Primary hypertension   3. Pure hypercholesterolemia    PLAN:    In order of problems listed above:  Complete heart block in the setting of taking carvedilol.  Recent cardiac monitor 12/10/2020 showing frequent episodes of high degree AV block including Mobitz 2 and third-degree.  Previously evaluated by EP, pacemaker is being planned.  Continue to avoid AV nodal agents.  Echo 11/2020 EF 65%. Hypertension, BP elevated.  Start hydralazine 25 mg twice daily, continue lisinopril 40 mg daily, amlodipine 10 mg daily.  He takes Lasix, unsure of dose.  Plans to call the office regarding dose. Hyperlipidemia, continue Lipitor.  Follow-up in 3 months.     Medication Adjustments/Labs and Tests Ordered: Current medicines are reviewed at length with the patient today.  Concerns regarding medicines are outlined above.  Orders Placed This Encounter  Procedures   EKG 12-Lead    Meds ordered this encounter  Medications   lisinopril (ZESTRIL) 40 MG tablet    Sig: Take 1 tablet (40 mg total) by mouth daily.    Dispense:  30 tablet    Refill:  5   hydrALAZINE (APRESOLINE) 25 MG  tablet    Sig: Take 1 tablet (25 mg total) by mouth 2 (two) times daily.    Dispense:  60 tablet    Refill:  5     Patient Instructions  Medication Instructions:   Your physician has recommended you make the following change in your medication:    START taking Hydralazine 25 MG twice a day.  2.    Change your Lisinopril to 40 MG once a day.   *If you need a refill on your cardiac medications before your next appointment, please call your pharmacy*   Lab Work: None ordered If you have labs (blood work) drawn today and your tests  are completely normal, you will receive your results only by: MyChart Message (if you have MyChart) OR A paper copy in the mail If you have any lab test that is abnormal or we need to change your treatment, we will call you to review the results.   Testing/Procedures: None ordered   Follow-Up: At [facility], you and your health needs are our priority.  As part of our continuing mission to provide you with exceptional heart care, we have created designated Provider Care Teams.  These Care Teams include your primary Cardiologist (physician) and Advanced Practice Providers (APPs -  Physician Assistants and Nurse Practitioners) who all work together to provide you with the care you need, when you need it.  We recommend signing up for the patient portal called "MyChart".  Sign up information is provided on this After Visit Summary.  MyChart is used to connect with patients for Virtual Visits (Telemedicine).  Patients are able to view lab/test results, encounter notes, upcoming appointments, etc.  Non-urgent messages can be sent to your provider as well.   To learn more about what you can do with MyChart, go to [email or web address].    Your next appointment:   3 month(s)  The format for your next appointment:   In Person  Provider:   You may see Debbe Odea, MD or one of the following Advanced Practice Providers on your designated Care Team:    Nicolasa Ducking, NP Eula Listen, PA-C Cadence Fransico Michael, [place]    Other Instructions     Signed, Debbe Odea, MD  12/29/2020 4:28 PM    [facility]

## 2020-12-30 ENCOUNTER — Other Ambulatory Visit (INDEPENDENT_AMBULATORY_CARE_PROVIDER_SITE_OTHER): Payer: Self-pay | Admitting: Nurse Practitioner

## 2020-12-30 DIAGNOSIS — I739 Peripheral vascular disease, unspecified: Secondary | ICD-10-CM

## 2020-12-31 ENCOUNTER — Ambulatory Visit (INDEPENDENT_AMBULATORY_CARE_PROVIDER_SITE_OTHER): Payer: Medicare (Managed Care) | Admitting: Nurse Practitioner

## 2020-12-31 ENCOUNTER — Ambulatory Visit (INDEPENDENT_AMBULATORY_CARE_PROVIDER_SITE_OTHER): Payer: Medicare (Managed Care)

## 2020-12-31 ENCOUNTER — Other Ambulatory Visit: Payer: Self-pay

## 2020-12-31 VITALS — BP 181/92 | HR 49 | Ht 69.0 in | Wt 227.0 lb

## 2020-12-31 DIAGNOSIS — I739 Peripheral vascular disease, unspecified: Secondary | ICD-10-CM | POA: Diagnosis not present

## 2020-12-31 DIAGNOSIS — Z7984 Long term (current) use of oral hypoglycemic drugs: Secondary | ICD-10-CM

## 2020-12-31 DIAGNOSIS — I10 Essential (primary) hypertension: Secondary | ICD-10-CM

## 2020-12-31 DIAGNOSIS — E119 Type 2 diabetes mellitus without complications: Secondary | ICD-10-CM | POA: Diagnosis not present

## 2020-12-31 DIAGNOSIS — E1169 Type 2 diabetes mellitus with other specified complication: Secondary | ICD-10-CM

## 2021-01-12 ENCOUNTER — Encounter (INDEPENDENT_AMBULATORY_CARE_PROVIDER_SITE_OTHER): Payer: Self-pay | Admitting: Nurse Practitioner

## 2021-01-12 NOTE — Progress Notes (Signed)
Subjective:    Patient ID: [name], male    DOB: [birth year], 66 y.o.   MRN: [id number] Chief Complaint  Patient presents with   New Patient (Initial Visit)    BIL Le coldness  ABI    [name] is a 66 year old male that presents today as a referral due to concern for cold feet.  Shortly after this time the patient was diagnosed with a complete heart block.  On 11/11/2020 the went to the emergency room as For approximately a week.  The changed his medicines around and since that time his feet have been warm.  There is discussion of an upcoming pacemaker to be placed.  He denies any claudication-like symptoms.  He denies any rest pain like symptoms or wounds or ulcerations.  Today his right ABI is 1.14 and a left of 1.12.  He has a TBI of 1.03 on the right and 1.02 on the left.  He has triphasic tibial artery waveforms bilaterally with good toe waveforms bilaterally.   Review of Systems  Cardiovascular:  Negative for leg swelling.  Musculoskeletal:  Negative for gait problem.  Skin:  Negative for color change.  All other systems reviewed and are negative.     Objective:   Physical Exam Vitals reviewed.  HENT:     Head: Normocephalic.  Cardiovascular:     Rate and Rhythm: Normal rate.     Pulses: Normal pulses.  Pulmonary:     Effort: Pulmonary effort is normal.  Skin:    General: Skin is warm and dry.  Neurological:     Mental Status: He is alert and oriented to person, place, and time.  Psychiatric:        Mood and Affect: Mood normal.        Behavior: Behavior normal.        Thought Content: Thought content normal.        Judgment: Judgment normal.    BP (!) 181/92    Pulse (!) 49    Ht 5\' 9"  (1.753 m)    Wt 227 lb (103 kg)    BMI 33.52 kg/m   Past Medical History:  Diagnosis Date   Dermatophytosis of foot    Diabetes mellitus without complication (HCC)    Hypertension    Hypokalemia    Sebaceous cyst    Vitamin D deficiency     Social History    Socioeconomic History   Marital status: Single    Spouse name: Not on file   Number of children: Not on file   Years of education: Not on file   Highest education level: Not on file  Occupational History   Not on file  Tobacco Use   Smoking status: Former   Smokeless tobacco: Never  Substance and Sexual Activity   Alcohol use: Yes    Comment: occ.   Drug use: Never   Sexual activity: Not on file  Other Topics Concern   Not on file  Social History Narrative   Not on file   Social Determinants of Health   Financial Resource Strain: Not on file  Food Insecurity: Not on file  Transportation Needs: Not on file  Physical Activity: Not on file  Stress: Not on file  Social Connections: Not on file  Intimate Partner Violence: Not on file    Past Surgical History:  Procedure Laterality Date   APPENDECTOMY      Family History  Problem Relation Age of Onset   Heart disease  Mother    Stroke Father     No Known Allergies  CBC Latest Ref Rng & Units 11/27/2020 11/14/2020 11/11/2020  WBC 4.0 - 10.5 K/uL 14.6(H) 10.3 13.5(H)  Hemoglobin 13.0 - 17.0 g/dL 13.1 11.4(L) 12.7(L)  Hematocrit 39.0 - 52.0 % 39.6 32.7(L) 36.3(L)  Platelets 150 - 400 K/uL 354 222 296      CMP     Component Value Date/Time   NA 138 11/27/2020 1108   K 3.9 11/27/2020 1108   CL 104 11/27/2020 1108   CO2 25 11/27/2020 1108   GLUCOSE 192 (H) 11/27/2020 1108   BUN 21 11/27/2020 1108   CREATININE 1.58 (H) 11/27/2020 1108   CALCIUM 9.2 11/27/2020 1108   GFRNONAA 48 (L) 11/27/2020 1108   GFRAA >60 03/26/2015 0041     VAS [place] ABI WITH/WO TBI  Result Date: 01/07/2021  LOWER EXTREMITY DOPPLER STUDY Patient Name:  [name]  Date of Exam:   12/31/2020 Medical Rec #: [id number]         Accession #:    [id number] Date of Birth: [birth year]          Patient Gender: M Patient Age:   56 years Exam Location:  [facility] Procedure:      VAS [place] ABI WITH/WO TBI Referring Phys:  --------------------------------------------------------------------------------  Indications: Cold feet.  Performing Technologist: Concha Norway RVT  Examination Guidelines: A complete evaluation includes at minimum, Doppler waveform signals and systolic blood pressure reading at the level of bilateral brachial, anterior tibial, and posterior tibial arteries, when vessel segments are accessible. Bilateral testing is considered an integral part of a complete examination. Photoelectric Plethysmograph (PPG) waveforms and toe systolic pressure readings are included as required and additional duplex testing as needed. Limited examinations for reoccurring indications may be performed as noted.  ABI Findings: +---------+------------------+-----+---------+--------+  Right     Rt Pressure (mmHg) Index Waveform  Comment   +---------+------------------+-----+---------+--------+  Brachial  161                                          +---------+------------------+-----+---------+--------+  ATA       171                      triphasic 1.06      +---------+------------------+-----+---------+--------+  PTA       184                1.14  triphasic           +---------+------------------+-----+---------+--------+  Great Toe 166                1.03  Normal              +---------+------------------+-----+---------+--------+ +---------+------------------+-----+---------+-------+  Left      Lt Pressure (mmHg) Index Waveform  Comment  +---------+------------------+-----+---------+-------+  ATA       181                      triphasic 1.12     +---------+------------------+-----+---------+-------+  PTA       166                1.03  triphasic          +---------+------------------+-----+---------+-------+  Great Toe 164  1.02  Normal             +---------+------------------+-----+---------+-------+  Summary: Right: Resting right ankle-brachial index is within normal range. No evidence of significant right lower extremity  arterial disease. The right toe-brachial index is normal. Left: Resting left ankle-brachial index is within normal range. No evidence of significant left lower extremity arterial disease. The left toe-brachial index is normal.  *See table(s) above for measurements and observations.  Electronically signed by Leotis Pain MD on 01/07/2021 at 8:47:40 AM.    Final        Assessment & Plan:   1. PVD (peripheral vascular disease) ([place]) I suspect that the cold sensation that he feels in his feet was related to his decreased heart rate.  Since he had his pacemaker his feet have been nice and warm.  Also today noninvasive studies do not indicate any evidence of peripheral arterial disease.  Patient will follow-up with [place] on an as-needed basis. - VAS [place] ABI WITH/WO TBI  2. Type 2 diabetes mellitus without complication, without long-term current use of insulin (HCC) Continue hypoglycemic medications as already ordered, these medications have been reviewed and there are no changes at this time.  Hgb A1C to be monitored as already arranged by primary service   3. HTN (hypertension), benign Continue antihypertensive medications as already ordered, these medications have been reviewed and there are no changes at this time.    Current Outpatient Medications on File Prior to Visit  Medication Sig Dispense Refill   amLODipine (NORVASC) 10 MG tablet Take 10 mg by mouth daily.     aspirin EC 81 MG EC tablet Take 1 tablet (81 mg total) by mouth daily. Swallow whole. 30 tablet 11   atorvastatin (LIPITOR) 40 MG tablet Take 40 mg by mouth daily.     furosemide (LASIX) 20 MG tablet Take 20 mg by mouth daily.     glipiZIDE (GLUCOTROL) 10 MG tablet TAKE 1 TABLET BY MOUTH TWICE DAILY FOR BLOOD SUGAR     hydrALAZINE (APRESOLINE) 25 MG tablet Take 1 tablet (25 mg total) by mouth 2 (two) times daily. 60 tablet 5   lisinopril (ZESTRIL) 40 MG tablet Take 1 tablet (40 mg total) by mouth daily. 30 tablet 5   metFORMIN  (GLUCOPHAGE) 1000 MG tablet Take 1,000 mg by mouth 2 (two) times daily.     OZEMPIC, 0.25 OR 0.5 MG/DOSE, 2 MG/1.5ML SOPN Inject 0.5 mg into the skin once a week. Sunday     pantoprazole (PROTONIX) 40 MG tablet Take 1 tablet (40 mg total) by mouth daily. 30 tablet 5   No current facility-administered medications on file prior to visit.    There are no Patient Instructions on file for this visit. No follow-ups on file.   Kris Hartmann, NP

## 2021-01-28 ENCOUNTER — Telehealth: Payer: Self-pay | Admitting: Cardiology

## 2021-01-28 DIAGNOSIS — I442 Atrioventricular block, complete: Secondary | ICD-10-CM

## 2021-01-28 DIAGNOSIS — Z01818 Encounter for other preprocedural examination: Secondary | ICD-10-CM

## 2021-01-28 NOTE — Telephone Encounter (Signed)
Plan for left bundle area dual-chamber pacemaker.  Plan for Biotronik. Per OV note with Dr. Lalla Brothers..  Patient picked date for procedure. Set labs for January 20 at the [facility] office. Will meet with RN for scrub and instructions.

## 2021-01-28 NOTE — Telephone Encounter (Signed)
Patient called, and states he is ready to schedule his device implant. Please call

## 2021-01-30 ENCOUNTER — Other Ambulatory Visit: Payer: Medicare (Managed Care) | Admitting: *Deleted

## 2021-01-30 ENCOUNTER — Other Ambulatory Visit: Payer: Self-pay | Admitting: *Deleted

## 2021-01-30 ENCOUNTER — Other Ambulatory Visit: Payer: Self-pay

## 2021-01-30 ENCOUNTER — Encounter: Payer: Self-pay | Admitting: *Deleted

## 2021-01-30 DIAGNOSIS — I442 Atrioventricular block, complete: Secondary | ICD-10-CM

## 2021-01-30 DIAGNOSIS — Z01818 Encounter for other preprocedural examination: Secondary | ICD-10-CM

## 2021-01-30 LAB — BASIC METABOLIC PANEL
BUN/Creatinine Ratio: 16 (ref 10–24)
BUN: 20 mg/dL (ref 8–27)
CO2: 28 mmol/L (ref 20–29)
Calcium: 9.4 mg/dL (ref 8.6–10.2)
Chloride: 102 mmol/L (ref 96–106)
Creatinine, Ser: 1.27 mg/dL (ref 0.76–1.27)
Glucose: 215 mg/dL — ABNORMAL HIGH (ref 70–99)
Potassium: 4.2 mmol/L (ref 3.5–5.2)
Sodium: 140 mmol/L (ref 134–144)
eGFR: 62 mL/min/{1.73_m2} (ref >59)

## 2021-01-30 LAB — CBC WITH DIFFERENTIAL/PLATELET
Basophils Absolute: 0 10*3/uL (ref 0.0–0.2)
Basos: 0 %
EOS (ABSOLUTE): 0.3 10*3/uL (ref 0.0–0.4)
Eos: 3 %
Hematocrit: 38.9 % (ref 37.5–51.0)
Hemoglobin: 13.2 g/dL (ref 13.0–17.7)
Lymphocytes Absolute: 3.7 10*3/uL — ABNORMAL HIGH (ref 0.7–3.1)
Lymphs: 34 %
MCH: 27.7 pg (ref 26.6–33.0)
MCHC: 33.9 g/dL (ref 31.5–35.7)
MCV: 82 fL (ref 79–97)
Monocytes Absolute: 0.8 10*3/uL (ref 0.1–0.9)
Monocytes: 7 %
Neutrophils Absolute: 5.9 10*3/uL (ref 1.4–7.0)
Neutrophils: 56 %
Platelets: 327 10*3/uL (ref 150–450)
RBC: 4.77 x10E6/uL (ref 4.14–5.80)
RDW: 16 % — ABNORMAL HIGH (ref 11.6–15.4)
WBC: 10.6 10*3/uL (ref 3.4–10.8)

## 2021-02-06 NOTE — Pre-Procedure Instructions (Signed)
Instructed patient on the following items: Arrival time 1330 Nothing to eat or drink after midnight No meds AM of procedure Responsible person to drive you home and stay with you for 24 hrs Wash with special soap night before and morning of procedure  

## 2021-02-09 ENCOUNTER — Encounter (HOSPITAL_COMMUNITY)
Admission: RE | Disposition: A | Discharge status: Home / Self Care | Payer: Medicare (Managed Care) | Source: Home / Self Care | Attending: Cardiology

## 2021-02-09 ENCOUNTER — Ambulatory Visit (HOSPITAL_COMMUNITY)
Admission: RE | Admit: 2021-02-09 | Discharge: 2021-02-10 | Disposition: A | Discharge status: Home / Self Care | Payer: Medicare (Managed Care) | Attending: Cardiology | Admitting: Cardiology

## 2021-02-09 ENCOUNTER — Other Ambulatory Visit: Payer: Self-pay

## 2021-02-09 DIAGNOSIS — I10 Essential (primary) hypertension: Secondary | ICD-10-CM | POA: Diagnosis not present

## 2021-02-09 DIAGNOSIS — Z7985 Long-term (current) use of injectable non-insulin antidiabetic drugs: Secondary | ICD-10-CM | POA: Insufficient documentation

## 2021-02-09 DIAGNOSIS — Z950 Presence of cardiac pacemaker: Secondary | ICD-10-CM | POA: Diagnosis present

## 2021-02-09 DIAGNOSIS — E119 Type 2 diabetes mellitus without complications: Secondary | ICD-10-CM | POA: Insufficient documentation

## 2021-02-09 DIAGNOSIS — I442 Atrioventricular block, complete: Secondary | ICD-10-CM | POA: Diagnosis present

## 2021-02-09 DIAGNOSIS — Z7984 Long term (current) use of oral hypoglycemic drugs: Secondary | ICD-10-CM | POA: Diagnosis not present

## 2021-02-09 HISTORY — PX: PACEMAKER IMPLANT: EP1218

## 2021-02-09 LAB — GLUCOSE, CAPILLARY: Glucose-Capillary: 202 mg/dL — ABNORMAL HIGH (ref 70–99)

## 2021-02-09 SURGERY — PACEMAKER IMPLANT

## 2021-02-09 MED ORDER — LISINOPRIL 20 MG PO TABS
40.0000 mg | ORAL_TABLET | Freq: Every day | ORAL | Status: DC
  Administered 2021-02-10: 40 mg via ORAL
  Filled 2021-02-09: qty 2

## 2021-02-09 MED ORDER — HYDRALAZINE HCL 25 MG PO TABS
25.0000 mg | ORAL_TABLET | Freq: Two times a day (BID) | ORAL | Status: DC
  Administered 2021-02-09 – 2021-02-10 (×2): 25 mg via ORAL
  Filled 2021-02-09 (×2): qty 1

## 2021-02-09 MED ORDER — LIDOCAINE HCL (PF) 1 % IJ SOLN
INTRAMUSCULAR | Status: DC | PRN
  Administered 2021-02-09: 55 mL

## 2021-02-09 MED ORDER — ACETAMINOPHEN 325 MG PO TABS: 325.0000 mg | ORAL_TABLET | ORAL | Status: DC | PRN

## 2021-02-09 MED ORDER — POVIDONE-IODINE 10 % EX SWAB
2.0000 "application " | Freq: Once | CUTANEOUS | Status: AC
  Administered 2021-02-09: 2 via TOPICAL

## 2021-02-09 MED ORDER — FENTANYL CITRATE (PF) 100 MCG/2ML IJ SOLN
INTRAMUSCULAR | Status: DC | PRN
  Administered 2021-02-09 (×2): 25 ug via INTRAVENOUS

## 2021-02-09 MED ORDER — ATORVASTATIN CALCIUM 40 MG PO TABS
40.0000 mg | ORAL_TABLET | Freq: Every day | ORAL | Status: DC
  Administered 2021-02-09 – 2021-02-10 (×2): 40 mg via ORAL
  Filled 2021-02-09 (×2): qty 1

## 2021-02-09 MED ORDER — HEPARIN (PORCINE) IN NACL 1000-0.9 UT/500ML-% IV SOLN
INTRAVENOUS | Status: DC | PRN
  Administered 2021-02-09: 500 mL

## 2021-02-09 MED ORDER — CHLORHEXIDINE GLUCONATE 4 % EX LIQD: 4.0000 "application " | Freq: Once | CUTANEOUS | Status: DC

## 2021-02-09 MED ORDER — SODIUM CHLORIDE 0.9 % IV SOLN: INTRAVENOUS | Status: DC

## 2021-02-09 MED ORDER — AMLODIPINE BESYLATE 5 MG PO TABS
ORAL_TABLET | ORAL | Status: AC
  Filled 2021-02-09: qty 2

## 2021-02-09 MED ORDER — ONDANSETRON HCL 4 MG/2ML IJ SOLN: 4.0000 mg | Freq: Four times a day (QID) | INTRAMUSCULAR | Status: DC | PRN

## 2021-02-09 MED ORDER — HEPARIN (PORCINE) IN NACL 1000-0.9 UT/500ML-% IV SOLN
INTRAVENOUS | Status: AC
  Filled 2021-02-09: qty 500

## 2021-02-09 MED ORDER — LIDOCAINE HCL (PF) 1 % IJ SOLN
INTRAMUSCULAR | Status: AC
  Filled 2021-02-09: qty 60

## 2021-02-09 MED ORDER — MIDAZOLAM HCL 5 MG/5ML IJ SOLN
INTRAMUSCULAR | Status: DC | PRN
  Administered 2021-02-09 (×2): 1 mg via INTRAVENOUS

## 2021-02-09 MED ORDER — FENTANYL CITRATE (PF) 100 MCG/2ML IJ SOLN
INTRAMUSCULAR | Status: AC
  Filled 2021-02-09: qty 2

## 2021-02-09 MED ORDER — CEFAZOLIN SODIUM-DEXTROSE 2-4 GM/100ML-% IV SOLN
2.0000 g | INTRAVENOUS | Status: AC
  Administered 2021-02-09: 2 g via INTRAVENOUS

## 2021-02-09 MED ORDER — CEFAZOLIN SODIUM-DEXTROSE 2-4 GM/100ML-% IV SOLN
INTRAVENOUS | Status: AC
  Filled 2021-02-09: qty 100

## 2021-02-09 MED ORDER — MIDAZOLAM HCL 5 MG/5ML IJ SOLN
INTRAMUSCULAR | Status: AC
  Filled 2021-02-09: qty 5

## 2021-02-09 MED ORDER — SODIUM CHLORIDE 0.9 % IV SOLN
INTRAVENOUS | Status: AC
  Filled 2021-02-09: qty 2

## 2021-02-09 MED ORDER — AMLODIPINE BESYLATE 10 MG PO TABS
10.0000 mg | ORAL_TABLET | Freq: Every day | ORAL | Status: DC
  Administered 2021-02-09 – 2021-02-10 (×2): 10 mg via ORAL
  Filled 2021-02-09: qty 1

## 2021-02-09 MED ORDER — SODIUM CHLORIDE 0.9 % IV SOLN
80.0000 mg | INTRAVENOUS | Status: AC
  Administered 2021-02-09: 80 mg

## 2021-02-09 MED ORDER — PANTOPRAZOLE SODIUM 40 MG PO TBEC
40.0000 mg | DELAYED_RELEASE_TABLET | Freq: Every day | ORAL | Status: DC
  Filled 2021-02-09: qty 1

## 2021-02-09 SURGICAL SUPPLY — 11 items
CABLE SURGICAL S-101-97-12 (CABLE) ×2 IMPLANT
KIT ACCESSORY SELECTRA FIX CVD (MISCELLANEOUS) ×1 IMPLANT
LEAD SELECTRA 3D-55-42 (CATHETERS) ×1 IMPLANT
LEAD SOLIA S PRO MRI 53 (Lead) ×1 IMPLANT
LEAD SOLIA S PRO MRI 60 (Lead) ×1 IMPLANT
PACEMAKER EDORA 8DR-T MRI (Pacemaker) ×1 IMPLANT
PAD DEFIB RADIO PHYSIO CONN (PAD) ×2 IMPLANT
SHEATH 7FR PRELUDE SNAP 13 (SHEATH) ×1 IMPLANT
SHEATH 9FR PRELUDE SNAP 13 (SHEATH) ×1 IMPLANT
SHEATH PROBE COVER 6X72 (BAG) ×1 IMPLANT
TRAY PACEMAKER INSERTION (PACKS) ×2 IMPLANT

## 2021-02-09 NOTE — Discharge Instructions (Addendum)
° ° °  Supplemental Discharge Instructions for  Pacemaker/Defibrillator Patients   Activity No heavy lifting or vigorous activity with your left/right arm for 6 to 8 weeks.  Do not raise your left/right arm above your head for one week.  Gradually raise your affected arm as drawn below.             02/14/21                       02/15/21                     02/16/21                    02/17/21 __  NO DRIVING for  1 week   ; you may begin driving on   07/13/07  .  WOUND CARE Keep the wound area clean and dry.  Do not get this area wet , no showers for one week; you may shower on  02/17/21   . The tape/steri-strips on your wound will fall off; do not pull them off.  No bandage is needed on the site.  DO  NOT apply any creams, oils, or ointments to the wound area. If you notice any drainage or discharge from the wound, any swelling or bruising at the site, or you develop a fever > 101? F after you are discharged home, call the office at once.  Special Instructions You are still able to use cellular telephones; use the ear opposite the side where you have your pacemaker/defibrillator.  Avoid carrying your cellular phone near your device. When traveling through airports, show security personnel your identification card to avoid being screened in the metal detectors.  Ask the security personnel to use the hand wand. Avoid arc welding equipment, MRI testing (magnetic resonance imaging), TENS units (transcutaneous nerve stimulators).  Call the office for questions about other devices. Avoid electrical appliances that are in poor condition or are not properly grounded. Microwave ovens are safe to be near or to operate.

## 2021-02-09 NOTE — H&P (Signed)
Electrophysiology Office Follow up Visit Note:     Date:  02/09/2021    ID:  [name], DOB 1955-11-22, MRN [id number]   PCP:  [facility]          [facility] Cardiologist:  Brian Odea, MD  [facility] Electrophysiologist:  Brian Prude, MD      Interval History:     [name] is a 66 y.o. male who presents for a follow up visit.  I last saw the patient November 12, 2020 for complete heart block when he was in the emergency department.  We held his carvedilol and monitored him.  He was ultimately discharged November 5 after heart rates improved.  He wore a heart monitor after discharge which resulted November 30.  Heart monitor showed second and third-degree AV block.  Since discharge she is actually come back to the emergency department for shortness of breath.  During that ER visit was found to be back in a 2-1 AV block.  With exertion his heart rate was noted to increase.   He tells me that while he wore the heart monitor he had a lot of episodes of shortness of breath and fatigue while trying to exert himself.  The symptoms are not present at rest.   ---------- Doing well today. Plan for DDD PPM.   Objective          Past Medical History:  Diagnosis Date   Dermatophytosis of foot     Diabetes mellitus without complication (HCC)     Hypertension     Hypokalemia     Sebaceous cyst     Vitamin D deficiency             Past Surgical History:  Procedure Laterality Date   APPENDECTOMY          Current Medications: Active Medications      Current Meds  Medication Sig   amLODipine (NORVASC) 10 MG tablet Take 10 mg by mouth daily.   aspirin EC 81 MG EC tablet Take 1 tablet (81 mg total) by mouth daily. Swallow whole.   atorvastatin (LIPITOR) 40 MG tablet Take 40 mg by mouth daily.   glipiZIDE (GLUCOTROL) 10 MG tablet TAKE 1 TABLET BY MOUTH TWICE DAILY FOR BLOOD SUGAR   lisinopril (ZESTRIL) 20 MG tablet Take 1 tablet (20 mg  total) by mouth in the morning and at bedtime.   metFORMIN (GLUCOPHAGE) 1000 MG tablet Take 1,000 mg by mouth 2 (two) times daily.   OZEMPIC, 0.25 OR 0.5 MG/DOSE, 2 MG/1.5ML SOPN Inject 0.5 mg into the skin once a week. Sunday   pantoprazole (PROTONIX) 40 MG tablet Take 1 tablet (40 mg total) by mouth daily.        Allergies:   Patient has no known allergies.    Social History         Socioeconomic History   Marital status: Single      Spouse name: Not on file   Number of children: Not on file   Years of education: Not on file   Highest education level: Not on file  Occupational History   Not on file  Tobacco Use   Smoking status: Former   Smokeless tobacco: Never  Substance and Sexual Activity   Alcohol use: Yes      Comment: occ.   Drug use: Never   Sexual activity: Not on file  Other Topics Concern   Not on file  Social History Narrative  Not on file    Social Determinants of Health    Financial Resource Strain: Not on file  Food Insecurity: Not on file  Transportation Needs: Not on file  Physical Activity: Not on file  Stress: Not on file  Social Connections: Not on file      Family History: The patient's family history includes Heart disease in his mother; Stroke in his father.   ROS:   Please see the history of present illness.    All other systems reviewed and are negative.   EKGs/Labs/Other Studies Reviewed:     The following studies were reviewed today:   December 10, 2020 ZIO monitor personally reviewed Heart rate 31 to 207 bpm, average 66 2 episodes of nonsustained VT, longest lasting 15 beats Fairly good heart rate variability Second third-degree AV block were noted   EKG:  The ekg ordered today demonstrates sinus arrhythmia.  LVH.   Recent Labs: 11/11/2020: TSH 6.895 11/14/2020: Magnesium 2.0 11/27/2020: B Natriuretic Peptide 573.4; BUN 21; Creatinine, Ser 1.58; Hemoglobin 13.1; Platelets 354; Potassium 3.9; Sodium 138  Recent Lipid  Panel Labs (Brief)  No results found for: CHOL, TRIG, HDL, CHOLHDL, VLDL, LDLCALC, LDLDIRECT     Physical Exam:     VS:  BP 134/72    Pulse 79    Ht 5\' 9"  (1.753 m)    Wt 223 lb (101.2 kg)    SpO2 98%    BMI 32.93 kg/m         Wt Readings from Last 3 Encounters:  12/24/20 223 lb (101.2 kg)  11/27/20 215 lb (97.5 kg)  11/17/20 215 lb (97.5 kg)      GEN:  Well nourished, well developed in no acute distress HEENT: Normal NECK: No JVD; No carotid bruits LYMPHATICS: No lymphadenopathy CARDIAC: RRR, no murmurs, rubs, gallops RESPIRATORY:  Clear to auscultation without rales, wheezing or rhonchi  ABDOMEN: Soft, non-tender, non-distended MUSCULOSKELETAL:  No edema; No deformity  SKIN: Warm and dry NEUROLOGIC:  Alert and oriented x 3 PSYCHIATRIC:  Normal affect            Assessment     ASSESSMENT:     1. Complete heart block (HCC)   2. HTN (hypertension), benign   3. Type 2 diabetes mellitus without complication, without long-term current use of insulin (HCC)     PLAN:     In order of problems listed above:   #Paroxysmal complete heart block, advanced AV block Symptomatic with exertional dyspnea and fatigue.  Also with decreased exercise tolerance.  We discussed how pacemaker will likely help the symptoms but I cannot guarantee that the pacemaker alleviate all of his symptoms.  We discussed the pacemaker procedure in detail including the risks and recovery and he wishes to proceed.  We are checking with his insurance company to see what the out-of-pocket cost will be for him.  We discussed this also during today's appointment.  If the cost is prohibitive, we will get him seen by another local electrophysiologist.   Plan for left bundle area dual-chamber pacemaker.  Plan for Biotronik.   Risks, benefits, alternatives to PPM implantation were discussed in detail with the patient today. The patient understands that the risks include but are not limited to bleeding, infection,  pneumothorax, perforation, tamponade, vascular damage, renal failure, MI, stroke, death, and lead dislodgement and wishes to proceed.  We will therefore schedule device implantation at the next available time.         Medication Adjustments/Labs and Tests  Ordered: Current medicines are reviewed at length with the patient today.  Concerns regarding medicines are outlined above.  No orders of the defined types were placed in this encounter.   No orders of the defined types were placed in this encounter.       Signed, Steffanie Dunnameron Maisen Klingler, MD, [facility] Army [facility] HospitalFACC, [facility] Center[facility] 12/24/2020 9:37 AM    Electrophysiology [facility]       -----------------------  I have seen, examined the patient, and reviewed the above assessment and plan.    Plan for DDD PPM today.   Brian PrudeAMERON T October Peery, MD 02/09/2021 5:08 PM

## 2021-02-10 ENCOUNTER — Encounter (HOSPITAL_COMMUNITY): Payer: Self-pay | Admitting: Cardiology

## 2021-02-10 ENCOUNTER — Ambulatory Visit (HOSPITAL_COMMUNITY): Payer: Medicare (Managed Care)

## 2021-02-10 DIAGNOSIS — Z7985 Long-term (current) use of injectable non-insulin antidiabetic drugs: Secondary | ICD-10-CM | POA: Diagnosis not present

## 2021-02-10 DIAGNOSIS — Z950 Presence of cardiac pacemaker: Secondary | ICD-10-CM | POA: Diagnosis not present

## 2021-02-10 DIAGNOSIS — E119 Type 2 diabetes mellitus without complications: Secondary | ICD-10-CM | POA: Diagnosis not present

## 2021-02-10 DIAGNOSIS — I10 Essential (primary) hypertension: Secondary | ICD-10-CM | POA: Diagnosis not present

## 2021-02-10 DIAGNOSIS — I442 Atrioventricular block, complete: Secondary | ICD-10-CM | POA: Diagnosis not present

## 2021-02-10 NOTE — Discharge Summary (Signed)
ELECTROPHYSIOLOGY PROCEDURE DISCHARGE SUMMARY    Patient ID: [name],  MRN: [id number], DOB/AGE: 1955-11-13 66 y.o.  Admit date: 02/09/2021 Discharge date: 02/10/2021  Primary Care Physician: [facility]  Primary Cardiologist: Dr. Azucena Cecil Electrophysiologist: Dr. Lalla Brothers  Primary Discharge Diagnosis:  Paroxysmal CHB  Secondary Discharge Diagnosis:  DM HTN   No Known Allergies   Procedures This Admission:  1.  Implantation of a Biotronik dual chamber PPM on 02/09/21 by Dr Lalla Brothers.  The patient received   pulse generator (model Edora 8 DR-T, serial [id number]),  RV mid septum (model Solia S 60, serial [id number]), right atrial lead (model Solia S 53, serial [id number]) There were no immediate post procedure complications. 2.  CXR on 02/10/21 demonstrated no pneumothorax status post device implantation.   Brief HPI: [name] is a 66 y.o. male was referred to electrophysiology in the outpatient setting for consideration of PPM implantation.  Past medical history includes above with intermittent symptomatic CHB/advanced heart block despite stopping nodal blocking agents.  The patient has had symptomatic bradycardia without reversible causes identified.  Risks, benefits, and alternatives to PPM implantation were reviewed with the patient who wished to proceed.   Hospital Course:  The patient was admitted and underwent implantation of a PPM with details as outlined above.  He was monitored on telemetry overnight which demonstrated SR.  Left chest was without hematoma or ecchymosis.  The device was interrogated and found to be functioning normally.  CXR was obtained and demonstrated no pneumothorax status post device implantation.  Wound care, arm mobility, and restrictions were reviewed with the patient.  The patient feels well, denies any CP/SOB, with minimal site discomfort.  He was examined by Dr. Lalla Brothers  and considered stable for discharge to  home.    Physical Exam: Vitals:   02/09/21 2136 02/09/21 2148 02/10/21 0526 02/10/21 0818  BP: (!) 149/86 (!) 149/86 (!) 146/91 (!) 164/100  Pulse: 76 76 68   Resp: 18 18 14    Temp:  98 F (36.7 C) 98.2 F (36.8 C)   TempSrc:   Oral   SpO2: 95%  96%   Weight:      Height:        GEN- The patient is well appearing, alert and oriented x 3 today.   HEENT: normocephalic, atraumatic; sclera clear, conjunctiva pink; hearing intact; oropharynx clear; neck supple, no JVP Lungs- CTA b/l, normal work of breathing.  No wheezes, rales, rhonchi Heart- RRR, no murmurs, rubs or gallops, PMI not laterally displaced GI- soft, non-tender, non-distended Extremities- no clubbing, cyanosis, or edema MS- no significant deformity or atrophy Skin- warm and dry, no rash or lesion, left chest without hematoma/ecchymosis Psych- euthymic mood, full affect Neuro- no gross deficits   Labs:   Lab Results  Component Value Date   WBC 10.6 01/30/2021   HGB 13.2 01/30/2021   HCT 38.9 01/30/2021   MCV 82 01/30/2021   PLT 327 01/30/2021   No results for input(s): NA, K, CL, CO2, BUN, CREATININE, CALCIUM, PROT, BILITOT, ALKPHOS, ALT, AST, GLUCOSE in the last 168 hours.  Invalid input(s): LABALBU  Discharge Medications:  Allergies as of 02/10/2021   No Known Allergies      Medication List     TAKE these medications    amLODipine 10 MG tablet Commonly known as: NORVASC Take 10 mg by mouth daily.   aspirin 81 MG EC tablet Take 1 tablet (81 mg total) by mouth daily. Swallow whole.  atorvastatin 40 MG tablet Commonly known as: LIPITOR Take 40 mg by mouth daily.   furosemide 20 MG tablet Commonly known as: LASIX Take 20 mg by mouth daily.   glipiZIDE 10 MG tablet Commonly known as: GLUCOTROL Take 10 mg by mouth 2 (two) times daily before a meal.   hydrALAZINE 25 MG tablet Commonly known as: APRESOLINE Take 1 tablet (25 mg total) by mouth 2 (two) times daily.   lisinopril 40 MG  tablet Commonly known as: ZESTRIL Take 1 tablet (40 mg total) by mouth daily.   metFORMIN 1000 MG tablet Commonly known as: GLUCOPHAGE Take 1,000 mg by mouth 2 (two) times daily.   Ozempic (0.25 or 0.5 MG/DOSE) 2 MG/1.5ML Sopn Generic drug: Semaglutide(0.25 or 0.5MG /DOS) Inject 0.5 mg into the skin once a week. Sunday   pantoprazole 40 MG tablet Commonly known as: PROTONIX Take 1 tablet (40 mg total) by mouth daily.        Disposition: Home Discharge Instructions     Diet - low sodium heart healthy   Complete by: As directed    Increase activity slowly   Complete by: As directed         Duration of Discharge Encounter: Greater than 30 minutes including physician time.  Norma Fredrickson, PA-C 02/10/2021 9:19 AM

## 2021-02-19 ENCOUNTER — Other Ambulatory Visit: Payer: Self-pay

## 2021-02-19 ENCOUNTER — Ambulatory Visit (INDEPENDENT_AMBULATORY_CARE_PROVIDER_SITE_OTHER): Payer: Medicare (Managed Care)

## 2021-02-19 DIAGNOSIS — I442 Atrioventricular block, complete: Secondary | ICD-10-CM

## 2021-02-19 LAB — CUP PACEART INCLINIC DEVICE CHECK
Brady Statistic RA Percent Paced: 3 %
Brady Statistic RV Percent Paced: 18 %
Date Time Interrogation Session: 20230209171549
Implantable Lead Implant Date: 20230130
Implantable Lead Implant Date: 20230130
Implantable Lead Location: 753859
Implantable Lead Location: 753860
Implantable Lead Model: 377171
Implantable Lead Model: 377171
Implantable Lead Serial Number: 7000391305
Implantable Lead Serial Number: 8000666643
Implantable Pulse Generator Implant Date: 20230130
Lead Channel Impedance Value: 468 Ohm
Lead Channel Impedance Value: 565 Ohm
Lead Channel Pacing Threshold Amplitude: 0.5 V
Lead Channel Pacing Threshold Amplitude: 0.5 V
Lead Channel Pacing Threshold Amplitude: 1.1 V
Lead Channel Pacing Threshold Amplitude: 1.1 V
Lead Channel Pacing Threshold Amplitude: 1.3 V
Lead Channel Pacing Threshold Pulse Width: 0.4 ms
Lead Channel Pacing Threshold Pulse Width: 0.4 ms
Lead Channel Pacing Threshold Pulse Width: 0.4 ms
Lead Channel Pacing Threshold Pulse Width: 0.4 ms
Lead Channel Pacing Threshold Pulse Width: 0.4 ms
Lead Channel Sensing Intrinsic Amplitude: 2.6 mV
Lead Channel Sensing Intrinsic Amplitude: 2.9 mV
Lead Channel Sensing Intrinsic Amplitude: 8.2 mV
Lead Channel Sensing Intrinsic Amplitude: 8.8 mV
Lead Channel Setting Pacing Amplitude: 3 V
Lead Channel Setting Pacing Amplitude: 3 V
Lead Channel Setting Pacing Pulse Width: 0.4 ms
Pulse Gen Model: 407145
Pulse Gen Serial Number: 70278108

## 2021-02-19 NOTE — Patient Instructions (Signed)

## 2021-02-19 NOTE — Progress Notes (Signed)
Wound check appointment. Steri-strips removed. Wound without redness or edema. Incision edges approximated, wound well healed. Normal device function. Thresholds, sensing, and impedances consistent with implant measurements. Device programmed at 3.0V programmed on for extra safety margin until 3 month visit. Histogram distribution appropriate for patient and level of activity. No mode switches or high ventricular rates noted. Patient educated about wound care, arm mobility, lifting restrictions. ROV 05/13/21 with CL

## 2021-03-05 ENCOUNTER — Other Ambulatory Visit: Payer: Self-pay

## 2021-03-05 ENCOUNTER — Ambulatory Visit (INDEPENDENT_AMBULATORY_CARE_PROVIDER_SITE_OTHER): Payer: Medicare (Managed Care)

## 2021-03-05 DIAGNOSIS — R079 Chest pain, unspecified: Secondary | ICD-10-CM | POA: Diagnosis not present

## 2021-03-05 LAB — ECHOCARDIOGRAM COMPLETE
AR max vel: 2.83 cm2
AV Area VTI: 2.25 cm2
AV Area mean vel: 2.39 cm2
AV Mean grad: 5 mmHg
AV Peak grad: 8 mmHg
Ao pk vel: 1.41 m/s
S' Lateral: 4 cm

## 2021-03-05 MED ORDER — PERFLUTREN LIPID MICROSPHERE
1.0000 mL | INTRAVENOUS | Status: AC | PRN
  Administered 2021-03-05: 2 mL via INTRAVENOUS

## 2021-04-02 ENCOUNTER — Encounter: Payer: Self-pay | Admitting: Cardiology

## 2021-04-02 ENCOUNTER — Other Ambulatory Visit: Payer: Self-pay

## 2021-04-02 ENCOUNTER — Ambulatory Visit (INDEPENDENT_AMBULATORY_CARE_PROVIDER_SITE_OTHER): Payer: Self-pay | Admitting: Cardiology

## 2021-04-02 VITALS — BP 150/86 | HR 78 | Ht 69.0 in | Wt 227.0 lb

## 2021-04-02 DIAGNOSIS — I10 Essential (primary) hypertension: Secondary | ICD-10-CM

## 2021-04-02 DIAGNOSIS — E7800 Pure hypercholesterolemia, unspecified: Secondary | ICD-10-CM

## 2021-04-02 DIAGNOSIS — I442 Atrioventricular block, complete: Secondary | ICD-10-CM

## 2021-04-02 MED ORDER — HYDRALAZINE HCL 50 MG PO TABS: 50.0000 mg | ORAL_TABLET | Freq: Two times a day (BID) | ORAL | 5 refills | Status: DC

## 2021-04-02 NOTE — Patient Instructions (Signed)
Medication Instructions:  ? ?Your physician has recommended you make the following change in your medication:  ? ? INCREASE your Hydralazine to 50 MG twice a day. ?  ?*If you need a refill on your cardiac medications before your next appointment, please call your pharmacy* ? ? ?Lab Work: ? ?None ordered ? ?If you have labs (blood work) drawn today and your tests are completely normal, you will receive your results only by: ?MyChart Message (if you have MyChart) OR ?A paper copy in the mail ?If you have any lab test that is abnormal or we need to change your treatment, we will call you to review the results. ? ? ?Testing/Procedures: ? ?None ordered ? ? ?Follow-Up: ?At [facility], you and your health needs are our priority.  As part of our continuing mission to provide you with exceptional heart care, we have created designated Provider Care Teams.  These Care Teams include your primary Cardiologist (physician) and Advanced Practice Providers (APPs -  Physician Assistants and Nurse Practitioners) who all work together to provide you with the care you need, when you need it. ? ?We recommend signing up for the patient portal called "MyChart".  Sign up information is provided on this After Visit Summary.  MyChart is used to connect with patients for Virtual Visits (Telemedicine).  Patients are able to view lab/test results, encounter notes, upcoming appointments, etc.  Non-urgent messages can be sent to your provider as well.   ?To learn more about what you can do with MyChart, go to [email or web address].   ? ?Your next appointment:   ?2 month(s) ? ?The format for your next appointment:   ?In Person ? ?Provider:   ?You may see Debbe Odea, MD or one of the following Advanced Practice Providers on your designated Care Team:   ?Nicolasa Ducking, NP ?Eula Listen, PA-C ?Cadence Fransico Michael, PA-C  ? ? ?Other Instructions ? ? ?

## 2021-04-02 NOTE — Progress Notes (Signed)
?Cardiology Office Note:   ? ?Date:  04/02/2021  ? ?ID:  [name], DOB May 06, 1955, MRN [id number] ? ?PCP:  Center, [facility] ?  ?[facility] Providers ?Cardiologist:  Debbe Odea, MD ?Electrophysiologist:  Lanier Prude, MD    ? ?Referring MD: Center, [facility]*  ? ?Chief Complaint  ?Patient presents with  ? Other  ?  3 motnh follow up -- Meds reviewed verbally with patient.   ? ? ?History of Present Illness:   ? ?[name] is a 66 y.o. male with a hx of hypertension, hyperlipidemia, diabetes, CHB (s/p PPM [place] 01/2021) who presents for follow-up. ? ?Previously seen due to complete heart block and hypertension.  Evaluated by EP, permanent pacemaker was placed.  Feels well, pacemaker incision site is well-healed.  Blood pressure still running a little higher at home.  Denies edema.  He watches his salt intake. ? ? ?Prior notes ?Echocardiogram 11/2020 normal systolic and diastolic function, mild LVH, EF 65 to 70% ?Cardiac monitor 11/2020 episodes of Mobitz 2 and third-degree AV block present ? ?Past Medical History:  ?Diagnosis Date  ? Dermatophytosis of foot   ? Diabetes mellitus without complication (HCC)   ? Hypertension   ? Hypokalemia   ? Sebaceous cyst   ? Vitamin D deficiency   ? ? ?Past Surgical History:  ?Procedure Laterality Date  ? APPENDECTOMY    ? PACEMAKER IMPLANT N/A 02/09/2021  ? Procedure: PACEMAKER IMPLANT;  Surgeon: Lanier Prude, MD;  Location: [facility] INVASIVE CV LAB;  Service: Cardiovascular;  Laterality: N/A;  ? ? ?Current Medications: ?Current Meds  ?Medication Sig  ? amLODipine (NORVASC) 10 MG tablet Take 10 mg by mouth daily.  ? aspirin EC 81 MG EC tablet Take 1 tablet (81 mg total) by mouth daily. Swallow whole.  ? atorvastatin (LIPITOR) 40 MG tablet Take 40 mg by mouth daily.  ? furosemide (LASIX) 20 MG tablet Take 20 mg by mouth daily.  ? glipiZIDE (GLUCOTROL) 10 MG tablet Take 10 mg by mouth 2 (two) times daily before a meal.  ? lisinopril  (ZESTRIL) 40 MG tablet Take 1 tablet (40 mg total) by mouth daily.  ? metFORMIN (GLUCOPHAGE) 1000 MG tablet Take 1,000 mg by mouth 2 (two) times daily.  ? OZEMPIC, 0.25 OR 0.5 MG/DOSE, 2 MG/1.5ML SOPN Inject 0.5 mg into the skin once a week. Sunday  ? pantoprazole (PROTONIX) 40 MG tablet Take 1 tablet (40 mg total) by mouth daily.  ? [DISCONTINUED] hydrALAZINE (APRESOLINE) 25 MG tablet Take 1 tablet (25 mg total) by mouth 2 (two) times daily.  ?  ? ?Allergies:   Patient has no known allergies.  ? ?Social History  ? ?Socioeconomic History  ? Marital status: Single  ?  Spouse name: Not on file  ? Number of children: Not on file  ? Years of education: Not on file  ? Highest education level: Not on file  ?Occupational History  ? Not on file  ?Tobacco Use  ? Smoking status: Former  ? Smokeless tobacco: Never  ?Substance and Sexual Activity  ? Alcohol use: Yes  ?  Comment: occ.  ? Drug use: Never  ? Sexual activity: Not on file  ?Other Topics Concern  ? Not on file  ?Social History Narrative  ? Not on file  ? ?Social Determinants of Health  ? ?Financial Resource Strain: Not on file  ?Food Insecurity: Not on file  ?Transportation Needs: Not on file  ?Physical Activity: Not on file  ?  Stress: Not on file  ?Social Connections: Not on file  ?  ? ?Family History: ?The patient's family history includes Heart disease in his mother; Stroke in his father. ? ?ROS:   ?Please see the history of present illness.    ? All other systems reviewed and are negative. ? ?EKGs/Labs/Other Studies Reviewed:   ? ?The following studies were reviewed today: ? ? ?EKG:  EKG is  ordered today.  The ekg ordered today demonstrates a sensed V paced rhythm ? ?Recent Labs: ?11/11/2020: TSH 6.895 ?11/14/2020: Magnesium 2.0 ?11/27/2020: B Natriuretic Peptide 573.4 ?01/30/2021: BUN 20; Creatinine, Ser 1.27; Hemoglobin 13.2; Platelets 327; Potassium 4.2; Sodium 140  ?Recent Lipid Panel ?No results found for: CHOL, TRIG, HDL, CHOLHDL, VLDL, LDLCALC,  LDLDIRECT ? ? ?Risk Assessment/Calculations:   ? ?    ? ?Physical Exam:   ? ?VS:  BP (!) 150/86 (BP Location: Left Arm, Patient Position: Sitting, Cuff Size: Normal)   Pulse 78   Ht 5\' 9"  (1.753 m)   Wt 227 lb (103 kg)   SpO2 98%   BMI 33.52 kg/m?    ? ?Wt Readings from Last 3 Encounters:  ?04/02/21 227 lb (103 kg)  ?02/09/21 216 lb 6.4 oz (98.2 kg)  ?12/31/20 227 lb (103 kg)  ?  ? ?GEN:  Well nourished, well developed in no acute distress ?HEENT: Normal ?NECK: No JVD; No carotid bruits ?CARDIAC: RRR, no murmurs, rubs, gallops ?RESPIRATORY:  Clear to auscultation without rales, wheezing or rhonchi  ?ABDOMEN: Soft, non-tender, non-distended ?MUSCULOSKELETAL:  No edema; No deformity  ?SKIN: Warm and dry ?NEUROLOGIC:  Alert and oriented x 3 ?PSYCHIATRIC:  Normal affect  ? ?ASSESSMENT:   ? ?1. Complete heart block (HCC)   ?2. Primary hypertension   ?3. Pure hypercholesterolemia   ? ? ?PLAN:   ? ?In order of problems listed above: ? ?Complete heart block s/p PPM 01/2021 .  EKG shows a sensed V paced rhythm, pacemaker appears to be functioning normally.  Keep appointment with device clinic for frequent pacemaker checks.  Echo 11/2020 EF 65%. ?Hypertension, BP elevated.  Increase hydralazine to 50 mg twice daily, continue lisinopril 40 mg daily, amlodipine 10 mg daily.   ?Hyperlipidemia, continue Lipitor. ? ?Follow-up in 2 months. ?  ? ? ?Medication Adjustments/Labs and Tests Ordered: ?Current medicines are reviewed at length with the patient today.  Concerns regarding medicines are outlined above.  ?Orders Placed This Encounter  ?Procedures  ? EKG 12-Lead  ? ? ?Meds ordered this encounter  ?Medications  ? hydrALAZINE (APRESOLINE) 50 MG tablet  ?  Sig: Take 1 tablet (50 mg total) by mouth 2 (two) times daily.  ?  Dispense:  60 tablet  ?  Refill:  5  ? ? ? ?Patient Instructions  ?Medication Instructions:  ? ?Your physician has recommended you make the following change in your medication:  ? ? INCREASE your Hydralazine  to 50 MG twice a day. ?  ?*If you need a refill on your cardiac medications before your next appointment, please call your pharmacy* ? ? ?Lab Work: ? ?None ordered ? ?If you have labs (blood work) drawn today and your tests are completely normal, you will receive your results only by: ?MyChart Message (if you have MyChart) OR ?A paper copy in the mail ?If you have any lab test that is abnormal or we need to change your treatment, we will call you to review the results. ? ? ?Testing/Procedures: ? ?None ordered ? ? ?Follow-Up: ?[facility],  you and your health needs are our priority.  As part of our continuing mission to provide you with exceptional heart care, we have created designated Provider Care Teams.  These Care Teams include your primary Cardiologist (physician) and Advanced Practice Providers (APPs -  Physician Assistants and Nurse Practitioners) who all work together to provide you with the care you need, when you need it. ? ?We recommend signing up for the patient portal called "MyChart".  Sign up information is provided on this After Visit Summary.  MyChart is used to connect with patients for Virtual Visits (Telemedicine).  Patients are able to view lab/test results, encounter notes, upcoming appointments, etc.  Non-urgent messages can be sent to your provider as well.   ?To learn more about what you can do with MyChart, go to [email or web address].   ? ?Your next appointment:   ?2 month(s) ? ?The format for your next appointment:   ?In Person ? ?Provider:   ?You may see Debbe Odea, MD or one of the following Advanced Practice Providers on your designated Care Team:   ?Nicolasa Ducking, NP ?Eula Listen, PA-C ?Cadence Fransico Michael, PA-C  ? ? ?Other Instructions ? ?  ? ?Signed, ?Debbe Odea, MD  ?04/02/2021 12:13 PM    ?[facility] ? ?

## 2021-05-10 NOTE — Progress Notes (Signed)
?Electrophysiology Office Follow up Visit Note:   ? ?Date:  05/13/2021  ? ?ID:  [name], DOB 07-15-1955, MRN [id number] ? ?PCP:  Center, [facility]  ?[facility] Cardiologist:  Debbe Odea, MD  ?[facility] Electrophysiologist:  Lanier Prude, MD  ? ? ?Interval History:   ? ?[name] is a 66 y.o. male who presents for a follow up visit after 02/09/2021 pacemaker implant. He has done well after implant.  ?He was seen by Dr Azucena Cecil 04/02/2021. PPM checks have shown normal function of the device.  ?He tells me is done well since implant.  The wound is healed well. ? ?  ? ?Past Medical History:  ?Diagnosis Date  ? Dermatophytosis of foot   ? Diabetes mellitus without complication (HCC)   ? Hypertension   ? Hypokalemia   ? Sebaceous cyst   ? Vitamin D deficiency   ? ? ?Past Surgical History:  ?Procedure Laterality Date  ? APPENDECTOMY    ? PACEMAKER IMPLANT N/A 02/09/2021  ? Procedure: PACEMAKER IMPLANT;  Surgeon: Lanier Prude, MD;  Location: [facility] INVASIVE CV LAB;  Service: Cardiovascular;  Laterality: N/A;  ? ? ?Current Medications: ?Current Meds  ?Medication Sig  ? amLODipine (NORVASC) 10 MG tablet Take 10 mg by mouth daily.  ? aspirin EC 81 MG EC tablet Take 1 tablet (81 mg total) by mouth daily. Swallow whole.  ? atorvastatin (LIPITOR) 40 MG tablet Take 40 mg by mouth daily.  ? furosemide (LASIX) 20 MG tablet Take 20 mg by mouth daily.  ? glipiZIDE (GLUCOTROL) 10 MG tablet Take 10 mg by mouth 2 (two) times daily before a meal.  ? hydrALAZINE (APRESOLINE) 50 MG tablet Take 1 tablet (50 mg total) by mouth 2 (two) times daily.  ? lisinopril (ZESTRIL) 40 MG tablet Take 1 tablet (40 mg total) by mouth daily.  ? metFORMIN (GLUCOPHAGE) 1000 MG tablet Take 1,000 mg by mouth 2 (two) times daily.  ? OZEMPIC, 0.25 OR 0.5 MG/DOSE, 2 MG/1.5ML SOPN Inject 0.5 mg into the skin once a week. Sunday  ? pantoprazole (PROTONIX) 40 MG tablet Take 1 tablet (40 mg total) by mouth daily.  ?   ? ?Allergies:   Patient has no known allergies.  ? ?Social History  ? ?Socioeconomic History  ? Marital status: Single  ?  Spouse name: Not on file  ? Number of children: Not on file  ? Years of education: Not on file  ? Highest education level: Not on file  ?Occupational History  ? Not on file  ?Tobacco Use  ? Smoking status: Former  ? Smokeless tobacco: Never  ?Substance and Sexual Activity  ? Alcohol use: Yes  ?  Comment: occ.  ? Drug use: Never  ? Sexual activity: Not on file  ?Other Topics Concern  ? Not on file  ?Social History Narrative  ? Not on file  ? ?Social Determinants of Health  ? ?Financial Resource Strain: Not on file  ?Food Insecurity: Not on file  ?Transportation Needs: Not on file  ?Physical Activity: Not on file  ?Stress: Not on file  ?Social Connections: Not on file  ?  ? ?Family History: ?The patient's family history includes Heart disease in his mother; Stroke in his father. ? ?ROS:   ?Please see the history of present illness.    ?All other systems reviewed and are negative. ? ?EKGs/Labs/Other Studies Reviewed:   ? ?The following studies were reviewed today: ? ?May 13, 2021 in  clinic device interrogation personally reviewed shows stable device function.  Good battery longevity between 8 and 9 years.  Ventricular pacing 86% ? ?EKG:  The ekg ordered today demonstrates a sensed, V paced with prolonged AV conduction. ? ?Recent Labs: ?11/11/2020: TSH 6.895 ?11/14/2020: Magnesium 2.0 ?11/27/2020: B Natriuretic Peptide 573.4 ?01/30/2021: BUN 20; Creatinine, Ser 1.27; Hemoglobin 13.2; Platelets 327; Potassium 4.2; Sodium 140  ?Recent Lipid Panel ?No results found for: CHOL, TRIG, HDL, CHOLHDL, VLDL, LDLCALC, LDLDIRECT ? ?Physical Exam:   ? ?VS:  BP (!) 142/80   Pulse 79   Ht 5\' 9"  (1.753 m)   Wt 226 lb (102.5 kg)   SpO2 99%   BMI 33.37 kg/m?    ? ?Wt Readings from Last 3 Encounters:  ?05/13/21 226 lb (102.5 kg)  ?04/02/21 227 lb (103 kg)  ?02/09/21 216 lb 6.4 oz (98.2 kg)  ?  ? ?GEN:  Well  nourished, well developed in no acute distress ?HEENT: Normal ?NECK: No JVD; No carotid bruits ?LYMPHATICS: No lymphadenopathy ?CARDIAC: RRR, no murmurs, rubs, gallops.  Pacemaker pocket healed well. ?RESPIRATORY:  Clear to auscultation without rales, wheezing or rhonchi  ?ABDOMEN: Soft, non-tender, non-distended ?MUSCULOSKELETAL:  No edema; No deformity  ?SKIN: Warm and dry ?NEUROLOGIC:  Alert and oriented x 3 ?PSYCHIATRIC:  Normal affect  ? ? ? ?  ? ?ASSESSMENT:   ? ?1. Complete heart block (HCC)   ?2. Primary hypertension   ?3. Cardiac pacemaker in situ   ? ?PLAN:   ? ?In order of problems listed above: ? ?#CHB ?#PPM in situ ?Device functioning appropriately.  Continue remote monitoring. ? ?#Hypertension ?Slightly above goal today.  I have asked him to check his blood pressures 1-2 times per week and record these values.  He should bring them to his primary care physician for further medication titration. ? ?Follow-up with our clinic in 9 months or sooner as needed.  APP appointment okay. ? ? ? ? ?Medication Adjustments/Labs and Tests Ordered: ?Current medicines are reviewed at length with the patient today.  Concerns regarding medicines are outlined above.  ?No orders of the defined types were placed in this encounter. ? ?No orders of the defined types were placed in this encounter. ? ? ? ?Signed, ?02/11/21, MD, [facility], [facility] ?05/13/2021 1:50 PM    ?Electrophysiology [facility] ?

## 2021-05-12 ENCOUNTER — Ambulatory Visit (INDEPENDENT_AMBULATORY_CARE_PROVIDER_SITE_OTHER): Payer: Medicare (Managed Care)

## 2021-05-12 DIAGNOSIS — I442 Atrioventricular block, complete: Secondary | ICD-10-CM | POA: Diagnosis not present

## 2021-05-12 LAB — CUP PACEART REMOTE DEVICE CHECK
Date Time Interrogation Session: 20230502102241
Implantable Lead Implant Date: 20230130
Implantable Lead Implant Date: 20230130
Implantable Lead Location: 753859
Implantable Lead Location: 753860
Implantable Lead Model: 377171
Implantable Lead Model: 377171
Implantable Lead Serial Number: 7000391305
Implantable Lead Serial Number: 8000666643
Implantable Pulse Generator Implant Date: 20230130
Pulse Gen Model: 407145
Pulse Gen Serial Number: 70278108

## 2021-05-13 ENCOUNTER — Ambulatory Visit (INDEPENDENT_AMBULATORY_CARE_PROVIDER_SITE_OTHER): Payer: Medicare (Managed Care) | Admitting: Cardiology

## 2021-05-13 ENCOUNTER — Encounter: Payer: Self-pay | Admitting: Cardiology

## 2021-05-13 VITALS — BP 142/80 | HR 79 | Ht 69.0 in | Wt 226.0 lb

## 2021-05-13 DIAGNOSIS — I442 Atrioventricular block, complete: Secondary | ICD-10-CM

## 2021-05-13 DIAGNOSIS — I10 Essential (primary) hypertension: Secondary | ICD-10-CM | POA: Diagnosis not present

## 2021-05-13 DIAGNOSIS — Z950 Presence of cardiac pacemaker: Secondary | ICD-10-CM | POA: Diagnosis not present

## 2021-05-13 NOTE — Patient Instructions (Addendum)
Medications: ?No Changes ?*If you need a refill on your cardiac medications before your next appointment, please call your pharmacy* ? ?Lab Work: ?None. ? ?Follow-Up: ?At [facility], you and your health needs are our priority.  As part of our continuing mission to provide you with exceptional heart care, we have created designated Provider Care Teams.  These Care Teams include your primary Cardiologist (physician) and Advanced Practice Providers (APPs -  Physician Assistants and Nurse Practitioners) who all work together to provide you with the care you need, when you need it. ? ?Your physician wants you to follow-up in: 9 months with Dr. Phillips Odor will receive a reminder letter in the mail two months in advance. If you don't receive a letter, please call our office to schedule the follow-up appointment. ? ?We recommend signing up for the patient portal called "MyChart".  Sign up information is provided on this After Visit Summary.  MyChart is used to connect with patients for Virtual Visits (Telemedicine).  Patients are able to view lab/test results, encounter notes, upcoming appointments, etc.  Non-urgent messages can be sent to your provider as well.   ?To learn more about what you can do with MyChart, go to [email or web address].   ? ?Any Other Special Instructions Will Be Listed Below (If Applicable).  ?

## 2021-05-26 NOTE — Progress Notes (Signed)
Remote pacemaker transmission.   

## 2021-06-04 ENCOUNTER — Ambulatory Visit (INDEPENDENT_AMBULATORY_CARE_PROVIDER_SITE_OTHER): Payer: Medicare (Managed Care) | Admitting: Cardiology

## 2021-06-04 ENCOUNTER — Encounter: Payer: Self-pay | Admitting: Cardiology

## 2021-06-04 VITALS — BP 146/104 | HR 76 | Ht 69.0 in | Wt 226.0 lb

## 2021-06-04 DIAGNOSIS — I442 Atrioventricular block, complete: Secondary | ICD-10-CM

## 2021-06-04 DIAGNOSIS — I10 Essential (primary) hypertension: Secondary | ICD-10-CM | POA: Diagnosis not present

## 2021-06-04 DIAGNOSIS — R5383 Other fatigue: Secondary | ICD-10-CM

## 2021-06-04 MED ORDER — HYDRALAZINE HCL 50 MG PO TABS: 50.0000 mg | ORAL_TABLET | Freq: Three times a day (TID) | ORAL | 5 refills | Status: DC

## 2021-06-04 MED ORDER — FUROSEMIDE 20 MG PO TABS: 20.0000 mg | ORAL_TABLET | ORAL | 3 refills | Status: DC | PRN

## 2021-06-04 NOTE — Progress Notes (Signed)
Cardiology Office Note:    Date:  06/04/2021   ID:  ESAI STECKLEIN, DOB [birth year], MRN [id number]  PCP:  Center, [facility] Providers Cardiologist:  Debbe Odea, MD Electrophysiologist:  Lanier Prude, MD     Referring MD: Center, [facility]*   Chief Complaint  Patient presents with   Follow-up    History of Present Illness:    [name] is a 66 y.o. male with a hx of hypertension, hyperlipidemia, diabetes, CHB (s/p PPM Biotronik 01/2021) who presents for follow-up.  Being seen for hypertension, complete heart block.  Follows up with device clinic for pacemaker checks.  BP at home have been elevated systolics up to 170s in the AM.  Takes medications as prescribed.  Hydralazine previously started on 50 mg twice daily.  Takes Lasix daily.   Prior notes Echocardiogram 11/2020 normal systolic and diastolic function, mild LVH, EF 65 to 70% Cardiac monitor 11/2020 episodes of Mobitz 2 and third-degree AV block present  Past Medical History:  Diagnosis Date   Dermatophytosis of foot    Diabetes mellitus without complication (HCC)    Hypertension    Hypokalemia    Sebaceous cyst    Vitamin D deficiency     Past Surgical History:  Procedure Laterality Date   APPENDECTOMY     PACEMAKER IMPLANT N/A 02/09/2021   Procedure: PACEMAKER IMPLANT;  Surgeon: Lanier Prude, MD;  [facility];  Service: Cardiovascular;  Laterality: N/A;    Current Medications: Current Meds  Medication Sig   amLODipine (NORVASC) 10 MG tablet Take 10 mg by mouth daily.   aspirin EC 81 MG EC tablet Take 1 tablet (81 mg total) by mouth daily. Swallow whole.   atorvastatin (LIPITOR) 40 MG tablet Take 40 mg by mouth daily.   glipiZIDE (GLUCOTROL) 10 MG tablet Take 10 mg by mouth 2 (two) times daily before a meal.   liraglutide (VICTOZA) 18 MG/3ML SOPN Inject 1.8 mg into the skin daily.   lisinopril (ZESTRIL) 40 MG tablet Take 1 tablet  (40 mg total) by mouth daily.   metFORMIN (GLUCOPHAGE) 1000 MG tablet Take 1,000 mg by mouth 2 (two) times daily.   pantoprazole (PROTONIX) 40 MG tablet Take 1 tablet (40 mg total) by mouth daily.   [DISCONTINUED] furosemide (LASIX) 20 MG tablet Take 20 mg by mouth daily.   [DISCONTINUED] hydrALAZINE (APRESOLINE) 50 MG tablet Take 1 tablet (50 mg total) by mouth 2 (two) times daily.     Allergies:   Patient has no known allergies.   Social History   Socioeconomic History   Marital status: Single    Spouse name: Not on file   Number of children: Not on file   Years of education: Not on file   Highest education level: Not on file  Occupational History   Not on file  Tobacco Use   Smoking status: Former   Smokeless tobacco: Never  Substance and Sexual Activity   Alcohol use: Yes    Comment: occ.   Drug use: Never   Sexual activity: Not on file  Other Topics Concern   Not on file  Social History Narrative   Not on file   Social Determinants of Health   Financial Resource Strain: Not on file  Food Insecurity: Not on file  Transportation Needs: Not on file  Physical Activity: Not on file  Stress: Not on file  Social Connections: Not on file  Family History: The patient's family history includes Heart disease in his mother; Stroke in his father.  ROS:   Please see the history of present illness.     All other systems reviewed and are negative.  EKGs/Labs/Other Studies Reviewed:    The following studies were reviewed today:   EKG:  EKG is  ordered today.  The ekg ordered today demonstrates a sensed V paced rhythm  Recent Labs: 11/11/2020: TSH 6.895 11/14/2020: Magnesium 2.0 11/27/2020: B Natriuretic Peptide 573.4 01/30/2021: BUN 20; Creatinine, Ser 1.27; Hemoglobin 13.2; Platelets 327; Potassium 4.2; Sodium 140  Recent Lipid Panel No results found for: CHOL, TRIG, HDL, CHOLHDL, VLDL, LDLCALC, LDLDIRECT   Risk Assessment/Calculations:         Physical Exam:     VS:  BP (!) 146/104 (BP Location: Left Arm, Patient Position: Sitting, Cuff Size: Normal)   Pulse 76   Ht 5\' 9"  (1.753 m)   Wt 226 lb (102.5 kg)   SpO2 98%   BMI 33.37 kg/m     Wt Readings from Last 3 Encounters:  06/04/21 226 lb (102.5 kg)  05/13/21 226 lb (102.5 kg)  04/02/21 227 lb (103 kg)     GEN:  Well nourished, well developed in no acute distress HEENT: Normal NECK: No JVD; No carotid bruits CARDIAC: RRR, no murmurs, rubs, gallops RESPIRATORY:  Clear to auscultation without rales, wheezing or rhonchi  ABDOMEN: Soft, non-tender, non-distended MUSCULOSKELETAL:  No edema; No deformity  SKIN: Warm and dry NEUROLOGIC:  Alert and oriented x 3 PSYCHIATRIC:  Normal affect   ASSESSMENT:    1. Fatigue, unspecified type   2. Complete heart block (HCC)   3. Primary hypertension     PLAN:    In order of problems listed above:  Patient complains of fatigue with minimal activity, last echo with preserved ejection fraction.  Symptoms likely from deconditioning.  Graduated exercising encouraged. Complete heart block s/p PPM 01/2021 .  EKG shows a sensed V paced rhythm, pacemaker appears to be functioning normally.  Follows up with device clinic. Hypertension, BP elevated.  Increase hydralazine to 50 mg 3 times daily, continue lisinopril 40 mg daily, amlodipine 10 mg daily.  Continue to take Lasix as needed  Follow-up in 3 months.     Medication Adjustments/Labs and Tests Ordered: Current medicines are reviewed at length with the patient today.  Concerns regarding medicines are outlined above.  Orders Placed This Encounter  Procedures   EKG 12-Lead    Meds ordered this encounter  Medications   hydrALAZINE (APRESOLINE) 50 MG tablet    Sig: Take 1 tablet (50 mg total) by mouth 3 (three) times daily.    Dispense:  90 tablet    Refill:  5   furosemide (LASIX) 20 MG tablet    Sig: Take 1 tablet (20 mg total) by mouth as needed.    Dispense:  30 tablet    Refill:  3     No need to refill at this time. Dose changed to prn.     Patient Instructions  Medication Instructions:   Your physician has recommended you make the following change in your medication:    INCREASE your Hydralazine to 50 MG THREE times a day.  2.    ONLY take your Furosemide (Lasix) 20 MG AS NEEDED for swelling or shortness of breath.  *If you need a refill on your cardiac medications before your next appointment, please call your pharmacy*   Lab Work:  None ordered  If  you have labs (blood work) drawn today and your tests are completely normal, you will receive your results only by: MyChart Message (if you have MyChart) OR A paper copy in the mail If you have any lab test that is abnormal or we need to change your treatment, we will call you to review the results.   Testing/Procedures:  None ordered   Follow-Up: At [facility], you and your health needs are our priority.  As part of our continuing mission to provide you with exceptional heart care, we have created designated Provider Care Teams.  These Care Teams include your primary Cardiologist (physician) and Advanced Practice Providers (APPs -  Physician Assistants and Nurse Practitioners) who all work together to provide you with the care you need, when you need it.  We recommend signing up for the patient portal called "MyChart".  Sign up information is provided on this After Visit Summary.  MyChart is used to connect with patients for Virtual Visits (Telemedicine).  Patients are able to view lab/test results, encounter notes, upcoming appointments, etc.  Non-urgent messages can be sent to your provider as well.   To learn more about what you can do with MyChart, go to [email or web address].    Your next appointment:   3 month(s)  The format for your next appointment:   In Person  Provider:   Debbe Odea, MD    Other Instructions   Important Information About Sugar         Signed, Debbe Odea, MD  06/04/2021 1:23 PM    [facility]

## 2021-06-04 NOTE — Patient Instructions (Signed)
Medication Instructions:   Your physician has recommended you make the following change in your medication:    INCREASE your Hydralazine to 50 MG THREE times a day.  2.    ONLY take your Furosemide (Lasix) 20 MG AS NEEDED for swelling or shortness of breath.  *If you need a refill on your cardiac medications before your next appointment, please call your pharmacy*   Lab Work:  None ordered  If you have labs (blood work) drawn today and your tests are completely normal, you will receive your results only by: MyChart Message (if you have MyChart) OR A paper copy in the mail If you have any lab test that is abnormal or we need to change your treatment, we will call you to review the results.   Testing/Procedures:  None ordered   Follow-Up: At [facility], you and your health needs are our priority.  As part of our continuing mission to provide you with exceptional heart care, we have created designated Provider Care Teams.  These Care Teams include your primary Cardiologist (physician) and Advanced Practice Providers (APPs -  Physician Assistants and Nurse Practitioners) who all work together to provide you with the care you need, when you need it.  We recommend signing up for the patient portal called "MyChart".  Sign up information is provided on this After Visit Summary.  MyChart is used to connect with patients for Virtual Visits (Telemedicine).  Patients are able to view lab/test results, encounter notes, upcoming appointments, etc.  Non-urgent messages can be sent to your provider as well.   To learn more about what you can do with MyChart, go to [email or web address].    Your next appointment:   3 month(s)  The format for your next appointment:   In Person  Provider:   Debbe Odea, MD    Other Instructions   Important Information About Sugar

## 2021-08-11 ENCOUNTER — Ambulatory Visit (INDEPENDENT_AMBULATORY_CARE_PROVIDER_SITE_OTHER): Payer: Medicare (Managed Care)

## 2021-08-11 DIAGNOSIS — I442 Atrioventricular block, complete: Secondary | ICD-10-CM

## 2021-08-11 LAB — CUP PACEART REMOTE DEVICE CHECK
Date Time Interrogation Session: 20230801134113
Implantable Lead Implant Date: 20230130
Implantable Lead Implant Date: 20230130
Implantable Lead Location: 753859
Implantable Lead Location: 753860
Implantable Lead Model: 377171
Implantable Lead Model: 377171
Implantable Lead Serial Number: 7000391305
Implantable Lead Serial Number: 8000666643
Implantable Pulse Generator Implant Date: 20230130
Pulse Gen Model: 407145
Pulse Gen Serial Number: 70278108

## 2021-09-04 ENCOUNTER — Encounter: Payer: Self-pay | Admitting: Cardiology

## 2021-09-04 ENCOUNTER — Ambulatory Visit (INDEPENDENT_AMBULATORY_CARE_PROVIDER_SITE_OTHER): Payer: Medicare (Managed Care) | Admitting: Cardiology

## 2021-09-04 VITALS — BP 134/78 | HR 88 | Ht 69.0 in | Wt 226.2 lb

## 2021-09-04 DIAGNOSIS — I442 Atrioventricular block, complete: Secondary | ICD-10-CM | POA: Diagnosis not present

## 2021-09-04 DIAGNOSIS — R5383 Other fatigue: Secondary | ICD-10-CM

## 2021-09-04 DIAGNOSIS — I10 Essential (primary) hypertension: Secondary | ICD-10-CM | POA: Diagnosis not present

## 2021-09-04 NOTE — Patient Instructions (Signed)
Medication Instructions:   Your physician recommends that you continue on your current medications as directed. Please refer to the Current Medication list given to you today.   *If you need a refill on your cardiac medications before your next appointment, please call your pharmacy*  Follow-Up: At [facility], you and your health needs are our priority.  As part of our continuing mission to provide you with exceptional heart care, we have created designated Provider Care Teams.  These Care Teams include your primary Cardiologist (physician) and Advanced Practice Providers (APPs -  Physician Assistants and Nurse Practitioners) who all work together to provide you with the care you need, when you need it.  We recommend signing up for the patient portal called "MyChart".  Sign up information is provided on this After Visit Summary.  MyChart is used to connect with patients for Virtual Visits (Telemedicine).  Patients are able to view lab/test results, encounter notes, upcoming appointments, etc.  Non-urgent messages can be sent to your provider as well.   To learn more about what you can do with MyChart, go to [email or web address].    Your next appointment:   1 year(s)  The format for your next appointment:   In Person  Provider:   Brian Agbor-Etang, MD    Other Instructions   Important Information About Sugar       

## 2021-09-04 NOTE — Progress Notes (Signed)
Cardiology Office Note:    Date:  09/04/2021   ID:  [name], DOB [birth year], MRN [id number]  PCP:  Center, [facility] Providers Cardiologist:  Debbe Odea, MD Electrophysiologist:  Lanier Prude, MD     Referring MD: Center, [facility]*   Chief Complaint  Patient presents with   Other    1 month f/u no complaints today. Meds reviewed verbally with pt.    History of Present Illness:    [name] is a 66 y.o. male with a hx of hypertension, hyperlipidemia, diabetes, CHB (s/p PPM Biotronik 01/2021) who presents for follow-up.  Previously seen for dyspnea on exertion, hypertension.  Hydralazine increased to 50 mg 3 times daily, graduated exercising recommended.  He states feeling much better with increased activity levels.  Denies shortness of breath.  Blood pressure is also better controlled.  Feels well, has no concerns at this time.  Prior notes Echocardiogram 11/2020 normal systolic and diastolic function, mild LVH, EF 65 to 70% Cardiac monitor 11/2020 episodes of Mobitz 2 and third-degree AV block present  Past Medical History:  Diagnosis Date   Dermatophytosis of foot    Diabetes mellitus without complication (HCC)    Hypertension    Hypokalemia    Sebaceous cyst    Vitamin D deficiency     Past Surgical History:  Procedure Laterality Date   APPENDECTOMY     PACEMAKER IMPLANT N/A 02/09/2021   Procedure: PACEMAKER IMPLANT;  Surgeon: Lanier Prude, MD;  [facility]: [facility];  Service: Cardiovascular;  Laterality: N/A;    Current Medications: Current Meds  Medication Sig   amLODipine (NORVASC) 10 MG tablet Take 10 mg by mouth daily.   aspirin EC 81 MG EC tablet Take 1 tablet (81 mg total) by mouth daily. Swallow whole.   atorvastatin (LIPITOR) 40 MG tablet Take 40 mg by mouth daily.   furosemide (LASIX) 20 MG tablet Take 1 tablet (20 mg total) by mouth as needed.   glipiZIDE (GLUCOTROL) 10 MG  tablet Take 10 mg by mouth 2 (two) times daily before a meal.   hydrALAZINE (APRESOLINE) 50 MG tablet Take 1 tablet (50 mg total) by mouth 3 (three) times daily.   liraglutide (VICTOZA) 18 MG/3ML SOPN Inject 1.8 mg into the skin daily.   lisinopril (ZESTRIL) 40 MG tablet Take 1 tablet (40 mg total) by mouth daily.   metFORMIN (GLUCOPHAGE) 1000 MG tablet Take 1,000 mg by mouth 2 (two) times daily.   pantoprazole (PROTONIX) 40 MG tablet Take 1 tablet (40 mg total) by mouth daily.     Allergies:   Patient has no known allergies.   Social History   Socioeconomic History   Marital status: Single    Spouse name: Not on file   Number of children: Not on file   Years of education: Not on file   Highest education level: Not on file  Occupational History   Not on file  Tobacco Use   Smoking status: Former   Smokeless tobacco: Never  Substance and Sexual Activity   Alcohol use: Yes    Comment: occ.   Drug use: Never   Sexual activity: Not on file  Other Topics Concern   Not on file  Social History Narrative   Not on file   Social Determinants of Health   Financial Resource Strain: Not on file  Food Insecurity: Not on file  Transportation Needs: Not on file  Physical Activity: Not  on file  Stress: Not on file  Social Connections: Not on file     Family History: The patient's family history includes Heart disease in his mother; Stroke in his father.  ROS:   Please see the history of present illness.     All other systems reviewed and are negative.  EKGs/Labs/Other Studies Reviewed:    The following studies were reviewed today:   EKG:  EKG not ordered today.   Recent Labs: 11/11/2020: TSH 6.895 11/14/2020: Magnesium 2.0 11/27/2020: B Natriuretic Peptide 573.4 01/30/2021: BUN 20; Creatinine, Ser 1.27; Hemoglobin 13.2; Platelets 327; Potassium 4.2; Sodium 140  Recent Lipid Panel No results found for: "CHOL", "TRIG", "HDL", "CHOLHDL", "VLDL", "LDLCALC",  "LDLDIRECT"   Risk Assessment/Calculations:         Physical Exam:    VS:  BP 134/78 (BP Location: Left Arm, Patient Position: Sitting, Cuff Size: Normal)   Pulse 88   Ht 5\' 9"  (1.753 m)   Wt 226 lb 4 oz (102.6 kg)   SpO2 98%   BMI 33.41 kg/m     Wt Readings from Last 3 Encounters:  09/04/21 226 lb 4 oz (102.6 kg)  06/04/21 226 lb (102.5 kg)  05/13/21 226 lb (102.5 kg)     GEN:  Well nourished, well developed in no acute distress HEENT: Normal NECK: No JVD; No carotid bruits CARDIAC: RRR, no murmurs, rubs, gallops RESPIRATORY:  Clear to auscultation without rales, wheezing or rhonchi  ABDOMEN: Soft, non-tender, non-distended MUSCULOSKELETAL:  No edema; No deformity  SKIN: Warm and dry NEUROLOGIC:  Alert and oriented x 3 PSYCHIATRIC:  Normal affect   ASSESSMENT:    1. Fatigue, unspecified type   2. Complete heart block (HCC)   3. Primary hypertension    PLAN:    In order of problems listed above:  Fatigue, symptoms improved with increased activity.  Likely from deconditioning.  Continued graduated exercising advised. Complete heart block s/p PPM 01/2021 .  Keep appointment with device clinic for frequent checks. Hypertension, BP now controlled..  Continue hydralazine to 50 mg 3 times daily, lisinopril 40 mg daily, amlodipine 10 mg daily.    Follow-up in 1 year.     Medication Adjustments/Labs and Tests Ordered: Current medicines are reviewed at length with the patient today.  Concerns regarding medicines are outlined above.  No orders of the defined types were placed in this encounter.   No orders of the defined types were placed in this encounter.    Patient Instructions  Medication Instructions:   Your physician recommends that you continue on your current medications as directed. Please refer to the Current Medication list given to you today.  *If you need a refill on your cardiac medications before your next appointment, please call your  pharmacy*    Follow-Up: At [facility], you and your health needs are our priority.  As part of our continuing mission to provide you with exceptional heart care, we have created designated Provider Care Teams.  These Care Teams include your primary Cardiologist (physician) and Advanced Practice Providers (APPs -  Physician Assistants and Nurse Practitioners) who all work together to provide you with the care you need, when you need it.  We recommend signing up for the patient portal called "MyChart".  Sign up information is provided on this After Visit Summary.  MyChart is used to connect with patients for Virtual Visits (Telemedicine).  Patients are able to view lab/test results, encounter notes, upcoming appointments, etc.  Non-urgent messages can be sent  to your provider as well.   To learn more about what you can do with MyChart, go to [email or web address].    Your next appointment:   1 year(s)  The format for your next appointment:   In Person  Provider:   Debbe Odea, MD    Other Instructions   Important Information About Sugar         Signed, Debbe Odea, MD  09/04/2021 12:02 PM    [facility]

## 2021-09-08 NOTE — Progress Notes (Signed)
Remote pacemaker transmission.   

## 2021-09-18 ENCOUNTER — Other Ambulatory Visit (INDEPENDENT_AMBULATORY_CARE_PROVIDER_SITE_OTHER): Payer: Self-pay | Admitting: Nurse Practitioner

## 2021-09-18 DIAGNOSIS — I739 Peripheral vascular disease, unspecified: Secondary | ICD-10-CM

## 2021-09-21 ENCOUNTER — Ambulatory Visit (INDEPENDENT_AMBULATORY_CARE_PROVIDER_SITE_OTHER): Payer: Medicare (Managed Care) | Admitting: Nurse Practitioner

## 2021-09-21 ENCOUNTER — Encounter (INDEPENDENT_AMBULATORY_CARE_PROVIDER_SITE_OTHER): Payer: Self-pay | Admitting: Nurse Practitioner

## 2021-09-21 ENCOUNTER — Ambulatory Visit (INDEPENDENT_AMBULATORY_CARE_PROVIDER_SITE_OTHER): Payer: Medicare (Managed Care)

## 2021-09-21 VITALS — BP 118/79 | HR 92 | Resp 16 | Wt 225.0 lb

## 2021-09-21 DIAGNOSIS — R6889 Other general symptoms and signs: Secondary | ICD-10-CM

## 2021-09-21 DIAGNOSIS — I10 Essential (primary) hypertension: Secondary | ICD-10-CM

## 2021-09-21 DIAGNOSIS — E119 Type 2 diabetes mellitus without complications: Secondary | ICD-10-CM

## 2021-09-21 NOTE — Progress Notes (Signed)
Subjective:    Patient ID: [name], male    DOB: [birth year], 66 y.o.   MRN: [id number] Chief Complaint  Patient presents with   Follow-up    Ref [facility] consult for claudication and possible PAD with ABI     [name] is a 66 year old male that presents today as a referral due to concern for an abnormal ABI by home health.  The patient continues to deny claudication-like symptoms.  He also denies any rest pain.  He denies any open wounds or ulcerations.  Denies any signs symptoms that would be associated with peripheral arterial disease.  The patient was seen previously on 01/07/2021 for evaluation of his peripheral arterial disease for cold feet.  Shortly after this time the patient was diagnosed with a complete heart block.  The patient had a pacemaker placed.  The changed his medicines around and since that time his feet have been warm.    Today noninvasive studies show a right ABI of 1.13 and a left of 1.19.  The patient has a TBI of 1.1 on the right and a TBI of 1.07 on the left.  These are all within the normal range.  He also has triphasic tibial artery waveforms bilaterally with normal toe waveforms bilaterally.  The patient's previous study showed an ABI of 1.14 on the right and 1.03 on the left.  These are normal as well.      Review of Systems  All other systems reviewed and are negative.      Objective:   Physical Exam Vitals reviewed.  HENT:     Head: Normocephalic.  Cardiovascular:     Rate and Rhythm: Normal rate.     Pulses: Normal pulses.  Pulmonary:     Effort: Pulmonary effort is normal.  Skin:    General: Skin is warm and dry.  Neurological:     Mental Status: He is alert and oriented to person, place, and time.  Psychiatric:        Mood and Affect: Mood normal.        Behavior: Behavior normal.        Thought Content: Thought content normal.        Judgment: Judgment normal.     BP 118/79 (BP Location: Right Arm)   Pulse 92   Resp 16    Wt 225 lb (102.1 kg)   BMI 33.23 kg/m   Past Medical History:  Diagnosis Date   Dermatophytosis of foot    Diabetes mellitus without complication (HCC)    Hypertension    Hypokalemia    Sebaceous cyst    Vitamin D deficiency     Social History   Socioeconomic History   Marital status: Single    Spouse name: Not on file   Number of children: Not on file   Years of education: Not on file   Highest education level: Not on file  Occupational History   Not on file  Tobacco Use   Smoking status: Former   Smokeless tobacco: Never  Substance and Sexual Activity   Alcohol use: Yes    Comment: occ.   Drug use: Never   Sexual activity: Not on file  Other Topics Concern   Not on file  Social History Narrative   Not on file   Social Determinants of Health   Financial Resource Strain: Not on file  Food Insecurity: Not on file  Transportation Needs: Not on file  Physical Activity: Not on file  Stress:  Not on file  Social Connections: Not on file  Intimate Partner Violence: Not on file    Past Surgical History:  Procedure Laterality Date   APPENDECTOMY     PACEMAKER IMPLANT N/A 02/09/2021   Procedure: PACEMAKER IMPLANT;  Surgeon: Lanier Prude, MD;  Location: [facility] INVASIVE CV LAB;  Service: Cardiovascular;  Laterality: N/A;    Family History  Problem Relation Age of Onset   Heart disease Mother    Stroke Father     No Known Allergies     Latest Ref Rng & Units 01/30/2021    8:33 AM 11/27/2020   11:08 AM 11/14/2020    4:55 AM  CBC  WBC 3.4 - 10.8 x10E3/uL 10.6  14.6  10.3   Hemoglobin 13.0 - 17.7 g/dL 94.1  74.0  81.4   Hematocrit 37.5 - 51.0 % 38.9  39.6  32.7   Platelets 150 - 450 x10E3/uL 327  354  222       CMP     Component Value Date/Time   NA 140 01/30/2021 0833   K 4.2 01/30/2021 0833   CL 102 01/30/2021 0833   CO2 28 01/30/2021 0833   GLUCOSE 215 (H) 01/30/2021 0833   GLUCOSE 192 (H) 11/27/2020 1108   BUN 20 01/30/2021 0833   CREATININE  1.27 01/30/2021 0833   CALCIUM 9.4 01/30/2021 0833   GFRNONAA 48 (L) 11/27/2020 1108   GFRAA >60 03/26/2015 0041     No results found.     Assessment & Plan:   1. Abnormal ankle brachial index (ABI) This study is consistent with the study done on 01/07/2021.  I had a discussion with the patient that frequently home health screening studies for ABIs are predominantly false positives.  The patient has no evidence of peripheral arterial disease.  He has no symptoms including claudication, rest pain or ulceration.  Patient is advised to continue to be active.  No role for vascular intervention currently.  Patient will follow-up on an as-needed basis. - VAS [place] ABI WITH/WO TBI  2. Type 2 diabetes mellitus without complication, without long-term current use of insulin (HCC) Continue hypoglycemic medications as already ordered, these medications have been reviewed and there are no changes at this time.  Hgb A1C to be monitored as already arranged by primary service   3. HTN (hypertension), benign Continue antihypertensive medications as already ordered, these medications have been reviewed and there are no changes at this time.    Current Outpatient Medications on File Prior to Visit  Medication Sig Dispense Refill   amLODipine (NORVASC) 10 MG tablet Take 10 mg by mouth daily.     aspirin EC 81 MG EC tablet Take 1 tablet (81 mg total) by mouth daily. Swallow whole. 30 tablet 11   atorvastatin (LIPITOR) 40 MG tablet Take 40 mg by mouth daily.     furosemide (LASIX) 20 MG tablet Take 1 tablet (20 mg total) by mouth as needed. 30 tablet 3   glipiZIDE (GLUCOTROL) 10 MG tablet Take 10 mg by mouth 2 (two) times daily before a meal.     hydrALAZINE (APRESOLINE) 50 MG tablet Take 1 tablet (50 mg total) by mouth 3 (three) times daily. 90 tablet 5   liraglutide (VICTOZA) 18 MG/3ML SOPN Inject 1.8 mg into the skin daily.     lisinopril (ZESTRIL) 40 MG tablet Take 1 tablet (40 mg total) by mouth daily.  30 tablet 5   metFORMIN (GLUCOPHAGE) 1000 MG tablet Take 1,000 mg by mouth  2 (two) times daily.     pantoprazole (PROTONIX) 40 MG tablet Take 1 tablet (40 mg total) by mouth daily. 30 tablet 5   No current facility-administered medications on file prior to visit.    There are no Patient Instructions on file for this visit. No follow-ups on file.   Georgiana Spinner, NP

## 2021-09-24 ENCOUNTER — Other Ambulatory Visit: Payer: Self-pay

## 2021-09-24 MED ORDER — LISINOPRIL 40 MG PO TABS: 40.0000 mg | ORAL_TABLET | Freq: Every day | ORAL | 9 refills | Status: DC

## 2021-11-10 ENCOUNTER — Ambulatory Visit (INDEPENDENT_AMBULATORY_CARE_PROVIDER_SITE_OTHER): Payer: Medicare (Managed Care)

## 2021-11-10 DIAGNOSIS — I442 Atrioventricular block, complete: Secondary | ICD-10-CM | POA: Diagnosis not present

## 2021-11-10 LAB — CUP PACEART REMOTE DEVICE CHECK
Date Time Interrogation Session: 20231031064120
Implantable Lead Connection Status: 753985
Implantable Lead Connection Status: 753985
Implantable Lead Implant Date: 20230130
Implantable Lead Implant Date: 20230130
Implantable Lead Location: 753859
Implantable Lead Location: 753860
Implantable Lead Model: 377171
Implantable Lead Model: 377171
Implantable Lead Serial Number: 7000391305
Implantable Lead Serial Number: 8000666643
Implantable Pulse Generator Implant Date: 20230130
Pulse Gen Model: 407145
Pulse Gen Serial Number: 70278108

## 2021-11-24 NOTE — Progress Notes (Signed)
Remote pacemaker transmission.   

## 2022-02-09 ENCOUNTER — Ambulatory Visit: Payer: Medicare (Managed Care)

## 2022-02-09 DIAGNOSIS — I442 Atrioventricular block, complete: Secondary | ICD-10-CM | POA: Diagnosis not present

## 2022-02-09 LAB — CUP PACEART REMOTE DEVICE CHECK
Date Time Interrogation Session: 20240130074952
Implantable Lead Connection Status: 753985
Implantable Lead Connection Status: 753985
Implantable Lead Implant Date: 20230130
Implantable Lead Implant Date: 20230130
Implantable Lead Location: 753859
Implantable Lead Location: 753860
Implantable Lead Model: 377171
Implantable Lead Model: 377171
Implantable Lead Serial Number: 7000391305
Implantable Lead Serial Number: 8000666643
Implantable Pulse Generator Implant Date: 20230130
Pulse Gen Model: 407145
Pulse Gen Serial Number: 70278108

## 2022-03-05 NOTE — Progress Notes (Signed)
Remote pacemaker transmission.   

## 2022-04-22 ENCOUNTER — Encounter: Payer: Self-pay | Admitting: *Deleted

## 2022-04-22 NOTE — Progress Notes (Signed)
Cardiology Office Note Date:  04/23/2022  Patient ID:  [name], DOB 07/11/1955, MRN [id number] PCP:  [facility], [name]  Cardiologist:  Brian OdeaBrian Agbor-Etang, MD Electrophysiologist: Brian PrudeAMERON T [name], MD    Chief Complaint: 1 year PPM follow-up  History of Present Illness: [name] is a 67 y.o. male with PMH notable for CHB s/p PPM, HTN, T2DM, PVD; seen today for Brian PrudeAMERON T [name], MD for routine electrophysiology followup.  Last saw Dr. Lalla BrothersLambert 05/2021 for 91d post-implant appointment Has followed regularly with Dr. Azucena CecilAgbor-Etang for BP mgmt.  He over all feels very well today. Remains active. Has some decreased stamina with activity, like with walking up steps. Able to grocery shop and vacuum without difficulty.  His BP readings at home are 140-160s. He frequently misses the afternoon dose of hydral, but otherwise is very good at taking BID meds.     he denies chest pain, palpitations, dyspnea, PND, orthopnea, nausea, vomiting, dizziness, syncope, edema, weight gain, or early satiety.     Device Information: Bio dual chamber PPM, imp 01/30/2021; dx CHB RV lead in septum  Past Medical History:  Diagnosis Date   Dermatophytosis of foot    Diabetes mellitus without complication    Hypertension    Hypokalemia    Sebaceous cyst    Vitamin D deficiency     Past Surgical History:  Procedure Laterality Date   APPENDECTOMY     PACEMAKER IMPLANT N/A 02/09/2021   Procedure: PACEMAKER IMPLANT;  Surgeon: Brian PrudeLambert, Brian T, MD;  Location: [facility];  Service: Cardiovascular;  Laterality: N/A;    Current Outpatient Medications  Medication Instructions   amLODipine (NORVASC) 10 mg, Oral, Daily   aspirin EC 81 mg, Oral, Daily, Swallow whole.   atorvastatin (LIPITOR) 40 mg, Oral, Daily   furosemide (LASIX) 20 mg, Oral, As needed   glipiZIDE (GLUCOTROL) 10 mg, Oral, 2 times daily before meals   hydrALAZINE (APRESOLINE) 100 mg, Oral, 2 times daily    liraglutide (VICTOZA) 1.8 mg, Subcutaneous, Daily   lisinopril (ZESTRIL) 40 mg, Oral, Daily   metFORMIN (GLUCOPHAGE) 1,000 mg, Oral, 2 times daily   pantoprazole (PROTONIX) 40 mg, Oral, Daily    Social History:  The patient  reports that he has quit smoking. He has never used smokeless tobacco. He reports current alcohol use. He reports that he does not use drugs.   Family History:  The patient's family history includes Heart disease in his mother; Stroke in his father.  ROS:  Please see the history of present illness. All other systems are reviewed and otherwise negative.   PHYSICAL EXAM:   Vitals:   04/23/22 1010 04/23/22 1053  BP: (!) 158/90 (!) 160/90 Comment: repeat BP @ 1053  Pulse: 68   Height: 5\' 9"  (1.753 m)   Weight: 230 lb 12.8 oz (104.7 kg)   SpO2: 98%   BMI (Calculated): 34.07     GEN- The patient is well appearing, alert and oriented x 3 today.   Lungs- Clear to ausculation bilaterally, normal work of breathing.  Heart- Regular rate and rhythm, no murmurs, rubs or gallops Extremities- No peripheral edema, warm, dry Skin-  device pocket well-healed   Device interrogation done today and reviewed by myself:  Battery good Lead thresholds, impedence, sensing stable Brief NSVT episode, 13 beats Adjusted threshold from implant settings to : A - 2 at 0.4 V - 2.6 at 0.4    EKG is ordered. Personal review of EKG from today shows:  NSR, rate 68  Recent Labs: No results found for requested labs within last 365 days.  No results found for requested labs within last 365 days.   CrCl cannot be calculated (Patient's most recent lab result is older than the maximum 21 days allowed.).   Wt Readings from Last 3 Encounters:  04/23/22 230 lb 12.8 oz (104.7 kg)  09/21/21 225 lb (102.1 kg)  09/04/21 226 lb 4 oz (102.6 kg)     Additional studies reviewed include: Previous EP, cardiology notes.   TTE, 03/05/2021 1. Left ventricular ejection fraction, by estimation, is 60  to 65%. The left ventricle has normal function. The left ventricle has no regional wall motion abnormalities. There is mild left ventricular hypertrophy. Left ventricular diastolic parameters are indeterminate.   2. Right ventricular systolic function is normal. The right ventricular size is normal.   3. Left atrial size was moderately dilated.   4. The mitral valve is normal in structure. Mild mitral valve  regurgitation.   5. The aortic valve is tricuspid. Aortic valve regurgitation is not visualized.    ASSESSMENT AND PLAN:  #) CHB s/p Biotronik PPM Device functioning well, see paceart for details Much less dizziness, fatigue than pre-device  #) HTN Elevated in office today and on recheck He is frequently missing afternoon hydral doses Will adjust hydral to 100mg  BID Cont amlodipine, lisinopril, lasix He has appt soon with PCP, will follow labs  #) NSVT One brief episode by device No symptoms during episode No further med changes, cont to monitor via device   Current medicines are reviewed at length with the patient today.   The patient has concerns regarding his medicines.  The following changes were made today:   INCREASE hydralazine to 100mg  BID  Labs/ tests ordered today include:  Orders Placed This Encounter  Procedures   EKG 12-Lead     Disposition: Follow up with Dr. Lalla [name] or EP APP in in 12 months   Signed, Brian Don, NP  04/23/22  12:13 PM  Electrophysiology [facility]

## 2022-04-23 ENCOUNTER — Ambulatory Visit: Payer: Medicare HMO | Attending: Cardiology | Admitting: Cardiology

## 2022-04-23 ENCOUNTER — Encounter: Payer: Self-pay | Admitting: Cardiology

## 2022-04-23 VITALS — BP 160/90 | HR 68 | Ht 69.0 in | Wt 230.8 lb

## 2022-04-23 DIAGNOSIS — I4729 Other ventricular tachycardia: Secondary | ICD-10-CM

## 2022-04-23 DIAGNOSIS — I442 Atrioventricular block, complete: Secondary | ICD-10-CM | POA: Diagnosis not present

## 2022-04-23 DIAGNOSIS — I10 Essential (primary) hypertension: Secondary | ICD-10-CM

## 2022-04-23 DIAGNOSIS — Z950 Presence of cardiac pacemaker: Secondary | ICD-10-CM

## 2022-04-23 LAB — CUP PACEART INCLINIC DEVICE CHECK
Date Time Interrogation Session: 20240412152159
Implantable Lead Connection Status: 753985
Implantable Lead Connection Status: 753985
Implantable Lead Implant Date: 20230130
Implantable Lead Implant Date: 20230130
Implantable Lead Location: 753859
Implantable Lead Location: 753860
Implantable Lead Model: 377171
Implantable Lead Model: 377171
Implantable Lead Serial Number: 7000391305
Implantable Lead Serial Number: 8000666643
Implantable Pulse Generator Implant Date: 20230130
Pulse Gen Model: 407145
Pulse Gen Serial Number: 70278108

## 2022-04-23 MED ORDER — HYDRALAZINE HCL 100 MG PO TABS: 100.0000 mg | ORAL_TABLET | Freq: Two times a day (BID) | ORAL | 2 refills | Status: DC

## 2022-04-23 NOTE — Patient Instructions (Signed)
Medication Instructions:  INCREASE hydralazine to 100 mg by mouth twice a day  *If you need a refill on your cardiac medications before your next appointment, please call your pharmacy*  Lab Work: No labs ordered  If you have labs (blood work) drawn today and your tests are completely normal, you will receive your results only by: MyChart Message (if you have MyChart) OR A paper copy in the mail If you have any lab test that is abnormal or we need to change your treatment, we will call you to review the results.  Testing/Procedures: No testing ordered  Follow-Up: At [facility], you and your health needs are our priority.  As part of our continuing mission to provide you with exceptional heart care, we have created designated Provider Care Teams.  These Care Teams include your primary Cardiologist (physician) and Advanced Practice Providers (APPs -  Physician Assistants and Nurse Practitioners) who all work together to provide you with the care you need, when you need it.  We recommend signing up for the patient portal called "MyChart".  Sign up information is provided on this After Visit Summary.  MyChart is used to connect with patients for Virtual Visits (Telemedicine).  Patients are able to view lab/test results, encounter notes, upcoming appointments, etc.  Non-urgent messages can be sent to your provider as well.   To learn more about what you can do with MyChart, go to [email or web address].    Your next appointment:   1 year(s)  Provider:   Steffanie Dunn, MD or Sherie Don, NP

## 2022-04-26 ENCOUNTER — Encounter: Payer: Self-pay | Admitting: Cardiology

## 2022-05-04 ENCOUNTER — Other Ambulatory Visit: Payer: Self-pay

## 2022-05-04 MED ORDER — FUROSEMIDE 20 MG PO TABS: 20.0000 mg | ORAL_TABLET | ORAL | 1 refills | Status: DC | PRN

## 2022-05-11 ENCOUNTER — Ambulatory Visit (INDEPENDENT_AMBULATORY_CARE_PROVIDER_SITE_OTHER): Payer: Medicare HMO

## 2022-05-11 DIAGNOSIS — I442 Atrioventricular block, complete: Secondary | ICD-10-CM

## 2022-05-11 LAB — CUP PACEART REMOTE DEVICE CHECK
Battery Voltage: 90
Date Time Interrogation Session: 20240430072232
Implantable Lead Connection Status: 753985
Implantable Lead Connection Status: 753985
Implantable Lead Implant Date: 20230130
Implantable Lead Implant Date: 20230130
Implantable Lead Location: 753859
Implantable Lead Location: 753860
Implantable Lead Model: 377171
Implantable Lead Model: 377171
Implantable Lead Serial Number: 7000391305
Implantable Lead Serial Number: 8000666643
Implantable Pulse Generator Implant Date: 20230130
Pulse Gen Model: 407145
Pulse Gen Serial Number: 70278108

## 2022-05-12 ENCOUNTER — Encounter: Payer: Self-pay | Admitting: Cardiology

## 2022-05-20 ENCOUNTER — Encounter: Payer: Self-pay | Admitting: Emergency Medicine

## 2022-05-20 DIAGNOSIS — R739 Hyperglycemia, unspecified: Secondary | ICD-10-CM | POA: Diagnosis present

## 2022-05-20 DIAGNOSIS — I10 Essential (primary) hypertension: Secondary | ICD-10-CM | POA: Diagnosis not present

## 2022-05-20 DIAGNOSIS — Z7984 Long term (current) use of oral hypoglycemic drugs: Secondary | ICD-10-CM | POA: Diagnosis not present

## 2022-05-20 DIAGNOSIS — Z950 Presence of cardiac pacemaker: Secondary | ICD-10-CM | POA: Insufficient documentation

## 2022-05-20 DIAGNOSIS — E1165 Type 2 diabetes mellitus with hyperglycemia: Secondary | ICD-10-CM | POA: Insufficient documentation

## 2022-05-20 LAB — BETA-HYDROXYBUTYRIC ACID: Beta-Hydroxybutyric Acid: 0.14 mmol/L (ref 0.05–0.27)

## 2022-05-20 LAB — COMPREHENSIVE METABOLIC PANEL
ALT: 21 U/L (ref 0–44)
AST: 24 U/L (ref 15–41)
Albumin: 3.7 g/dL (ref 3.5–5.0)
Alkaline Phosphatase: 168 U/L — ABNORMAL HIGH (ref 38–126)
Anion gap: 12 (ref 5–15)
BUN: 41 mg/dL — ABNORMAL HIGH (ref 8–23)
CO2: 22 mmol/L (ref 22–32)
Calcium: 8.9 mg/dL (ref 8.9–10.3)
Chloride: 94 mmol/L — ABNORMAL LOW (ref 98–111)
Creatinine, Ser: 2.04 mg/dL — ABNORMAL HIGH (ref 0.61–1.24)
GFR, Estimated: 35 mL/min — ABNORMAL LOW (ref >60)
Glucose, Bld: 677 mg/dL (ref 70–99)
Potassium: 4.3 mmol/L (ref 3.5–5.1)
Sodium: 128 mmol/L — ABNORMAL LOW (ref 135–145)
Total Bilirubin: 0.7 mg/dL (ref 0.3–1.2)
Total Protein: 7.1 g/dL (ref 6.5–8.1)

## 2022-05-20 LAB — CBC WITH DIFFERENTIAL/PLATELET
Abs Immature Granulocytes: 0.03 10*3/uL (ref 0.00–0.07)
Basophils Absolute: 0.1 10*3/uL (ref 0.0–0.1)
Basophils Relative: 1 %
Eosinophils Absolute: 0.2 10*3/uL (ref 0.0–0.5)
Eosinophils Relative: 2 %
HCT: 38.2 % — ABNORMAL LOW (ref 39.0–52.0)
Hemoglobin: 12.8 g/dL — ABNORMAL LOW (ref 13.0–17.0)
Immature Granulocytes: 0 %
Lymphocytes Relative: 33 %
Lymphs Abs: 3.7 10*3/uL (ref 0.7–4.0)
MCH: 26.6 pg (ref 26.0–34.0)
MCHC: 33.5 g/dL (ref 30.0–36.0)
MCV: 79.3 fL — ABNORMAL LOW (ref 80.0–100.0)
Monocytes Absolute: 0.8 10*3/uL (ref 0.1–1.0)
Monocytes Relative: 7 %
Neutro Abs: 6.4 10*3/uL (ref 1.7–7.7)
Neutrophils Relative %: 57 %
Platelets: 327 10*3/uL (ref 150–400)
RBC: 4.82 MIL/uL (ref 4.22–5.81)
RDW: 14.6 % (ref 11.5–15.5)
WBC: 11.2 10*3/uL — ABNORMAL HIGH (ref 4.0–10.5)
nRBC: 0 % (ref 0.0–0.2)

## 2022-05-20 LAB — BLOOD GAS, VENOUS
Acid-Base Excess: 1 mmol/L (ref 0.0–2.0)
Bicarbonate: 26 mmol/L (ref 20.0–28.0)
O2 Saturation: 69.5 %
Patient temperature: 37
pCO2, Ven: 42 mmHg — ABNORMAL LOW (ref 44–60)
pH, Ven: 7.4 (ref 7.25–7.43)
pO2, Ven: 41 mmHg (ref 32–45)

## 2022-05-20 LAB — TROPONIN I (HIGH SENSITIVITY): Troponin I (High Sensitivity): 20 ng/L — ABNORMAL HIGH (ref <18)

## 2022-05-20 LAB — CBG MONITORING, ED
Glucose-Capillary: >600 mg/dL (ref 70–99)
Glucose-Capillary: 444 mg/dL — ABNORMAL HIGH (ref 70–99)

## 2022-05-20 MED ORDER — SODIUM CHLORIDE 0.9 % IV BOLUS
2000.0000 mL | Freq: Once | INTRAVENOUS | Status: AC
  Administered 2022-05-20: 2000 mL via INTRAVENOUS

## 2022-05-20 NOTE — ED Triage Notes (Signed)
Pt presents ambulatory to triage via POV with complaints of hyperglycemia. Pt states that he is T2DM and took his CBG at home which was >600; recently changed from victoza and was changed to Ozempic this week. A&Ox4 at this time. Denies CP or SOB.    CBG - reads high in triage.

## 2022-05-21 ENCOUNTER — Emergency Department
Admission: EM | Admit: 2022-05-21 | Discharge: 2022-05-21 | Disposition: A | Discharge status: Home / Self Care | Payer: Medicare HMO | Attending: Emergency Medicine | Admitting: Emergency Medicine

## 2022-05-21 DIAGNOSIS — R739 Hyperglycemia, unspecified: Secondary | ICD-10-CM

## 2022-05-21 LAB — CBG MONITORING, ED
Glucose-Capillary: 327 mg/dL — ABNORMAL HIGH (ref 70–99)
Glucose-Capillary: 261 mg/dL — ABNORMAL HIGH (ref 70–99)

## 2022-05-21 LAB — TROPONIN I (HIGH SENSITIVITY): Troponin I (High Sensitivity): 33 ng/L — ABNORMAL HIGH (ref <18)

## 2022-05-21 MED ORDER — INSULIN ASPART 100 UNIT/ML IJ SOLN: 8.0000 [IU] | Freq: Once | INTRAMUSCULAR | Status: DC

## 2022-05-21 MED ORDER — INSULIN ASPART 100 UNIT/ML IJ SOLN
6.0000 [IU] | Freq: Once | INTRAMUSCULAR | Status: AC
  Administered 2022-05-21: 6 [IU] via SUBCUTANEOUS
  Filled 2022-05-21: qty 1

## 2022-05-21 NOTE — ED Notes (Signed)
PT brought to ed rm 5H at this time, this RN now assuming care.

## 2022-05-21 NOTE — Discharge Instructions (Addendum)
You are seen in the emergency department and had an elevated glucose level in the 600s.  It is important that you take your diabetic medications as prescribed.  Call your primary care physician to schedule close follow-up appointment and make sure that your diabetic medications are working for you.  Check your glucose every 2 hours while you are awake and make sure that your glucose is not dropping too low or becoming elevated again.  Stay hydrated and drink plenty of fluids.  Return to the emergency department for any significant hyperglycemia.  Return to the emergency department if you develop any chest pain.

## 2022-05-21 NOTE — ED Notes (Signed)
ED Provider at bedside. 

## 2022-05-21 NOTE — ED Provider Notes (Addendum)
[facility] Provider Note    Event Date/Time   First MD Initiated Contact with Patient 05/21/22 [phone]     (approximate)   History   Hyperglycemia   HPI  [name] is a 67 y.o. male past medical history significant for type 2 diabetes, complete heart block with pacemaker, hypertension, who presents to the emergency department for elevated glucose.  States that he was feeling very fatigued today so he checked his glucose and it was reading above 600.  Came into the emergency department and had a glucose above 600.  States that he had a recent elevation of his A1c and had a change of his medication.  Continues to take metformin but is no longer on glipizide and was switched to Ozempic.  Realized today that he did not give the correct dose of Ozempic on Sunday and took the correct dose today.  Denies any recent fever or chills.  No cough or shortness of breath.  Denies any chest pain.  Denies any nausea or vomiting.  No abdominal pain.  No dysuria, urinary urgency or frequency.     Physical Exam   Triage Vital Signs: ED Triage Vitals  Enc Vitals Group     BP 05/20/22 2123 135/68     Pulse Rate 05/20/22 2123 (!) 48     Resp 05/20/22 2123 18     Temp 05/20/22 2123 98.3 F (36.8 C)     Temp Source 05/20/22 2123 Oral     SpO2 05/20/22 2123 100 %     Weight 05/20/22 2121 231 lb 0.7 oz (104.8 kg)     Height 05/20/22 2121 5\' 9"  (1.753 m)     Head Circumference --      Peak Flow --      Pain Score 05/20/22 2121 0     Pain Loc --      Pain Edu? --      Excl. in GC? --     Most recent vital signs: Vitals:   05/21/22 0034 05/21/22 0324  BP: (!) 153/91 (!) 149/84  Pulse: 85 91  Resp: 18 16  Temp: 98 F (36.7 C)   SpO2: 94% 96%    Physical Exam Constitutional:      Appearance: He is well-developed.  HENT:     Head: Atraumatic.  Eyes:     Conjunctiva/sclera: Conjunctivae normal.  Cardiovascular:     Rate and Rhythm: Regular rhythm.   Pulmonary:     Effort: No respiratory distress.  Abdominal:     Tenderness: There is no abdominal tenderness.  Musculoskeletal:     Cervical back: Normal range of motion.  Skin:    General: Skin is warm.  Neurological:     Mental Status: He is alert. Mental status is at baseline.     IMPRESSION / MDM / ASSESSMENT AND PLAN / ED COURSE  I reviewed the triage vital signs and the nursing notes.  Differential diagnosis including hyperglycemia, DKA, HHS, dehydration, electrolyte abnormality.  Patient had triage lab work done that showed an elevated troponin.  Patient with hyperglycemia but denies any chest pain or shortness of breath.  History of pacemaker placement.  EKG consistent with paced rhythm with PVCs.  Given 2 L of IV fluid  EKG  I, Corena Herter, the attending physician, personally viewed and interpreted this ECG.  Ventricularly paced rhythm.  PACs.  Negative Sgarbossa's criteria.  No significant ST changes when compared to prior.  LABS (all labs ordered are  listed, but only abnormal results are displayed) Labs interpreted as -    Labs Reviewed  CBC WITH DIFFERENTIAL/PLATELET - Abnormal; Notable for the following components:      Result Value   WBC 11.2 (*)    Hemoglobin 12.8 (*)    HCT 38.2 (*)    MCV 79.3 (*)    All other components within normal limits  COMPREHENSIVE METABOLIC PANEL - Abnormal; Notable for the following components:   Sodium 128 (*)    Chloride 94 (*)    Glucose, Bld 677 (*)    BUN 41 (*)    Creatinine, Ser 2.04 (*)    Alkaline Phosphatase 168 (*)    GFR, Estimated 35 (*)    All other components within normal limits  BLOOD GAS, VENOUS - Abnormal; Notable for the following components:   pCO2, Ven 42 (*)    All other components within normal limits  CBG MONITORING, ED - Abnormal; Notable for the following components:   Glucose-Capillary >600 (*)    All other components within normal limits  CBG MONITORING, ED - Abnormal; Notable for the  following components:   Glucose-Capillary 444 (*)    All other components within normal limits  CBG MONITORING, ED - Abnormal; Notable for the following components:   Glucose-Capillary 327 (*)    All other components within normal limits  CBG MONITORING, ED - Abnormal; Notable for the following components:   Glucose-Capillary 261 (*)    All other components within normal limits  TROPONIN I (HIGH SENSITIVITY) - Abnormal; Notable for the following components:   Troponin I (High Sensitivity) 20 (*)    All other components within normal limits  TROPONIN I (HIGH SENSITIVITY) - Abnormal; Notable for the following components:   Troponin I (High Sensitivity) 33 (*)    All other components within normal limits  BETA-HYDROXYBUTYRIC ACID     MDM    Hyperglycemia but does not meet criteria for DKA.  Glucose was elevated greater than 600.  Given 2 L of IV fluids  On reevaluation patient had improvement of his glucose to 300.  Given subcu insulin  On reevaluation repeat glucose downtrending to the 200s.  Continues to not have any chest pain.  Does not appear clinically dehydrated.  Patient did have 2 elevated troponins that remained stable.  Denies any chest pain or symptoms of ACS.  Patient has hyperglycemia but no chest pain or shortness of breath.  Likely secondary to medication noncompliance from incorrectly using a new medication.  Do not feel that the patient needs admission for ACS at this time.  Discussed close follow-up with his primary care provider to further discuss his hyperglycemia and control of his diabetes.  If downtrending glucose after insulin plan to discharge home with close follow-up.   PROCEDURES:  Critical Care performed: yes  .Critical Care  Performed by: Corena Herter, MD Authorized by: Corena Herter, MD   Critical care provider statement:    Critical care time (minutes):  30   Critical care time was exclusive of:  Separately billable procedures and treating  other patients   Critical care was necessary to treat or prevent imminent or life-threatening deterioration of the following conditions:  Metabolic crisis   Critical care was time spent personally by me on the following activities:  Development of treatment plan with patient or surrogate, discussions with consultants, evaluation of patient's response to treatment, examination of patient, ordering and review of laboratory studies, ordering and review of radiographic studies, ordering and  performing treatments and interventions, pulse oximetry, re-evaluation of patient's condition and review of old charts   Patient's presentation is most consistent with acute presentation with potential threat to life or bodily function.   MEDICATIONS ORDERED IN ED: Medications  sodium chloride 0.9 % bolus 2,000 mL (0 mLs Intravenous Stopped 05/20/22 2256)  insulin aspart (novoLOG) injection 6 Units (6 Units Subcutaneous Given 05/21/22 0338)    FINAL CLINICAL IMPRESSION(S) / ED DIAGNOSES   Final diagnoses:  Hyperglycemia     Rx / DC Orders   ED Discharge Orders     None        Note:  This document was prepared using Dragon voice recognition software and may include unintentional dictation errors.   Corena Herter, MD 05/21/22 6295    Corena Herter, MD 05/21/22 [phone]

## 2022-06-02 NOTE — Progress Notes (Signed)
Remote pacemaker transmission.   

## 2022-07-05 ENCOUNTER — Other Ambulatory Visit: Payer: Self-pay

## 2022-07-05 MED ORDER — FUROSEMIDE 20 MG PO TABS: 20.0000 mg | ORAL_TABLET | ORAL | 1 refills | Status: DC | PRN

## 2022-07-05 NOTE — Telephone Encounter (Signed)
Requested Prescriptions   Signed Prescriptions Disp Refills   furosemide (LASIX) 20 MG tablet 30 tablet 1    Sig: Take 1 tablet (20 mg total) by mouth as needed.    Authorizing Provider: Debbe Odea    Ordering User: Guerry Minors

## 2022-07-19 ENCOUNTER — Other Ambulatory Visit: Payer: Self-pay

## 2022-07-19 MED ORDER — LISINOPRIL 40 MG PO TABS: 40.0000 mg | ORAL_TABLET | Freq: Every day | ORAL | 1 refills | Status: DC

## 2022-08-10 ENCOUNTER — Ambulatory Visit (INDEPENDENT_AMBULATORY_CARE_PROVIDER_SITE_OTHER): Payer: Medicare HMO

## 2022-08-10 DIAGNOSIS — I442 Atrioventricular block, complete: Secondary | ICD-10-CM

## 2022-08-10 LAB — CUP PACEART REMOTE DEVICE CHECK
Battery Voltage: 85
Date Time Interrogation Session: 20240730084303
Implantable Lead Connection Status: 753985
Implantable Lead Connection Status: 753985
Implantable Lead Implant Date: 20230130
Implantable Lead Implant Date: 20230130
Implantable Lead Location: 753859
Implantable Lead Location: 753860
Implantable Lead Model: 377171
Implantable Lead Model: 377171
Implantable Lead Serial Number: 7000391305
Implantable Lead Serial Number: 8000666643
Implantable Pulse Generator Implant Date: 20230130
Pulse Gen Model: 407145
Pulse Gen Serial Number: 70278108

## 2022-08-23 ENCOUNTER — Telehealth: Payer: Self-pay

## 2022-08-23 NOTE — Telephone Encounter (Signed)
Biotronik alert received for possible new onset AFL w/ controlled ventricular rates, duration . 20sec.  Device registered as AT although atrial rates suggest possible AFL.No OAC noted on MAR or hx noted in EPIC. Routing to Dr. Lalla Brothers prior to AF clinic d/t short in duration.

## 2022-09-01 NOTE — Progress Notes (Signed)
Remote pacemaker transmission.   

## 2022-09-06 ENCOUNTER — Other Ambulatory Visit: Payer: Self-pay

## 2022-09-06 MED ORDER — FUROSEMIDE 20 MG PO TABS: 20.0000 mg | ORAL_TABLET | ORAL | 0 refills | Status: AC | PRN

## 2022-09-06 NOTE — Telephone Encounter (Signed)
Requested Prescriptions   Signed Prescriptions Disp Refills   furosemide (LASIX) 20 MG tablet 30 tablet 0    Sig: Take 1 tablet (20 mg total) by mouth as needed. PLEASE CALL OFFICE TO SCHEDULE APPOINTMENT PRIOR TO NEXT REFILL (first attempt)    Authorizing Provider: Debbe Odea    Ordering User: Guerry Minors

## 2022-09-06 NOTE — Telephone Encounter (Signed)
last visit with Dr. Azucena Cecil on 08/15/21 with plan to f/u in 12 months. Please schedule f/u appt.  thanks

## 2022-09-08 NOTE — Telephone Encounter (Signed)
Scheduled 10/14

## 2022-09-22 ENCOUNTER — Other Ambulatory Visit: Payer: Self-pay

## 2022-09-22 MED ORDER — LISINOPRIL 40 MG PO TABS: 40.0000 mg | ORAL_TABLET | Freq: Every day | ORAL | 0 refills | Status: AC

## 2022-10-25 ENCOUNTER — Encounter: Payer: Self-pay | Admitting: Cardiology

## 2022-10-25 ENCOUNTER — Ambulatory Visit: Payer: Medicare HMO | Attending: Cardiology | Admitting: Cardiology

## 2022-10-25 VITALS — BP 130/82 | HR 93 | Ht 69.0 in | Wt 212.0 lb

## 2022-10-25 DIAGNOSIS — K219 Gastro-esophageal reflux disease without esophagitis: Secondary | ICD-10-CM | POA: Diagnosis not present

## 2022-10-25 DIAGNOSIS — Z950 Presence of cardiac pacemaker: Secondary | ICD-10-CM

## 2022-10-25 DIAGNOSIS — I10 Essential (primary) hypertension: Secondary | ICD-10-CM | POA: Diagnosis not present

## 2022-10-25 MED ORDER — PANTOPRAZOLE SODIUM 40 MG PO TBEC: 40.0000 mg | DELAYED_RELEASE_TABLET | Freq: Two times a day (BID) | ORAL | 5 refills | Status: AC

## 2022-10-25 NOTE — Patient Instructions (Signed)
Medication Instructions:   STOP Aspirin -  INCREASE Protonix - Take one tablet  (40 mg) by mouth twice a day.   *If you need a refill on your cardiac medications before your next appointment, please call your pharmacy*   Lab Work:  None Ordered  If you have labs (blood work) drawn today and your tests are completely normal, you will receive your results only by: MyChart Message (if you have MyChart) OR A paper copy in the mail If you have any lab test that is abnormal or we need to change your treatment, we will call you to review the results.   Testing/Procedures:  None Ordered   Follow-Up: At [facility], you and your health needs are our priority.  As part of our continuing mission to provide you with exceptional heart care, we have created designated Provider Care Teams.  These Care Teams include your primary Cardiologist (physician) and Advanced Practice Providers (APPs -  Physician Assistants and Nurse Practitioners) who all work together to provide you with the care you need, when you need it.  We recommend signing up for the patient portal called "MyChart".  Sign up information is provided on this After Visit Summary.  MyChart is used to connect with patients for Virtual Visits (Telemedicine).  Patients are able to view lab/test results, encounter notes, upcoming appointments, etc.  Non-urgent messages can be sent to your provider as well.   To learn more about what you can do with MyChart, go to [email or web address].    Your next appointment:   5 month(s)  Provider:   You may see Debbe Odea, MD ONLY

## 2022-10-25 NOTE — Progress Notes (Signed)
Cardiology Office Note:    Date:  10/25/2022   ID:  [name], DOB 28-Feb-1955, MRN [id number]  PCP:  Center, [facility] Providers Cardiologist:  Debbe Odea, MD Electrophysiologist:  Lanier Prude, MD     Referring MD: Center, [facility]*   Chief Complaint  Patient presents with   Follow up    12 month follow up. Patient c/o a little SOB. Meds reviewed verbally with patient.     History of Present Illness:    [name] is a 67 y.o. male with a hx of hypertension, hyperlipidemia, diabetes, CHB (s/p PPM [place] 01/2021) who presents for follow-up.  States having indigestion over the last several months, usually after eating.  Has taken over-the-counter Tums in addition to prescribed Protonix with good effect.  Compliant with BP meds as prescribed.  No dizziness or syncope.   Prior notes Echocardiogram 11/2020 normal systolic and diastolic function, mild LVH, EF 65 to 70% Cardiac monitor 11/2020 episodes of Mobitz 2 and third-degree AV block present  Past Medical History:  Diagnosis Date   Dermatophytosis of foot    Diabetes mellitus without complication (HCC)    Hypertension    Hypokalemia    Sebaceous cyst    Vitamin D deficiency     Past Surgical History:  Procedure Laterality Date   APPENDECTOMY     PACEMAKER IMPLANT N/A 02/09/2021   Procedure: PACEMAKER IMPLANT;  Surgeon: Lanier Prude, MD;  Location: [facility] INVAS[facility] CV LAB;  Service: Cardiovascular;  Laterality: N/A;    Current Medications: Current Meds  Medication Sig   amLODipine (NORVASC) 10 MG tablet Take 10 mg by mouth daily.   atorvastatin (LIPITOR) 40 MG tablet Take 40 mg by mouth daily.   furosemide (LASIX) 20 MG tablet Take 1 tablet (20 mg total) by mouth as needed. PLEASE CALL OFFICE TO SCHEDULE APPOINTMENT PRIOR TO NEXT REFILL (first attempt)   glipiZIDE (GLUCOTROL) 10 MG tablet Take 10 mg by mouth 2 (two) times daily before a meal.    hydrALAZINE (APRESOLINE) 100 MG tablet Take 1 tablet (100 mg total) by mouth 2 (two) times daily.   lisinopril (ZESTRIL) 40 MG tablet Take 1 tablet (40 mg total) by mouth daily. PLEASE CALL [phone] TO SCHEDULE YEARLY APPOINTMENT AND FOR FURTHER REFILLS. THANK YOU.   metFORMIN (GLUCOPHAGE) 1000 MG tablet Take 1,000 mg by mouth 2 (two) times daily.   [DISCONTINUED] aspirin EC 81 MG EC tablet Take 1 tablet (81 mg total) by mouth daily. Swallow whole.   [DISCONTINUED] pantoprazole (PROTONIX) 40 MG tablet Take 1 tablet (40 mg total) by mouth daily.     Allergies:   Patient has no known allergies.   Social History   Socioeconomic History   Marital status: Single    Spouse name: Not on file   Number of children: Not on file   Years of education: Not on file   Highest education level: Not on file  Occupational History   Not on file  Tobacco Use   Smoking status: Former   Smokeless tobacco: Never  Substance and Sexual Activity   Alcohol use: Yes    Comment: occ.   Drug use: Never   Sexual activity: Not on file  Other Topics Concern   Not on file  Social History Narrative   Not on file   Social Determinants of Health   Financial Resource Strain: Not on file  Food Insecurity: Not on file  Transportation Needs: Not on  file  Physical Activity: Not on file  Stress: Not on file  Social Connections: Not on file     Family History: The patient's family history includes Heart disease in his mother; Stroke in his father.  ROS:   Please see the history of present illness.     All other systems reviewed and are negative.  EKGs/Labs/Other Studies Reviewed:    The following studies were reviewed today:   EKG Interpretation Date/Time:  Monday October 25 2022 14:33:54 EDT Ventricular Rate:  93 PR Interval:    QRS Duration:  152 QT Interval:  442 QTC Calculation: 549 R Axis:   -32  Text Interpretation: Ventricular-paced rhythm Confirmed by Debbe Odea ([id number]) on  10/25/2022 2:43:53 PM    Recent Labs: 05/20/2022: ALT 21; BUN 41; Creatinine, Ser 2.04; Hemoglobin 12.8; Platelets 327; Potassium 4.3; Sodium 128  Recent Lipid Panel No results found for: "CHOL", "TRIG", "HDL", "CHOLHDL", "VLDL", "LDLCALC", "LDLDIRECT"   Risk Assessment/Calculations:         Physical Exam:    VS:  BP 130/82 (BP Location: Left Arm, Patient Position: Sitting, Cuff Size: Normal)   Pulse 93   Ht 5\' 9"  (1.753 m)   Wt 212 lb (96.2 kg)   SpO2 96%   BMI 31.31 kg/m     Wt Readings from Last 3 Encounters:  10/25/22 212 lb (96.2 kg)  05/20/22 231 lb 0.7 oz (104.8 kg)  04/23/22 230 lb 12.8 oz (104.7 kg)     GEN:  Well nourished, well developed in no acute distress HEENT: Normal NECK: No JVD; No carotid bruits CARDIAC: RRR, no murmurs, rubs, gallops RESPIRATORY:  Clear to auscultation without rales, wheezing or rhonchi  ABDOMEN: Soft, non-tender, non-distended MUSCULOSKELETAL:  No edema; No deformity  SKIN: Warm and dry NEUROLOGIC:  Alert and oriented x 3 PSYCHIATRIC:  Normal affect   ASSESSMENT:    1. Gastroesophageal reflux disease, unspecified whether esophagitis present   2. Pacemaker   3. Primary hypertension    PLAN:    In order of problems listed above:  Indigestion and GERD, improved with OTC Tums.  Stop aspirin 81, increase Protonix to 40 mg twice daily.  Follow-up with PCP for additional input if symptoms persist.  Consider GI input. Complete heart block s/p PPM 01/2021 .  Pacemaker appears to be functioning normally.  EKG showing V paced rhythm.  Follows up with device clinic. Hypertension, BP now controlled..  Continue hydralazine to 100 mg 3 times daily, lisinopril 40 mg daily, amlodipine 10 mg daily.    Follow-up in 5 months.   Medication Adjustments/Labs and Tests Ordered: Current medicines are reviewed at length with the patient today.  Concerns regarding medicines are outlined above.  Orders Placed This Encounter  Procedures   EKG 12-Lead     Meds ordered this encounter  Medications   pantoprazole (PROTONIX) 40 MG tablet    Sig: Take 1 tablet (40 mg total) by mouth 2 (two) times daily.    Dispense:  30 tablet    Refill:  5     Patient Instructions  Medication Instructions:   STOP Aspirin -  INCREASE Protonix - Take one tablet  (40 mg) by mouth twice a day.   *If you need a refill on your cardiac medications before your next appointment, please call your pharmacy*   Lab Work:  None Ordered  If you have labs (blood work) drawn today and your tests are completely normal, you will receive your results only by: MyChart Message (if  you have MyChart) OR A paper copy in the mail If you have any lab test that is abnormal or we need to change your treatment, we will call you to review the results.   Testing/Procedures:  None Ordered   Follow-Up: At [facility], you and your health needs are our priority.  As part of our continuing mission to provide you with exceptional heart care, we have created designated Provider Care Teams.  These Care Teams include your primary Cardiologist (physician) and Advanced Practice Providers (APPs -  Physician Assistants and Nurse Practitioners) who all work together to provide you with the care you need, when you need it.  We recommend signing up for the patient portal called "MyChart".  Sign up information is provided on this After Visit Summary.  MyChart is used to connect with patients for Virtual Visits (Telemedicine).  Patients are able to view lab/test results, encounter notes, upcoming appointments, etc.  Non-urgent messages can be sent to your provider as well.   To learn more about what you can do with MyChart, go to [email or web address].    Your next appointment:   5 month(s)  Provider:   You may see Debbe Odea, MD ONLY   Signed, Debbe Odea, MD  10/25/2022 3:59 PM    [facility]

## 2022-11-09 ENCOUNTER — Ambulatory Visit (INDEPENDENT_AMBULATORY_CARE_PROVIDER_SITE_OTHER): Payer: Medicare (Managed Care)

## 2022-11-09 DIAGNOSIS — I442 Atrioventricular block, complete: Secondary | ICD-10-CM | POA: Diagnosis not present

## 2022-11-10 LAB — CUP PACEART REMOTE DEVICE CHECK
Date Time Interrogation Session: 20241029072513
Implantable Lead Connection Status: 753985
Implantable Lead Connection Status: 753985
Implantable Lead Implant Date: 20230130
Implantable Lead Implant Date: 20230130
Implantable Lead Location: 753859
Implantable Lead Location: 753860
Implantable Lead Model: 377171
Implantable Lead Model: 377171
Implantable Lead Serial Number: 7000391305
Implantable Lead Serial Number: 8000666643
Implantable Pulse Generator Implant Date: 20230130
Pulse Gen Model: 407145
Pulse Gen Serial Number: 70278108

## 2022-11-19 ENCOUNTER — Other Ambulatory Visit (HOSPITAL_COMMUNITY): Payer: Self-pay

## 2022-11-19 ENCOUNTER — Encounter (HOSPITAL_COMMUNITY): Payer: Self-pay | Admitting: Physician Assistant

## 2022-11-19 ENCOUNTER — Ambulatory Visit (HOSPITAL_COMMUNITY)
Admission: RE | Admit: 2022-11-19 | Discharge: 2022-11-19 | Disposition: A | Discharge status: Home / Self Care | Payer: Medicare HMO | Source: Ambulatory Visit | Attending: Physician Assistant | Admitting: Physician Assistant

## 2022-11-19 VITALS — BP 148/70 | HR 107 | Ht 69.0 in | Wt 210.4 lb

## 2022-11-19 DIAGNOSIS — Z7901 Long term (current) use of anticoagulants: Secondary | ICD-10-CM | POA: Insufficient documentation

## 2022-11-19 DIAGNOSIS — Z823 Family history of stroke: Secondary | ICD-10-CM | POA: Diagnosis not present

## 2022-11-19 DIAGNOSIS — I480 Paroxysmal atrial fibrillation: Secondary | ICD-10-CM | POA: Insufficient documentation

## 2022-11-19 DIAGNOSIS — Z7984 Long term (current) use of oral hypoglycemic drugs: Secondary | ICD-10-CM | POA: Insufficient documentation

## 2022-11-19 DIAGNOSIS — E119 Type 2 diabetes mellitus without complications: Secondary | ICD-10-CM | POA: Insufficient documentation

## 2022-11-19 DIAGNOSIS — Z950 Presence of cardiac pacemaker: Secondary | ICD-10-CM | POA: Insufficient documentation

## 2022-11-19 DIAGNOSIS — I10 Essential (primary) hypertension: Secondary | ICD-10-CM | POA: Insufficient documentation

## 2022-11-19 DIAGNOSIS — D6869 Other thrombophilia: Secondary | ICD-10-CM | POA: Insufficient documentation

## 2022-11-19 DIAGNOSIS — I442 Atrioventricular block, complete: Secondary | ICD-10-CM | POA: Diagnosis not present

## 2022-11-19 MED ORDER — APIXABAN 5 MG PO TABS: 5.0000 mg | ORAL_TABLET | Freq: Two times a day (BID) | ORAL | 3 refills | Status: DC

## 2022-11-19 NOTE — Progress Notes (Signed)
Primary Care Physician: Center, [facility] Primary Cardiologist: Debbe Odea, MD Electrophysiologist: Lanier Prude, MD  Referring Physician: Dr Gerri Lins is a 67 y.o. male with a history of HTN, HLD, DM, CHB s/p PPM, atrial fibrillation who presents for follow up in the [facility].  The patient was initially diagnosed with atrial fibrillation on remote device checks. His remote check on 11/09/22 showed a 1% afib burden with the longest episode lasting a little under 2 hours. Patient was unaware of his arrhythmia. Patient has a CHADS2VASC score of 3.  On follow up today, patient is in SR. He denies significant snoring or alcohol use. There were no specific triggers that he could identify.   Today, he denies symptoms of palpitations, chest pain, shortness of breath, orthopnea, PND, lower extremity edema, dizziness, presyncope, syncope, snoring, daytime somnolence, bleeding, or neurologic sequela. The patient is tolerating medications without difficulties and is otherwise without complaint today.    Atrial Fibrillation Risk Factors:  he does not have symptoms or diagnosis of sleep apnea. he does not have a history of rheumatic fever. he does not have a history of alcohol use. The patient does not have a history of early familial atrial fibrillation or other arrhythmias.  Atrial Fibrillation Management history:  Previous antiarrhythmic drugs: none Previous cardioversions: none Previous ablations: none Anticoagulation history: none  ROS- All systems are reviewed and negative except as per the HPI above.  Past Medical History:  Diagnosis Date   Dermatophytosis of foot    Diabetes mellitus without complication (HCC)    Hypertension    Hypokalemia    Sebaceous cyst    Vitamin D deficiency     Current Outpatient Medications  Medication Sig Dispense Refill   amLODipine (NORVASC) 10 MG tablet Take 10 mg by mouth  daily.     ammonium lactate (LAC-HYDRIN) 12 % lotion Apply topically.     apixaban (ELIQUIS) 5 MG TABS tablet Take 1 tablet (5 mg total) by mouth 2 (two) times daily. 60 tablet 3   atorvastatin (LIPITOR) 40 MG tablet Take 40 mg by mouth daily.     furosemide (LASIX) 20 MG tablet Take 1 tablet (20 mg total) by mouth as needed. PLEASE CALL OFFICE TO SCHEDULE APPOINTMENT PRIOR TO NEXT REFILL (first attempt) 30 tablet 0   glipiZIDE (GLUCOTROL) 10 MG tablet Take 10 mg by mouth 2 (two) times daily before a meal.     hydrALAZINE (APRESOLINE) 100 MG tablet Take 1 tablet (100 mg total) by mouth 2 (two) times daily. 180 tablet 2   lisinopril (ZESTRIL) 40 MG tablet Take 1 tablet (40 mg total) by mouth daily. PLEASE CALL [phone] TO SCHEDULE YEARLY APPOINTMENT AND FOR FURTHER REFILLS. THANK YOU. 30 tablet 0   metFORMIN (GLUCOPHAGE) 1000 MG tablet Take 1,000 mg by mouth 2 (two) times daily.     pantoprazole (PROTONIX) 40 MG tablet Take 1 tablet (40 mg total) by mouth 2 (two) times daily. 30 tablet 5   No current facility-administered medications for this encounter.    Physical Exam: BP (!) 148/70   Pulse (!) 107   Ht 5\' 9"  (1.753 m)   Wt 95.4 kg   BMI 31.07 kg/m   GEN: Well nourished, well developed in no acute distress NECK: No JVD; No carotid bruits CARDIAC: Regular rate and rhythm, no murmurs, rubs, gallops RESPIRATORY:  Clear to auscultation without rales, wheezing or rhonchi  ABDOMEN: Soft, non-tender, non-distended EXTREMITIES:  No edema; No deformity   Wt Readings from Last 3 Encounters:  11/19/22 95.4 kg  10/25/22 96.2 kg  05/20/22 104.8 kg     EKG today demonstrates  V paced rhythm with underlying sinus tach Vent. rate 107 BPM PR interval * ms QRS duration 116 ms QT/QTcB 380/507 ms  Echo 03/05/22 demonstrated   1. Left ventricular ejection fraction, by estimation, is 60 to 65%. The  left ventricle has normal function. The left ventricle has no regional  wall motion  abnormalities. There is mild left ventricular hypertrophy.  Left ventricular diastolic parameters are indeterminate.   2. Right ventricular systolic function is normal. The right ventricular  size is normal.   3. Left atrial size was moderately dilated.   4. The mitral valve is normal in structure. Mild mitral valve  regurgitation.   5. The aortic valve is tricuspid. Aortic valve regurgitation is not  visualized.    CHA2DS2-VASc Score = 3  The patient's score is based upon: CHF History: 0 HTN History: 1 Diabetes History: 1 Stroke History: 0 Vascular Disease History: 0 Age Score: 1 Gender Score: 0       ASSESSMENT AND PLAN: Paroxysmal Atrial Fibrillation (ICD10:  I48.0) The patient's CHA2DS2-VASc score is 3, indicating a 3.2% annual risk of stroke.   General education about afib provided and questions answered. We also discussed his stroke risk and the risks and benefits of anticoagulation. He does have several family members who have had strokes. Will start Eliquis 5 mg BID Continue to monitor afib burden on device  Secondary Hypercoagulable State (ICD10:  D68.69) The patient is at significant risk for stroke/thromboembolism based upon his CHA2DS2-VASc Score of 3.  Start Apixaban (Eliquis).   HTN Stable on current regimen  CHB S/p PPM, followed by Dr Lalla Brothers and the device clinic.    Follow up in the AF clinic in one month.       Jorja Loa PA-C Afib Clinic [facility] [address] [place], [place] [address] [phone]

## 2022-11-19 NOTE — Patient Instructions (Signed)
Start Eliquis 5mg twice a day 

## 2022-11-22 ENCOUNTER — Other Ambulatory Visit: Payer: Self-pay | Admitting: Internal Medicine

## 2022-11-22 ENCOUNTER — Ambulatory Visit
Admission: RE | Admit: 2022-11-22 | Discharge: 2022-11-22 | Disposition: A | Discharge status: Home / Self Care | Payer: Medicare HMO | Source: Ambulatory Visit | Attending: Internal Medicine | Admitting: Internal Medicine

## 2022-11-22 ENCOUNTER — Ambulatory Visit
Admission: RE | Admit: 2022-11-22 | Discharge: 2022-11-22 | Disposition: A | Discharge status: Home / Self Care | Payer: Medicare HMO | Attending: Internal Medicine | Admitting: Internal Medicine

## 2022-11-22 DIAGNOSIS — R52 Pain, unspecified: Secondary | ICD-10-CM | POA: Insufficient documentation

## 2022-11-29 NOTE — Progress Notes (Signed)
Remote pacemaker transmission.   

## 2022-12-01 ENCOUNTER — Other Ambulatory Visit: Payer: Self-pay | Admitting: Internal Medicine

## 2022-12-01 DIAGNOSIS — E11621 Type 2 diabetes mellitus with foot ulcer: Secondary | ICD-10-CM

## 2022-12-01 DIAGNOSIS — M86679 Other chronic osteomyelitis, unspecified ankle and foot: Secondary | ICD-10-CM

## 2022-12-08 ENCOUNTER — Other Ambulatory Visit: Payer: Self-pay | Admitting: Internal Medicine

## 2022-12-08 DIAGNOSIS — R6889 Other general symptoms and signs: Secondary | ICD-10-CM

## 2022-12-08 DIAGNOSIS — E11621 Type 2 diabetes mellitus with foot ulcer: Secondary | ICD-10-CM

## 2022-12-10 ENCOUNTER — Ambulatory Visit (HOSPITAL_COMMUNITY)
Admission: RE | Admit: 2022-12-10 | Discharge: 2022-12-10 | Disposition: A | Discharge status: Home / Self Care | Payer: Medicare HMO | Source: Ambulatory Visit | Attending: Internal Medicine | Admitting: Internal Medicine

## 2022-12-10 DIAGNOSIS — E1169 Type 2 diabetes mellitus with other specified complication: Secondary | ICD-10-CM | POA: Insufficient documentation

## 2022-12-10 DIAGNOSIS — M869 Osteomyelitis, unspecified: Secondary | ICD-10-CM | POA: Diagnosis present

## 2022-12-10 DIAGNOSIS — M86679 Other chronic osteomyelitis, unspecified ankle and foot: Secondary | ICD-10-CM | POA: Insufficient documentation

## 2022-12-10 DIAGNOSIS — E11621 Type 2 diabetes mellitus with foot ulcer: Secondary | ICD-10-CM | POA: Diagnosis present

## 2022-12-10 DIAGNOSIS — L97509 Non-pressure chronic ulcer of other part of unspecified foot with unspecified severity: Secondary | ICD-10-CM | POA: Diagnosis present

## 2022-12-10 MED ORDER — GADOBUTROL 1 MMOL/ML IV SOLN
10.0000 mL | Freq: Once | INTRAVENOUS | Status: AC | PRN
  Administered 2022-12-10: 10 mL via INTRAVENOUS

## 2022-12-15 ENCOUNTER — Ambulatory Visit
Admission: RE | Admit: 2022-12-15 | Discharge: 2022-12-15 | Disposition: A | Discharge status: Home / Self Care | Payer: Medicare HMO | Source: Ambulatory Visit | Attending: Internal Medicine | Admitting: Internal Medicine

## 2022-12-15 DIAGNOSIS — R6889 Other general symptoms and signs: Secondary | ICD-10-CM | POA: Diagnosis present

## 2022-12-15 DIAGNOSIS — E11621 Type 2 diabetes mellitus with foot ulcer: Secondary | ICD-10-CM | POA: Insufficient documentation

## 2022-12-17 ENCOUNTER — Ambulatory Visit (HOSPITAL_COMMUNITY)
Admission: RE | Admit: 2022-12-17 | Discharge: 2022-12-17 | Disposition: A | Discharge status: Home / Self Care | Payer: Medicare HMO | Source: Ambulatory Visit | Attending: Physician Assistant | Admitting: Physician Assistant

## 2022-12-17 ENCOUNTER — Encounter (HOSPITAL_COMMUNITY): Payer: Self-pay | Admitting: Physician Assistant

## 2022-12-17 VITALS — BP 130/90 | HR 80 | Ht 69.0 in | Wt 209.6 lb

## 2022-12-17 DIAGNOSIS — I442 Atrioventricular block, complete: Secondary | ICD-10-CM | POA: Insufficient documentation

## 2022-12-17 DIAGNOSIS — Z7901 Long term (current) use of anticoagulants: Secondary | ICD-10-CM | POA: Insufficient documentation

## 2022-12-17 DIAGNOSIS — I480 Paroxysmal atrial fibrillation: Secondary | ICD-10-CM | POA: Diagnosis not present

## 2022-12-17 DIAGNOSIS — I10 Essential (primary) hypertension: Secondary | ICD-10-CM | POA: Diagnosis present

## 2022-12-17 DIAGNOSIS — E119 Type 2 diabetes mellitus without complications: Secondary | ICD-10-CM | POA: Insufficient documentation

## 2022-12-17 DIAGNOSIS — Z950 Presence of cardiac pacemaker: Secondary | ICD-10-CM | POA: Diagnosis not present

## 2022-12-17 DIAGNOSIS — D6869 Other thrombophilia: Secondary | ICD-10-CM | POA: Insufficient documentation

## 2022-12-17 DIAGNOSIS — E785 Hyperlipidemia, unspecified: Secondary | ICD-10-CM | POA: Insufficient documentation

## 2022-12-17 DIAGNOSIS — R9431 Abnormal electrocardiogram [ECG] [EKG]: Secondary | ICD-10-CM | POA: Insufficient documentation

## 2022-12-17 DIAGNOSIS — Z7984 Long term (current) use of oral hypoglycemic drugs: Secondary | ICD-10-CM | POA: Insufficient documentation

## 2022-12-17 DIAGNOSIS — I4891 Unspecified atrial fibrillation: Secondary | ICD-10-CM | POA: Diagnosis present

## 2022-12-17 LAB — BASIC METABOLIC PANEL
Anion gap: 12 (ref 5–15)
BUN: 21 mg/dL (ref 8–23)
CO2: 19 mmol/L — ABNORMAL LOW (ref 22–32)
Calcium: 9 mg/dL (ref 8.9–10.3)
Chloride: 107 mmol/L (ref 98–111)
Creatinine, Ser: 1.7 mg/dL — ABNORMAL HIGH (ref 0.61–1.24)
GFR, Estimated: 44 mL/min — ABNORMAL LOW (ref >60)
Glucose, Bld: 198 mg/dL — ABNORMAL HIGH (ref 70–99)
Potassium: 4.3 mmol/L (ref 3.5–5.1)
Sodium: 138 mmol/L (ref 135–145)

## 2022-12-17 LAB — CBC
HCT: 37.9 % — ABNORMAL LOW (ref 39.0–52.0)
Hemoglobin: 12.6 g/dL — ABNORMAL LOW (ref 13.0–17.0)
MCH: 27.3 pg (ref 26.0–34.0)
MCHC: 33.2 g/dL (ref 30.0–36.0)
MCV: 82 fL (ref 80.0–100.0)
Platelets: 294 10*3/uL (ref 150–400)
RBC: 4.62 MIL/uL (ref 4.22–5.81)
RDW: 14.9 % (ref 11.5–15.5)
WBC: 9 10*3/uL (ref 4.0–10.5)
nRBC: 0 % (ref 0.0–0.2)

## 2022-12-17 MED ORDER — APIXABAN 5 MG PO TABS: 5.0000 mg | ORAL_TABLET | Freq: Two times a day (BID) | ORAL | 11 refills | Status: AC

## 2022-12-17 NOTE — Progress Notes (Signed)
Primary Care Physician: Center, [facility] Primary Cardiologist: Debbe Odea, MD Electrophysiologist: Lanier Prude, MD  Referring Physician: Dr Gerri Lins is a 67 y.o. male with a history of HTN, HLD, DM, CHB s/p PPM, atrial fibrillation who presents for follow up in the [facility] Fibr[facility].  The patient was initially diagnosed with atrial fibrillation on remote device checks. His remote check on 11/09/22 showed a 1% afib burden with the longest episode lasting a little under 2 hours. Patient was unaware of his arrhythmia. Patient has a CHADS2VASC score of 3.  On follow up today, patient reports that he has done well since his last visit. No bleeding issues since starting anticoagulation. No interim alerts for afib on his device.   Today, he denies symptoms of palpitations, chest pain, shortness of breath, orthopnea, PND, lower extremity edema, dizziness, presyncope, syncope, snoring, daytime somnolence, bleeding, or neurologic sequela. The patient is tolerating medications without difficulties and is otherwise without complaint today.    Atrial Fibrillation Risk Factors:  he does not have symptoms or diagnosis of sleep apnea. he does not have a history of rheumatic fever. he does not have a history of alcohol use. The patient does not have a history of early familial atrial fibrillation or other arrhythmias.  Atrial Fibrillation Management history:  Previous antiarrhythmic drugs: none Previous cardioversions: none Previous ablations: none Anticoagulation history: Eliquis  ROS- All systems are reviewed and negative except as per the HPI above.  Past Medical History:  Diagnosis Date   Dermatophytosis of foot    Diabetes mellitus without complication (HCC)    Hypertension    Hypokalemia    Sebaceous cyst    Vitamin D deficiency     Current Outpatient Medications  Medication Sig Dispense Refill   amLODipine  (NORVASC) 10 MG tablet Take 10 mg by mouth daily.     ammonium lactate (LAC-HYDRIN) 12 % lotion Apply topically.     atorvastatin (LIPITOR) 40 MG tablet Take 40 mg by mouth daily.     furosemide (LASIX) 20 MG tablet Take 1 tablet (20 mg total) by mouth as needed. PLEASE CALL OFFICE TO SCHEDULE APPOINTMENT PRIOR TO NEXT REFILL (first attempt) 30 tablet 0   glipiZIDE (GLUCOTROL) 10 MG tablet Take 10 mg by mouth 2 (two) times daily before a meal.     hydrALAZINE (APRESOLINE) 100 MG tablet Take 1 tablet (100 mg total) by mouth 2 (two) times daily. 180 tablet 2   lisinopril (ZESTRIL) 40 MG tablet Take 1 tablet (40 mg total) by mouth daily. PLEASE CALL [phone] TO SCHEDULE YEARLY APPOINTMENT AND FOR FURTHER REFILLS. THANK YOU. 30 tablet 0   metFORMIN (GLUCOPHAGE) 1000 MG tablet Take 1,000 mg by mouth 2 (two) times daily.     pantoprazole (PROTONIX) 40 MG tablet Take 1 tablet (40 mg total) by mouth 2 (two) times daily. 30 tablet 5   apixaban (ELIQUIS) 5 MG TABS tablet Take 1 tablet (5 mg total) by mouth 2 (two) times daily. 60 tablet 11   No current facility-administered medications for this encounter.    Physical Exam: BP (!) 130/90   Pulse 80   Ht 5\' 9"  (1.753 m)   Wt 95.1 kg   BMI 30.95 kg/m   GEN: Well nourished, well developed in no acute distress NECK: No JVD; No carotid bruits CARDIAC: Regular rate and rhythm, no murmurs, rubs, gallops RESPIRATORY:  Clear to auscultation without rales, wheezing or rhonchi  ABDOMEN: Soft,  non-tender, non-distended EXTREMITIES:  No edema; No deformity    Wt Readings from Last 3 Encounters:  12/17/22 95.1 kg  11/19/22 95.4 kg  10/25/22 96.2 kg     EKG today demonstrates  A sense V paced with prolonged AV conduction Vent. rate 80 BPM PR interval 370 ms QRS duration 116 ms QT/QTcB 378/435 ms  Echo 03/05/22 demonstrated   1. Left ventricular ejection fraction, by estimation, is 60 to 65%. The  left ventricle has normal function. The left  ventricle has no regional  wall motion abnormalities. There is mild left ventricular hypertrophy.  Left ventricular diastolic parameters are indeterminate.   2. Right ventricular systolic function is normal. The right ventricular  size is normal.   3. Left atrial size was moderately dilated.   4. The mitral valve is normal in structure. Mild mitral valve  regurgitation.   5. The aortic valve is tricuspid. Aortic valve regurgitation is not  visualized.    CHA2DS2-VASc Score = 3  The patient's score is based upon: CHF History: 0 HTN History: 1 Diabetes History: 1 Stroke History: 0 Vascular Disease History: 0 Age Score: 1 Gender Score: 0       ASSESSMENT AND PLAN: Paroxysmal Atrial Fibrillation (ICD10:  I48.0) The patient's CHA2DS2-VASc score is 3, indicating a 3.2% annual risk of stroke.   Patient appears to be maintaining SR Continue Eliquis 5 mg BID Check cbc/bmet today Continue to monitor afib burden on device  Secondary Hypercoagulable State (ICD10:  D68.69) The patient is at significant risk for stroke/thromboembolism based upon his CHA2DS2-VASc Score of 3.  Start Apixaban (Eliquis).   HTN Stable on current regimen  CHB S/p PPM, followed by Dr Lalla Brothers and the device clinic    Follow up with Dr Ernestina Patches per recall.       Jorja Loa PA-C Afib Clinic [facility] [address] [place], [place] [address] [phone]

## 2022-12-27 ENCOUNTER — Other Ambulatory Visit: Payer: Self-pay | Admitting: Internal Medicine

## 2022-12-27 ENCOUNTER — Encounter: Payer: Medicare HMO | Attending: Physician Assistant | Admitting: Physician Assistant

## 2022-12-27 DIAGNOSIS — E11621 Type 2 diabetes mellitus with foot ulcer: Secondary | ICD-10-CM | POA: Diagnosis present

## 2022-12-27 DIAGNOSIS — Z7901 Long term (current) use of anticoagulants: Secondary | ICD-10-CM | POA: Insufficient documentation

## 2022-12-27 DIAGNOSIS — I739 Peripheral vascular disease, unspecified: Secondary | ICD-10-CM

## 2022-12-27 DIAGNOSIS — L97512 Non-pressure chronic ulcer of other part of right foot with fat layer exposed: Secondary | ICD-10-CM | POA: Diagnosis not present

## 2022-12-27 DIAGNOSIS — I10 Essential (primary) hypertension: Secondary | ICD-10-CM | POA: Insufficient documentation

## 2022-12-27 DIAGNOSIS — I480 Paroxysmal atrial fibrillation: Secondary | ICD-10-CM | POA: Diagnosis not present

## 2022-12-27 DIAGNOSIS — Z950 Presence of cardiac pacemaker: Secondary | ICD-10-CM | POA: Insufficient documentation

## 2022-12-27 DIAGNOSIS — M869 Osteomyelitis, unspecified: Secondary | ICD-10-CM

## 2022-12-29 NOTE — Progress Notes (Signed)
TIVIS, DESANCTIS A ([id number]) [id number] Page 1 of 5 Visit Report for 12/27/2022 Abuse Risk Screen Details Patient Name: Date of Service: Arnold Long, [name] 12/27/2022 2:00 PM Medical Record Number: [id number] Patient Account Number: [id number] Date of Birth/Sex: Treating RN: 09-13-1955 (67 y.o. [name] Primary Care Kahleb Mcclane: Sumner Boast Other Clinician: Referring Louisa Favaro: Treating Tzirel Leonor/Extender: Milton Ferguson Weeks in Treatment: 0 Abuse Risk Screen Items Answer Electronic Signature(s) Signed: 12/28/2022 4:17:14 PM By: Midge Aver MSN RN CNS WTA Entered By: Midge Aver on 12/27/2022 11:19:02 -------------------------------------------------------------------------------- Activities of Daily Living Details Patient Name: Date of Service: Arnold Long, DO NA LD A. 12/27/2022 2:00 PM Medical Record Number: [id number] Patient Account Number: [id number] Date of Birth/Sex: Treating RN: 1955/02/21 (67 y.o. [name] Primary Care Keshawn Sundberg: Sumner Boast Other Clinician: Referring Domenick Quebedeaux: Treating Aquilla Shambley/Extender: Milton Ferguson Weeks in Treatment: 0 Activities of Daily Living Items Answer Activities of Daily Living (Please select one for each item) Drive Automobile Completely Able T Medications ake Completely Able Use T elephone Completely Able Care for Appearance Completely Able Use T oilet Completely Able Bath / Shower Completely Able Dress Self Completely Able Feed Self Completely Able Walk Completely Able Get In / Out Bed Completely Able Housework Completely Able Prepare Meals Completely Able Handle Money Completely Able Shop for Self Completely GERONE, GROSSHEIM A ([id number]) [id number] Page 2 of 5 Electronic Signature(s) Signed: 12/28/2022 4:17:14 PM By: Midge Aver MSN RN CNS WTA Entered By: Midge Aver on 12/27/2022  11:19:35 -------------------------------------------------------------------------------- Education Screening Details Patient Name: Date of Service: Arnold Long, DO NA LD A. 12/27/2022 2:00 PM Medical Record Number: [id number] Patient Account Number: [id number] Date of Birth/Sex: Treating RN: Jun 04, 1955 (67 y.o. [name] Primary Care Romani Wilbon: Sumner Boast Other Clinician: Referring Janvi Ammar: Treating Paris Hohn/Extender: Renee Rival in Treatment: 0 Learning Preferences/Education Level/Primary Language Learning Preference: Explanation, Demonstration Preferred Language: English Cognitive Barrier Language Barrier: No Translator Needed: No Memory Deficit: No Emotional Barrier: No Cultural/Religious Beliefs Affecting Medical Care: No Physical Barrier Impaired Vision: No Impaired Hearing: No Decreased Hand dexterity: No Knowledge/Comprehension Knowledge Level: High Comprehension Level: High Ability to understand written instructions: High Ability to understand verbal instructions: High Motivation Anxiety Level: Calm Cooperation: Cooperative Education Importance: Acknowledges Need Interest in Health Problems: Asks Questions Perception: Coherent Willingness to Engage in Self-Management High Activities: Readiness to Engage in Self-Management High Activities: Electronic Signature(s) Signed: 12/28/2022 4:17:14 PM By: Midge Aver MSN RN CNS WTA Entered By: Midge Aver on 12/27/2022 11:19:56 ITHAN, GOWARD A ([id number]) [id number] Page 3 of 5 -------------------------------------------------------------------------------- Fall Risk Assessment Details Patient Name: Date of Service: Arnold Long, DO NA LD A. 12/27/2022 2:00 PM Medical Record Number: [id number] Patient Account Number: [id number] Date of Birth/Sex: Treating RN: [birth year] (67 y.o. [name] Primary Care Arend Bahl: Sumner Boast Other  Clinician: Referring Jabrea Kallstrom: Treating Nichalos Brenton/Extender: Milton Ferguson Weeks in Treatment: 0 Fall Risk Assessment Items Have you had 2 or more falls in the last 12 monthso 0 No Have you had any fall that resulted in injury in the last 12 monthso 0 No FALLS RISK SCREEN History of falling - immediate or within 3 months 0 No Secondary diagnosis (Do you have 2 or more medical diagnoseso) 0 No Ambulatory aid None/bed rest/wheelchair/nurse 0 No Crutches/cane/walker 0 No Furniture 0 No Intravenous therapy Access/Saline/Heparin Lock 0 No Gait/Transferring Normal/ bed rest/ wheelchair 0 No Weak (short steps with or without shuffle, stooped but able to lift head while  walking, may seek 0 No support from furniture) Impaired (short steps with shuffle, may have difficulty arising from chair, head down, impaired 0 No balance) Mental Status Oriented to own ability 0 Yes Electronic Signature(s) Signed: 12/28/2022 4:17:14 PM By: Midge Aver MSN RN CNS WTA Entered By: Midge Aver on 12/27/2022 11:20:20 -------------------------------------------------------------------------------- Foot Assessment Details Patient Name: Date of Service: Arnold Long, DO NA LD A. 12/27/2022 2:00 PM Medical Record Number: [id number] Patient Account Number: [id number] Date of Birth/Sex: Treating RN: [birth year] (67 y.o. [name] Primary Care Chanc Kervin: Sumner Boast Other Clinician: Referring Lavender Stanke: Treating Carlyle Achenbach/Extender: Milton Ferguson Weeks in Treatment: 0 Foot Assessment Items Site Locations [place], [place] A ([id number]) [id number] Page 4 of 5 + = Sensation present, - = Sensation absent, C = Callus, U = Ulcer R = Redness, W = Warmth, M = Maceration, PU = Pre-ulcerative lesion F = Fissure, S = Swelling, D = Dryness Assessment Right: Left: Other Deformity: No No Prior Foot Ulcer: No No Prior Amputation: No No Charcot  Joint: No No Ambulatory Status: Ambulatory Without Help Gait: Steady Electronic Signature(s) Signed: 12/28/2022 4:17:14 PM By: Midge Aver MSN RN CNS WTA Entered By: Midge Aver on 12/27/2022 11:20:52 -------------------------------------------------------------------------------- Nutrition Risk Screening Details Patient Name: Date of Service: Arnold Long, DO NA LD A. 12/27/2022 2:00 PM Medical Record Number: [id number] Patient Account Number: [id number] Date of Birth/Sex: Treating RN: 1955-04-14 (67 y.o. [name] Primary Care Citlally Captain: Sumner Boast Other Clinician: Referring Torrin Frein: Treating Daneisha Surges/Extender: Milton Ferguson Weeks in Treatment: 0 Height (in): 69 Weight (lbs): 210 Body Mass Index (BMI): 31 Nutrition Risk Screening Items Score Screening NUTRITION RISK SCREEN: I have an illness or condition that made me change the kind and/or amount of food I eat 0 No I eat fewer than two meals per day 0 No I eat few fruits and vegetables, or milk products 0 No I have three or more drinks of beer, liquor or wine almost every day 0 No I have tooth or mouth problems that make it hard for me to eat 0 No I don't always have enough money to buy the food I need 0 No JORAH, GROSSE A ([id number]) [id number] Page 5 of 5 I eat alone most of the time 0 No I take three or more different prescribed or over-the-counter drugs a day 1 Yes Without wanting to, I have lost or gained 10 pounds in the last six months 0 No I am not always physically able to shop, cook and/or feed myself 0 No Nutrition Protocols Good Risk Protocol 0 No interventions needed Moderate Risk Protocol High Risk Proctocol Risk Level: Good Risk Score: 1 Electronic Signature(s) Signed: 12/28/2022 4:17:14 PM By: Midge Aver MSN RN CNS WTA Entered By: Midge Aver on 12/27/2022 11:20:30

## 2022-12-29 NOTE — Progress Notes (Signed)
GREGGORY, MABEN A ([id number]) [id number] Page 1 of 1 Visit Report for 12/27/2022 HBO Patient Questionnaire Details Patient Name: Date of Service: Arnold Long, [name] 12/27/2022 2:00 PM Medical Record Number: [id number] Patient Account Number: [id number] Date of Birth/Sex: Treating RN: [birth year] (67 y.o. [name] Primary Care Sowmya Partridge: Sumner Boast Other Clinician: Referring Augie Vane: Treating Toshi Ishii/Extender: Renee Rival in Treatment: 0 The following information was scribed by: Haywood Pao The following information was scribed for: Allen Derry [facility] Patient Questionnaire Items Answer A "Yes" answers must be brought to the hyperbaric physician's attention. ny Breathing or Lung problemso No Currently use tobacco productso No Used tobacco products in the pasto Yes Heart problemso Yes Seen a doctor for any heart or blood pressure problemso Yes If yes, provide the name of doctor: Glynda Jaeger [place] Do you take water pills (diuretic)o Yes If yes, last time taken: Today Diabeteso Yes On Diabetes pillo Yes If yes, last time taken: Today On Insulino No Dialysiso No Eye problems like glaucomao No Ear problems or surgeryo No Sinus Problemso No Cancero No Confinement Anxiety (Claustrophobia- fear of confined places)o No Any medical implants/devices that are fully or partially implanted or attached to your bodyo Yes Pregnanto No Seizureso No Date of last: EKG: 12/17/2022 CBC: 12/17/2022 Electronic Signature(s) Signed: 12/28/2022 4:17:14 PM By: Midge Aver MSN RN CNS WTA Entered By: Midge Aver on 12/27/2022 12:35:04

## 2022-12-29 NOTE — Progress Notes (Signed)
HAM, CONK A ([id number]) [id number] Page 1 of 11 Visit Report for 12/27/2022 Allergy List Details Patient Name: Date of Service: Brian Long, [name] 12/27/2022 2:00 PM Medical Record Number: [id number] Patient Account Number: [id number] Date of Birth/Sex: Treating RN: 1955-01-28 (67 y.o. [name] Primary Care Felicitas Sine: Sumner Boast Other Clinician: Referring Skipper Dacosta: Treating Elenna Spratling/Extender: Milton Ferguson Weeks in Treatment: 0 Allergies Active Allergies No Known Allergies Allergy Notes Electronic Signature(s) Signed: 12/28/2022 4:17:14 PM By: Midge Aver MSN RN CNS WTA Entered By: Midge Aver on 12/27/2022 11:12:56 -------------------------------------------------------------------------------- Arrival Information Details Patient Name: Date of Service: Brian Long, DO NA LD A. 12/27/2022 2:00 PM Medical Record Number: [id number] Patient Account Number: [id number] Date of Birth/Sex: Treating RN: Jun 25, 1955 (67 y.o. [name] Primary Care Nazar Kuan: Sumner Boast Other Clinician: Referring Giavana Rooke: Treating Nadina Fomby/Extender: Renee Rival in Treatment: 0 Visit Information Patient Arrived: Ambulatory Arrival Time: 14:06 Accompanied By: self Transfer Assistance: None Patient Identification Verified: Yes Secondary Verification Process Completed: Yes Patient Requires Transmission-Based Precautions: No Patient Has Alerts: Yes Patient Alerts: Patient on Blood Thinner Diabetic Type 2 Eliquis Electronic Signature(s) Signed: 12/28/2022 4:17:14 PM By: Midge Aver MSN RN CNS [address] CHORD, MCCURTY A ([id number]) PM By: Midge Aver MSN RN CNS Margarita Rana [phone].pdf Page 2 of 11 Signed: 12/28/2022 4:17:14 Entered By: Midge Aver on 12/27/2022 11:07:46 -------------------------------------------------------------------------------- Clinic Level of Care Assessment  Details Patient Name: Date of Service: Brian Long, DO NA LD A. 12/27/2022 2:00 PM Medical Record Number: [id number] Patient Account Number: [id number] Date of Birth/Sex: Treating RN: 28-Aug-1955 (67 y.o. [name] Primary Care Dylana Shaw: Sumner Boast Other Clinician: Referring Zayin Valadez: Treating Arley Garant/Extender: Renee Rival in Treatment: 0 Clinic Level of Care Assessment Items TOOL 1 Quantity Score X- 1 0 Use when EandM and Procedure is performed on INITIAL visit ASSESSMENTS - Nursing Assessment / Reassessment X- 1 20 General Physical Exam (combine w/ comprehensive assessment (listed just below) when performed on new pt. evals) X- 1 25 Comprehensive Assessment (HX, ROS, Risk Assessments, Wounds Hx, etc.) ASSESSMENTS - Wound and Skin Assessment / Reassessment []  - 0 Dermatologic / Skin Assessment (not related to wound area) ASSESSMENTS - Ostomy and/or Continence Assessment and Care []  - 0 Incontinence Assessment and Management []  - 0 Ostomy Care Assessment and Management (repouching, etc.) PROCESS - Coordination of Care X - Simple Patient / Family Education for ongoing care 1 15 []  - 0 Complex (extensive) Patient / Family Education for ongoing care X- 1 10 Staff obtains Chiropractor, Records, T Results / Process Orders est []  - 0 Staff telephones HHA, Nursing Homes / Clarify orders / etc []  - 0 Routine Transfer to another Facility (non-emergent condition) []  - 0 Routine Hospital Admission (non-emergent condition) X- 1 15 New Admissions / Manufacturing engineer / Ordering NPWT Apligraf, etc. , []  - 0 Emergency Hospital Admission (emergent condition) PROCESS - Special Needs []  - 0 Pediatric / Minor Patient Management []  - 0 Isolation Patient Management []  - 0 Hearing / Language / Visual special needs []  - 0 Assessment of Community assistance (transportation, D/C planning, etc.) []  - 0 Additional assistance / Altered mentation []   - 0 Support Surface(s) Assessment (bed, cushion, seat, etc.) INTERVENTIONS - Miscellaneous []  - 0 External ear exam []  - 0 Patient Transfer (multiple staff / Nurse, adult / Similar devices) []  - 0 Simple Staple / Suture removal (25 or less) Borg, Esias A ([id number]) [id number] Page 3 of 11 []  - 0 Complex Staple /  Suture removal (26 or more) []  - 0 Hypo/Hyperglycemic Management (do not check if billed separately) X- 1 15 Ankle / Brachial Index (ABI) - do not check if billed separately Has the patient been seen at the hospital within the last three years: Yes Total Score: 100 Level Of Care: New/Established - Level 3 Electronic Signature(s) Signed: 12/28/2022 4:17:14 PM By: Midge Aver MSN RN CNS WTA Entered By: Midge Aver on 12/27/2022 13:16:19 -------------------------------------------------------------------------------- Encounter Discharge Information Details Patient Name: Date of Service: Brian Long, DO NA LD A. 12/27/2022 2:00 PM Medical Record Number: [id number] Patient Account Number: [id number] Date of Birth/Sex: Treating RN: [birth year] (67 y.o. [name] Primary Care Dawan Farney: Sumner Boast Other Clinician: Referring Tycho Cheramie: Treating Kumiko Fishman/Extender: Renee Rival in Treatment: 0 Encounter Discharge Information Items Post Procedure Vitals Discharge Condition: Stable Temperature (F): 98.2 Ambulatory Status: Ambulatory Pulse (bpm): 72 Discharge Destination: Home Respiratory Rate (breaths/min): 18 Transportation: Private Auto Blood Pressure (mmHg): 158/88 Accompanied By: self Schedule Follow-up Appointment: Yes Clinical Summary of Care: Electronic Signature(s) Signed: 12/27/2022 4:21:39 PM By: Midge Aver MSN RN CNS WTA Entered By: Midge Aver on 12/27/2022 13:21:39 -------------------------------------------------------------------------------- Lower Extremity Assessment Details Patient  Name: Date of Service: Brian Long, DO NA LD A. 12/27/2022 2:00 PM Medical Record Number: [id number] Patient Account Number: [id number] Date of Birth/Sex: Treating RN: [birth year] (67 y.o. [name] Primary Care Nou Chard: Sumner Boast Other Clinician: Referring Guillermina Shaft: Treating Rayquan Amrhein/Extender: Milton Ferguson Weeks in Treatment: 0 Edema Assessment Assessed: Kyra Searles: No] [Right: No] B[LeftIKECHUKWU, ZAHRA A ([id number])] [[id number]: [id number] Page 4 of 11] [Left: Edema] [Right: :] Calf Left: Right: Point of Measurement: 40 cm From Medial Instep 36 cm Ankle Left: Right: Point of Measurement: 12 cm From Medial Instep 22.3 cm Knee To Floor Left: Right: From Medial Instep 43 cm Vascular Assessment Pulses: Dorsalis Pedis Palpable: [Right:Yes] Doppler Audible: [Right:Yes] Extremity colors, hair growth, and conditions: Extremity Color: [Right:Normal] Hair Growth on Extremity: [Right:No] Temperature of Extremity: [Right:Warm] Capillary Refill: [Right:< 3 seconds] Dependent Rubor: [Right:No] Blanched when Elevated: [Right:No] Lipodermatosclerosis: [Right:No] Blood Pressure: Brachial: [Right:158] Ankle: [Right:Dorsalis Pedis: 146 0.92] Toe Nail Assessment Left: Right: Thick: No Discolored: No Deformed: No Improper Length and Hygiene: No Electronic Signature(s) Signed: 12/28/2022 4:17:14 PM By: Midge Aver MSN RN CNS WTA Entered By: Midge Aver on 12/27/2022 11:37:53 -------------------------------------------------------------------------------- Multi Wound Chart Details Patient Name: Date of Service: Brian Long, DO NA LD A. 12/27/2022 2:00 PM Medical Record Number: [id number] Patient Account Number: [id number] Date of Birth/Sex: Treating RN: [birth year] (66 y.o. [name] Primary Care Ugo Thoma: Sumner Boast Other Clinician: Referring Kiya Eno: Treating Favor Kreh/Extender: Milton Ferguson Weeks in Treatment: 0 Vital Signs Height(in): 69 Pulse(bpm): 72 Weight(lbs): 210 Blood Pressure(mmHg): 158/88 Body Mass Index(BMI): 31 Temperature(F): 98.2 Respiratory Rate(breaths/min): 18 Deshazo, Trevyn A ([id number]) [id number] Page 5 of 11 [1:Photos:] [N/A:N/A] Right, Anterior T Second oe N/A N/A Wound Location: Gradually Appeared N/A N/A Wounding Event: Diabetic Wound/Ulcer of the Lower N/A N/A Primary Etiology: Extremity Hypertension, Type II Diabetes N/A N/A Comorbid History: 11/27/2022 N/A N/A Date Acquired: 0 N/A N/A Weeks of Treatment: Open N/A N/A Wound Status: No N/A N/A Wound Recurrence: 2x1.2x0.2 N/A N/A Measurements L x W x D (cm) 1.885 N/A N/A A (cm) : rea 0.377 N/A N/A Volume (cm) : Grade 3 N/A N/A Classification: Medium N/A N/A Exudate A mount: Serosanguineous N/A N/A Exudate Type: red, brown N/A N/A Exudate Color: Small (1-33%) N/A N/A Granulation A mount: Pink N/A N/A Granulation Quality: Medium (  34-66%) N/A N/A Necrotic A mount: Eschar, Adherent Slough N/A N/A Necrotic Tissue: Fat Layer (Subcutaneous Tissue): Yes N/A N/A Exposed Structures: Fascia: No Tendon: No Muscle: No Joint: No Bone: No None N/A N/A Epithelialization: Debridement - Excisional N/A N/A Debridement: Pre-procedure Verification/Time Out 15:09 N/A N/A Taken: Lidocaine 4% Topical Solution N/A N/A Pain Control: Necrotic/Eschar, Muscle, N/A N/A Tissue Debrided: Subcutaneous, Slough Skin/Subcutaneous Tissue/Muscle N/A N/A Level: 1.88 N/A N/A Debridement A (sq cm): rea Curette N/A N/A Instrument: Minimum N/A N/A Bleeding: Pressure N/A N/A Hemostasis Achieved: 0 N/A N/A Procedural Pain: 0 N/A N/A Post Procedural Pain: Debridement Treatment Response: Procedure was tolerated well N/A N/A Post Debridement Measurements L x 2x1.2x0.2 N/A N/A W x D (cm) 0.377 N/A N/A Post Debridement Volume: (cm) Debridement N/A  N/A Procedures Performed: Treatment Notes Wound #1 (Toe Second) Wound Laterality: Right, Anterior Cleanser Byram Ancillary Kit - 15 Day Supply Discharge Instruction: Use supplies as instructed; Kit contains: (15) Saline Bullets; (15) 3x3 Gauze; 15 pr Gloves Soap and Water Discharge Instruction: Gently cleanse wound with antibacterial soap, rinse and pat dry prior to dressing wounds Vashe 5.8 (oz) Discharge Instruction: Use vashe 5.8 (oz) as directed Peri-Wound Care Topical Primary Dressing Prisma 4.34 (in) Discharge Instruction: Moisten w/normal saline or sterile water; Cover wound as directed. Do not remove from wound bed. Secondary Dressing ABD Pad 5x9 (in/in) CRISTYAN, SPORE A ([id number]) [id number] Page 6 of 11 Discharge Instruction: Cover with ABD pad Secured With Medipore T - 59M Medipore H Soft Cloth Surgical T ape ape, 2x2 (in/yd) Kerlix Roll Sterile or Non-Sterile 6-ply 4.5x4 (yd/yd) Discharge Instruction: Apply Kerlix as directed Compression Wrap Compression Stockings Add-Ons Electronic Signature(s) Signed: 12/27/2022 4:15:45 PM By: Midge Aver MSN RN CNS WTA Entered By: Midge Aver on 12/27/2022 13:15:44 -------------------------------------------------------------------------------- Multi-Disciplinary Care Plan Details Patient Name: Date of Service: Brian Long, DO NA LD A. 12/27/2022 2:00 PM Medical Record Number: [id number] Patient Account Number: [id number] Date of Birth/Sex: Treating RN: 1955-02-15 (67 y.o. [name] Primary Care Faryn Sieg: Sumner Boast Other Clinician: Referring Chancy Claros: Treating Shantal Roan/Extender: Milton Ferguson Weeks in Treatment: 0 Active Inactive Necrotic Tissue Nursing Diagnoses: Impaired tissue integrity related to necrotic/devitalized tissue Knowledge deficit related to management of necrotic/devitalized tissue Goals: Necrotic/devitalized tissue will be minimized in the  wound bed Date Initiated: 12/27/2022 Target Resolution Date: 01/27/2023 Goal Status: Active Patient/caregiver will verbalize understanding of reason and process for debridement of necrotic tissue Date Initiated: 12/27/2022 Target Resolution Date: 01/27/2023 Goal Status: Active Interventions: Assess patient pain level pre-, during and post procedure and prior to discharge Provide education on necrotic tissue and debridement process Treatment Activities: Apply topical anesthetic as ordered : 12/27/2022 Excisional debridement : 12/27/2022 Notes: Orientation to the Wound Care Program Nursing Diagnoses: Knowledge deficit related to the wound healing center program Goals: Patient/caregiver will verbalize understanding of the Wound Healing Center Program Date Initiated: 12/27/2022 Target Resolution Date: 01/03/2023 WANE, CERRETA ([id number]) [id number] Page 7 of 11 Goal Status: Active Interventions: Provide education on orientation to the wound center Notes: Osteomyelitis Nursing Diagnoses: Infection: osteomyelitis Knowledge deficit related to disease process and management Goals: Diagnostic evaluation for osteomyelitis completed as ordered Date Initiated: 12/27/2022 Target Resolution Date: 01/11/2023 Goal Status: Active Patient/caregiver will verbalize understanding of disease process and disease management Date Initiated: 12/27/2022 Target Resolution Date: 01/03/2023 Goal Status: Active Patient's osteomyelitis will resolve Date Initiated: 12/27/2022 Target Resolution Date: 02/27/2023 Goal Status: Active Signs and symptoms for osteomyelitis will be recognized and promptly addressed Date Initiated: 12/27/2022 Target Resolution Date:  01/11/2023 Goal Status: Active Interventions: Provide education on osteomyelitis Treatment Activities: Consult for HBO : 12/27/2022 MRI : 12/17/2022 T ordered outside of clinic : 12/27/2022 est White Blood Cell  Scan : 12/17/2022 Notes: Wound/Skin Impairment Nursing Diagnoses: Impaired tissue integrity Knowledge deficit related to ulceration/compromised skin integrity Goals: Patient/caregiver will verbalize understanding of skin care regimen Date Initiated: 12/27/2022 Target Resolution Date: 01/27/2023 Goal Status: Active Ulcer/skin breakdown will have a volume reduction of 30% by week 4 Date Initiated: 12/27/2022 Target Resolution Date: 01/27/2023 Goal Status: Active Ulcer/skin breakdown will have a volume reduction of 50% by week 8 Date Initiated: 12/27/2022 Target Resolution Date: 02/27/2023 Goal Status: Active Ulcer/skin breakdown will have a volume reduction of 80% by week 12 Date Initiated: 12/27/2022 Target Resolution Date: 03/27/2023 Goal Status: Active Ulcer/skin breakdown will heal within 14 weeks Date Initiated: 12/27/2022 Target Resolution Date: 04/10/2023 Goal Status: Active Interventions: Assess patient/caregiver ability to obtain necessary supplies Assess patient/caregiver ability to perform ulcer/skin care regimen upon admission and as needed Assess ulceration(s) every visit Provide education on ulcer and skin care Screen for HBO Treatment Activities: Consult for HBO : 12/27/2022 Referred to DME Kehaul[employer] Fruin for dressing supplies : 12/27/2022 Skin care regimen initiated : 12/27/2022 Notes: Electronic Signature(s) DIAR, FELMLEE A ([id number]) [id number] Page 8 of 11 Signed: 12/27/2022 4:20:14 PM By: Midge Aver MSN RN CNS WTA Previous Signature: 12/27/2022 4:20:09 PM Version By: Midge Aver MSN RN CNS WTA Entered By: Midge Aver on 12/27/2022 13:20:14 -------------------------------------------------------------------------------- Pain Assessment Details Patient Name: Date of Service: Brian Long, DO NA LD A. 12/27/2022 2:00 PM Medical Record Number: [id number] Patient Account Number: [id number] Date of Birth/Sex: Treating RN: 1956-01-11 (67 y.o.  [name] Primary Care Abhijot Straughter: Sumner Boast Other Clinician: Referring Kaytee Taliercio: Treating Shanetta Nicolls/Extender: Milton Ferguson Weeks in Treatment: 0 Active Problems Location of Pain Severity and Description of Pain Patient Has Paino No Site Locations Pain Management and Medication Current Pain Management: Electronic Signature(s) Signed: 12/28/2022 4:17:14 PM By: Midge Aver MSN RN CNS WTA Entered By: Midge Aver on 12/27/2022 11:10:05 -------------------------------------------------------------------------------- Patient/Caregiver Education Details Patient Name: Date of Service: Brian Long, DO NA LD A. 12/16/2024andnbsp2:00 PM Medical Record Number: [id number] Patient Account Number: [id number] AMERY, JARA A ([id number]) [id number] Page 9 of 11 Date of Birth/Gender: Treating RN: [birth year] (67 y.o. [name] Primary Care Physician: Sumner Boast Other Clinician: Referring Physician: Treating Physician/Extender: Renee Rival in Treatment: 0 Education Assessment Education Provided To: Patient Education Topics Provided Infection: Handouts: CDC antimicrobial patient education_English Methods: Explain/Verbal Responses: State content correctly Welcome [facility]-New Patient Packet: o Handouts: Hyperbaric Oxygen, Welcome T [facility] o Methods: Explain/Verbal Responses: State content correctly Wound Debridement: Handouts: Wound Debridement Methods: Explain/Verbal Responses: State content correctly Wound/Skin Impairment: Handouts: Caring for Your Ulcer Methods: Explain/Verbal Responses: State content correctly Electronic Signature(s) Signed: 12/28/2022 4:17:14 PM By: Midge Aver MSN RN CNS WTA Entered By: Midge Aver on 12/27/2022 13:20:53 -------------------------------------------------------------------------------- Wound Assessment  Details Patient Name: Date of Service: Brian Long, DO NA LD A. 12/27/2022 2:00 PM Medical Record Number: [id number] Patient Account Number: [id number] Date of Birth/Sex: Treating RN: [birth year] (67 y.o. [name] Primary Care Earlean Fidalgo: Sumner Boast Other Clinician: Referring Mizani Dilday: Treating Marnita Poirier/Extender: Milton Ferguson Weeks in Treatment: 0 Wound Status Wound Number: 1 Primary Etiology: Diabetic Wound/Ulcer of the Lower Extremity Wound Location: Right, Anterior T Second oe Wound Status: Open Wounding Event: Gradually Appeared Comorbid History: Hypertension, Type II Diabetes Date Acquired: 11/27/2022 Weeks Of  Treatment: 0 Clustered Wound: No Photos DARCY, SCHLEPP A ([id number]) [id number] Page 10 of 11 Wound Measurements Length: (cm) 2 Width: (cm) 1.2 Depth: (cm) 0.2 Area: (cm) 1.885 Volume: (cm) 0.377 % Reduction in Area: % Reduction in Volume: Epithelialization: None Wound Description Classification: Grade 3 Exudate Amount: Medium Exudate Type: Serosanguineous Exudate Color: red, brown Foul Odor After Cleansing: No Slough/Fibrino Yes Wound Bed Granulation Amount: Small (1-33%) Exposed Structure Granulation Quality: Pink Fascia Exposed: No Necrotic Amount: Medium (34-66%) Fat Layer (Subcutaneous Tissue) Exposed: Yes Necrotic Quality: Eschar, Adherent Slough Tendon Exposed: No Muscle Exposed: No Joint Exposed: No Bone Exposed: No Treatment Notes Wound #1 (Toe Second) Wound Laterality: Right, Anterior Cleanser Byram Ancillary Kit - 15 Day Supply Discharge Instruction: Use supplies as instructed; Kit contains: (15) Saline Bullets; (15) 3x3 Gauze; 15 pr Gloves Soap and Water Discharge Instruction: Gently cleanse wound with antibacterial soap, rinse and pat dry prior to dressing wounds Vashe 5.8 (oz) Discharge Instruction: Use vashe 5.8 (oz) as directed Peri-Wound Care Topical Primary  Dressing Prisma 4.34 (in) Discharge Instruction: Moisten w/normal saline or sterile water; Cover wound as directed. Do not remove from wound bed. Secondary Dressing ABD Pad 5x9 (in/in) Discharge Instruction: Cover with ABD pad Secured With Medipore T - 67M Medipore H Soft Cloth Surgical T ape ape, 2x2 (in/yd) Kerlix Roll Sterile or Non-Sterile 6-ply 4.5x4 (yd/yd) Discharge Instruction: Apply Kerlix as directed Compression Wrap Compression Stockings Add-Ons Electronic Signature(s) Signed: 12/28/2022 4:17:14 PM By: Midge Aver MSN RN CNS WTA Entered By: Midge Aver on 12/27/2022 12:10:39 [name] ([id number]) [id number] Page 11 of 11 -------------------------------------------------------------------------------- Vitals Details Patient Name: Date of Service: Brian Long, DO NA LD A. 12/27/2022 2:00 PM Medical Record Number: [id number] Patient Account Number: [id number] Date of Birth/Sex: Treating RN: [birth year] (67 y.o. [name] Primary Care Gabrien Mentink: Sumner Boast Other Clinician: Referring Kregg Cihlar: Treating [place] Lasseter/Extender: Milton Ferguson Weeks in Treatment: 0 Vital Signs Time Taken: 14:10 Temperature (F): 98.2 Height (in): 69 Pulse (bpm): 72 Source: Stated Respiratory Rate (breaths/min): 18 Weight (lbs): 210 Blood Pressure (mmHg): 158/88 Body Mass Index (BMI): 31 Reference Range: 80 - 120 mg / dl Electronic Signature(s) Signed: 12/28/2022 4:17:14 PM By: Midge Aver MSN RN CNS WTA Entered By: Midge Aver on 12/27/2022 11:12:43

## 2022-12-29 NOTE — Progress Notes (Addendum)
MATRIM, GARDY [name] ([id number]) [id number] Page 1 of 13 Visit Report for 12/27/2022 Chief Complaint Document Details Patient Name: Date of Service: Brian Long, DO [place] LD [name]. 12/27/2022 2:00 PM Medical Record Number: [id number] Patient Account Number: [id number] Date of Birth/Sex: Treating RN: 1955-10-09 (67 y.o. [name] Primary Care Provider: Sumner Boast Other Clinician: Referring Provider: Treating Provider/Extender: Milton Ferguson Weeks in Treatment: 0 Information Obtained from: Patient Chief Complaint Right second toe ulcer Electronic Signature(s) Signed: 12/27/2022 2:53:06 PM By: Allen Derry PA-C Entered By: Allen Derry on 12/27/2022 11:53:06 -------------------------------------------------------------------------------- Debridement Details Patient Name: Date of Service: Brian Long, DO NA LD [name]. 12/27/2022 2:00 PM Medical Record Number: [id number] Patient Account Number: [id number] Date of Birth/Sex: Treating RN: 04-27-1955 (67 y.o. [name] Primary Care Provider: Sumner Boast Other Clinician: Referring Provider: Treating Provider/Extender: Milton Ferguson Weeks in Treatment: 0 Debridement Performed for Assessment: Wound #1 Right,Anterior T Second oe Performed By: Physician Allen Derry, PA-C The following information was scribed by: Midge Aver The information was scribed for: Allen Derry Debridement Type: Debridement Severity of Tissue Pre Debridement: Fat layer exposed Level of Consciousness (Pre-procedure): Awake and Alert Pre-procedure Verification/Time Out Yes - 15:09 Taken: Start Time: 15:09 Pain Control: Lidocaine 4% T opical Solution Percent of Wound Bed Debrided: 100% T Area Debrided (cm): otal 1.88 Tissue and other material debrided: Viable, Non-Viable, Eschar, Muscle, Slough, Subcutaneous, Slough Level: Skin/Subcutaneous Tissue/Muscle Debridement Description:  Excisional Instrument: Curette Bleeding: Minimum [name], [name] ([id number]) [id number] Page 2 of 13 Hemostasis Achieved: Pressure Procedural Pain: 0 Post Procedural Pain: 0 Response to Treatment: Procedure was tolerated well Level of Consciousness (Post- Awake and Alert procedure): Post Debridement Measurements of Total Wound Length: (cm) 2 Width: (cm) 1.2 Depth: (cm) 0.2 Volume: (cm) 0.377 Character of Wound/Ulcer Post Debridement: Stable Severity of Tissue Post Debridement: Necrosis of muscle Post Procedure Diagnosis Same as Pre-procedure Electronic Signature(s) Signed: 12/27/2022 4:20:01 PM By: Allen Derry PA-C Signed: 12/28/2022 4:17:14 PM By: Midge Aver MSN RN CNS WTA Entered By: Midge Aver on 12/27/2022 12:13:43 -------------------------------------------------------------------------------- HPI Details Patient Name: Date of Service: Brian Long, DO NA LD [name]. 12/27/2022 2:00 PM Medical Record Number: [id number] Patient Account Number: [id number] Date of Birth/Sex: Treating RN: May 16, 1955 (67 y.o. [name] Primary Care Provider: Sumner Boast Other Clinician: Referring Provider: Treating Provider/Extender: Milton Ferguson Weeks in Treatment: 0 History of Present Illness Chronic/Inactive Conditions Condition 1: 12-27-2022 patient's ABI in the office was 0.92 on the right. With that being said he did have [name] bilateral lower extremity arterial duplex evaluation and this was on December 16, 2022. The result of this showed abnormal left extremity arterial Doppler consistent with stenosis within the distal superficial femoral and popliteal arteries. CTA of the abdominal aorta including lower extremity runoffs is recommended. There was stated to be no significant plaque formation visualized within the arteries of the right lower extremity HPI Description: 12-27-2022 upon evaluation today patient presents for initial  inspection here in the clinic as [name] referral from his primary care provider, Dr. Sherrin Daisy. With that being said I did discuss with the patient the treatment that is been undertaken up to this point as well as reviewing what I could in epic although I could not see any of the actual PCP notes it sounds as if she has been doing an awesome job with regard to the care of the patient up to this point. He does have osteomyelitis of the second toe right foot. With that being said  there is [name] wound here as well there is some muscle/tendon exposed some of this necrotic as well over the area in question. 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 I do believe that he has had excellent care and has been on antibiotics since actually 8 November initially he was placed on it appears doxycycline and then transition to Bactrim which is what he is still on currently. Fortunately he has seen some signs of improvement with regard to the infection which I think is definitely good but I think he still at high risk as far as amputation of the toe is concerned and I discussed that with him today as well. Overall I believe that he is [name] strong candidate for hyperbaric oxygen therapy to try to get his wound to heal. I discussed with the patient how this can increase his chances of healing from around 50% with what we have going on right now to closer to the 85% with HBO therapy. This is obviously [name] fairly significant shift and though not 100% gives him the best chance I think to try to get this wound healed without progressing to further intervention such as amputation. Patient does have [name] history of diabetes mellitus type 2, hypertension, atrial fibrillation, [name]-term use of anticoagulant therapy, and he does have [name] cardiac pacemaker. I did review notes through epic. He has had [name] recent EKG which did not appear to be significantly different from an EKG even about [name] month ago which  was good news. This EKG was performed 12-17-2022 and the when it was compared to was 11-19-2022 and 10-25-2022. Subsequently the patient also has undergone an arterial lower extremity duplex which showed on 12-15-2022 that there was no significant plaque formation visualized within the arteries of the right lower extremity. It would appear that he has sufficient arterial flow to heal although he does have further follow-up with vascular as well. This is mainly in regard to his left lower extremity. He did have an MRI on December 10, 2022 which shows that he does have osteomyelitis which was stated to be acute at that point of the middle phalanx of the second toe right foot. Again he has been on antibiotics since November 19, 2022. 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 I did review the patient's CBC and currently white blood cell count was 9.0 hemoglobin 12.6 hematocrit 37.9 otherwise everything was within normal limits. I did also review the basic metabolic panel which showed that the patient does have elevated creatinine with [name] normal BUN. Estimated GFR was 44. Upon reviewing notes on 12-17-2022 also came across the note where the patient has had an echo on March 05, 2022 which did demonstrated left ventricular ejection fraction estimated to be 60 to 65% with normal function of the left ventricle and no regional wall abnormalities. There was just mild ventricular hypertrophy. Patient's most recent hemoglobin A1c was November 04, 2022 and was at 9.0. [name], [name] ([id number]) [id number] Page 3 of 13 Electronic Signature(s) Signed: 12/27/2022 4:11:54 PM By: Allen Derry PA-C Entered By: Allen Derry on 12/27/2022 13:11:54 -------------------------------------------------------------------------------- Physical Exam Details Patient Name: Date of Service: Brian Long, DO NA LD [name]. 12/27/2022 2:00 PM Medical Record Number:  [id number] Patient Account Number: [id number] Date of Birth/Sex: Treating RN: [birth year] (67 y.o. [name] Primary Care Provider: Sumner Boast Other Clinician: Referring Provider: Treating Provider/Extender: Milton Ferguson Weeks in  Treatment: 0 Constitutional patient is hypertensive.. pulse regular and within target range for patient.Marland Kitchen respirations regular, non-labored and within target range for patient.Marland Kitchen temperature within target range for patient.. Well-nourished and well-hydrated in no acute distress. Eyes conjunctiva clear no eyelid edema noted. pupils equal round and reactive to light and accommodation. Ears, Nose, Mouth, and Throat no gross abnormality of ear auricles or external auditory canals. normal hearing noted during conversation. mucus membranes moist. Respiratory normal breathing without difficulty. Cardiovascular 1+ dorsalis pedis/posterior tibialis pulses. no clubbing, cyanosis, significant edema, <3 sec cap refill. Musculoskeletal normal gait and posture. no significant deformity or arthritic changes, no loss or range of motion, no clubbing. Psychiatric this patient is able to make decisions and demonstrates good insight into disease process. Alert and Oriented x 3. pleasant and cooperative. Notes Upon evaluation today patient appears to be doing okay in regard to his wound there is there is some need for sharp debridement here at this point. I discussed that with the patient and I explained how this is going to help the wound heal getting the necrotic tissue away and allowing for good tissue to fill in. He voiced understanding. Also think he is [name] possibility of [name] good candidate for [name] skin substitute as well although we are gena work on getting the HBO approved and we will subsequently start it next week likely began looking towards how things are improving or not with standard wound care measures. Therefore not seeing any improvement with  the collagen that I think that [name] skin substitute would be an appropriate way to go next. Electronic Signature(s) Signed: 12/27/2022 4:12:44 PM By: Allen Derry PA-C Entered By: Allen Derry on 12/27/2022 13:12:44 [name] ([id number]) [id number] Page 4 of 13 -------------------------------------------------------------------------------- Physician Orders Details Patient Name: Date of Service: Brian Long, DO NA LD [name]. 12/27/2022 2:00 PM Medical Record Number: [id number] Patient Account Number: [id number] Date of Birth/Sex: Treating RN: [birth year] (67 y.o. [name] Primary Care Provider: Sumner Boast Other Clinician: Referring Provider: Treating Provider/Extender: Milton Ferguson Weeks in Treatment: 0 The following information was scribed by: Midge Aver The information was scribed for: Allen Derry Verbal / Phone Orders: No Diagnosis Coding ICD-10 Coding Code Description E11.621 Type 2 diabetes mellitus with foot ulcer L97.512 Non-pressure chronic ulcer of other part of right foot with fat layer exposed I10 Essential (primary) hypertension I48.0 Paroxysmal atrial fibrillation Z79.01 [name] term (current) use of anticoagulants Z95.0 Presence of cardiac pacemaker Follow-up Appointments Return Appointment in 1 week. Bathing/ Shower/ Hygiene May shower; gently cleanse wound with antibacterial soap, rinse and pat dry prior to dressing wounds Anesthetic (Use 'Patient Medications' Section for Anesthetic Order Entry) Lidocaine applied to wound bed Hyperbaric Oxygen Therapy Wound #1 Right,Anterior T Second oe Evaluate for HBO Therapy Indication and location: - Right 2nd T Ulcer, wagoner grade 3 by MRI oe If appropriate for treatment, begin HBOT per protocol: 2.0 ATA for 90 Minutes without [name] Breaks ir One treatment per day (delivered Monday through Friday unless otherwise specified in Special Instructions below): Total # of  Treatments: - 40 [name] ntihistamine 30 minutes prior to HBO Treatment, difficulty clearing ears. Finger stick Blood Glucose Pre- and Post- HBOT Treatment. Follow Hyperbaric Oxygen Glycemia Protocol Wound Treatment Wound #1 - T Second oe Wound Laterality: Right, Anterior Cleanser: Byram Ancillary Kit - 15 Day Supply (DME) (Generic) Every Other Day/30 Days Discharge Instructions: Use supplies as instructed; Kit contains: (15) Saline Bullets; (15) 3x3 Gauze; 15 pr Gloves Cleanser: Soap and Water  Every Other Day/30 Days Discharge Instructions: Gently cleanse wound with antibacterial soap, rinse and pat dry prior to dressing wounds Cleanser: Vashe 5.8 (oz) Every Other Day/30 Days Discharge Instructions: Use vashe 5.8 (oz) as directed Prim Dressing: Prisma 4.34 (in) (DME) (Generic) Every Other Day/30 Days ary Discharge Instructions: Moisten w/normal saline or sterile water; Cover wound as directed. Do not remove from wound bed. Secondary Dressing: ABD Pad 5x9 (in/in) (DME) (Generic) Every Other Day/30 Days Discharge Instructions: Cover with ABD pad Secured With: Medipore T - 22M Medipore H Soft Cloth Surgical T ape ape, 2x2 (in/yd) (DME) (Generic) Every Other Day/30 Days Secured With: Kerlix Roll Sterile or Non-Sterile 6-ply 4.5x4 (yd/yd) (DME) (Generic) Every Other Day/30 Days Discharge Instructions: Apply Kerlix as directed Laboratory Comprehensive 2000 panel in Serum or Plasma (CHEM-panel) LOINC Code: [id number] Convenience Name: Comprehensive metabolic panel=CMS lbumin [Mass/volume] in serum or plasma (CHEM) [name] [name], [name] ([id number]) [id number] Page 5 of 13 LOINC Code: 1751-7 Convenience Name: Albumin in serum or plasma Prealbumin [Mass/volume] in Serum or Plasma (CHEM) LOINC Code: 14338-8 Convenience Name: Prealbumin GLYCEMIA INTERVENTIONS PROTOCOL PRE-HBO GLYCEMIA INTERVENTIONS ACTION INTERVENTION Obtain pre-HBO capillary blood glucose (ensure 1  physician order is in chart). [name]. Notify HBO physician and await physician orders. 2 If result is 70 mg/dl or below: B. If the result meets the hospital definition of [name] critical result, follow hospital policy. [name]. Give patient an 8 ounce Glucerna Shake, an 8 ounce Ensure, or 8 ounces of [name] Glucerna/Ensure equivalent dietary supplement*. B. Wait 30 minutes. If result is 71 mg/dl to 644 mg/dl: C. Retest patients capillary blood glucose (CBG). D. If result greater than or equal to 110 mg/dl, proceed with HBO. If result less than 110 mg/dl, notify HBO physician and consider holding HBO. If result is 131 mg/dl to 034 mg/dl: [name]. Proceed with HBO. [name]. Notify HBO physician and await physician orders. B. It is recommended to hold HBO and do If result is 250 mg/dl or greater: blood/urine ketone testing. C. If the result meets the hospital definition of [name] critical result, follow hospital policy. POST-HBO GLYCEMIA INTERVENTIONS ACTION INTERVENTION Obtain post HBO capillary blood glucose (ensure 1 physician order is in chart). [name]. Notify HBO physician and await physician orders. 2 If result is 70 mg/dl or below: B. If the result meets the hospital definition of [name] critical result, follow hospital policy. [name]. Give patient an 8 ounce Glucerna Shake, an 8 ounce Ensure, or 8 ounces of [name] Glucerna/Ensure equivalent dietary supplement*. B. Wait 15 minutes for symptoms of If result is 71 mg/dl to 742 mg/dl: hypoglycemia (i.e. nervousness, anxiety, sweating, chills, clamminess, irritability, confusion, tachycardia or dizziness). C. If patient asymptomatic, discharge patient. If patient symptomatic, repeat capillary blood glucose (CBG) and notify HBO physician. If result is 101 mg/dl to 595 mg/dl: [name]. Discharge patient. [name]. Notify HBO physician and await physician orders. B. It is recommended to do blood/urine ketone If result is 250 mg/dl or greater: testing. C. If the result meets the hospital  definition of [name] critical result, follow hospital policy. *Juice or candies are NOT equivalent products. If patient refuses the Glucerna or Ensure, please consult the hospital dietitian for an appropriate substitute. Electronic Signature(s) Signed: 12/31/2022 12:01:12 PM By: Midge Aver MSN RN CNS WTA Signed: 12/31/2022 12:16:54 PM By: Allen Derry PA-C Previous Signature: 12/27/2022 4:20:01 PM Version By: Allen Derry PA-C Previous Signature: 12/28/2022 4:17:14 PM Version By: Midge Aver MSN RN CNS WTA Previous Signature: 12/27/2022 3:30:02 PM Version By: Allen Derry PA-C  Entered By: Midge Aver on 12/29/2022 12:57:19 [name] ([id number]) [id number] Page 6 of 13 Prescription 12/27/2022 -------------------------------------------------------------------------------- [name]. Allen Derry PA-C Patient Name: Provider: 1955/07/06 [id number] Date of Birth: NPI#: Judie Petit [id number] Sex: DEA #: [phone] Phone #: License #: UPN: Patient Address: [address] [facility] [place], [place] [address] [facility] [address], [address] [place], [place] [address] [phone] Allergies No Known Allergies Provider's Orders Comprehensive 2000 panel in Serum or Plasma LOINC Code: [id number] Convenience Name: Comprehensive metabolic panel=CMS Hand Signature: Date(s): Prescription 12/27/2022 Authement, Jaksen [name]. Allen Derry PA-C Patient Name: Provider: Dec 04, 1955 [id number] Date of Birth: NPI#: Judie Petit [id number] Sex: DEA #: [phone] Phone #: License #: UPN: Patient Address: [address] [facility] [place], [place] [address] [facility] [address], [address] [place], [place] [address] [phone] Allergies No Known Allergies Provider's Orders Albumin [Mass/volume] in serum or plasma LOINC Code:  1751-7 Convenience Name: Albumin in serum or plasma Hand Signature: Date(s): Prescription 12/27/2022 [place], [place]ue [name]. Allen Derry PA-C Patient Name: Provider: 1955-12-24 [id number] Date of Birth: NPI#: Judie Petit [id number] Sex: DEA #: [phone] Phone #: License #: UPN: Patient Address: [address] [facility] [place], [place] [address] [facility] [place], [place] [name] ([id number]) [id number] Page 7 of 13 [address] [place], [place] [address] [phone] Allergies No Known Allergies Provider's Orders Prealbumin [Mass/volume] in Serum or Plasma LOINC Code: 14338-8 Convenience Name: Prealbumin Hand Signature: Date(s): Electronic Signature(s) Signed: 12/31/2022 12:01:12 PM By: Midge Aver MSN RN CNS WTA Signed: 12/31/2022 12:16:54 PM By: Allen Derry PA-C Entered By: Midge Aver on 12/29/2022 12:57:19 -------------------------------------------------------------------------------- Problem List Details Patient Name: Date of Service: Brian Long, DO NA LD [name]. 12/27/2022 2:00 PM Medical Record Number: [id number] Patient Account Number: [id number] Date of Birth/Sex: Treating RN: Sep 01, 1955 (67 y.o. [name] Primary Care Provider: Sumner Boast Other Clinician: Referring Provider: Treating Provider/Extender: Milton Ferguson Weeks in Treatment: 0 Active Problems ICD-10 Encounter Code Description Active Date MDM Diagnosis E11.621 Type 2 diabetes mellitus with foot ulcer 12/27/2022 No Yes L97.512 Non-pressure chronic ulcer of other part of right foot with fat layer exposed 12/27/2022 No Yes I10 Essential (primary) hypertension 12/27/2022 No Yes I48.0 Paroxysmal atrial fibrillation 12/27/2022 No Yes Z79.01 [name] term (current) use of anticoagulants 12/27/2022 No Yes Z95.0 Presence of cardiac pacemaker 12/27/2022 No Yes [name], [name] ([id number])  [id number] Page 8 of 13 Inactive Problems Resolved Problems Electronic Signature(s) Signed: 12/27/2022 2:51:30 PM By: Allen Derry PA-C Entered By: Allen Derry on 12/27/2022 11:51:30 -------------------------------------------------------------------------------- Progress Note Details Patient Name: Date of Service: Brian Long, DO NA LD [name]. 12/27/2022 2:00 PM Medical Record Number: [id number] Patient Account Number: [id number] Date of Birth/Sex: Treating RN: 1955/07/04 (67 y.o. [name] Primary Care Provider: Sumner Boast Other Clinician: Referring Provider: Treating Provider/Extender: Milton Ferguson Weeks in Treatment: 0 Subjective Chief Complaint Information obtained from Patient Right second toe ulcer History of Present Illness (HPI) Chronic/Inactive Condition: 12-27-2022 patient's ABI in the office was 0.92 on the right. With that being said he did have [name] bilateral lower extremity arterial duplex evaluation and this was on December 16, 2022. The result of this showed abnormal left extremity arterial Doppler consistent with stenosis within the distal superficial femoral and popliteal arteries. CTA of the abdominal aorta including lower extremity runoffs is recommended. There was stated to be no significant plaque formation  visualized within the arteries of the right lower extremity 12-27-2022 upon evaluation today patient presents for initial inspection here in the clinic as [name] referral from his primary care provider, Dr. Sherrin Daisy. With that being said I did discuss with the patient the treatment that is been undertaken up to this point as well as reviewing what I could in epic although I could not see any of the actual PCP notes it sounds as if she has been doing an awesome job with regard to the care of the patient up to this point. He does have osteomyelitis of the second toe right foot. With that being said there is [name] wound  here as well there is some muscle/tendon exposed some of this necrotic as well over the area in question. 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 I do believe that he has had excellent care and has been on antibiotics since actually 8 November initially he was placed on it appears doxycycline and then transition to Bactrim which is what he is still on currently. Fortunately he has seen some signs of improvement with regard to the infection which I think is definitely good but I think he still at high risk as far as amputation of the toe is concerned and I discussed that with him today as well. Overall I believe that he is [name] strong candidate for hyperbaric oxygen therapy to try to get his wound to heal. I discussed with the patient how this can increase his chances of healing from around 50% with what we have going on right now to closer to the 85% with HBO therapy. This is obviously [name] fairly significant shift and though not 100% gives him the best chance I think to try to get this wound healed without progressing to further intervention such as amputation. Patient does have [name] history of diabetes mellitus type 2, hypertension, atrial fibrillation, [name]-term use of anticoagulant therapy, and he does have [name] cardiac pacemaker. I did review notes through epic. He has had [name] recent EKG which did not appear to be significantly different from an EKG even about [name] month ago which was good news. This EKG was performed 12-17-2022 and the when it was compared to was 11-19-2022 and 10-25-2022. Subsequently the patient also has undergone an arterial lower extremity duplex which showed on 12-15-2022 that there was no significant plaque formation visualized within the arteries of the right lower extremity. It would appear that he has sufficient arterial flow to heal although he does have further follow-up with vascular as well. This is mainly in regard to his left lower  extremity. He did have an MRI on December 10, 2022 which shows that he does have osteomyelitis which was stated to be acute at that point of the middle phalanx of the second toe right foot. Again he has been on antibiotics since November 19, 2022. 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 I did review the patient's CBC and currently white blood cell count was 9.0 hemoglobin 12.6 hematocrit 37.9 otherwise everything was within normal limits. I did also review the basic metabolic panel which showed that the patient does have elevated creatinine with [name] normal BUN. Estimated GFR was 44. Upon reviewing notes on 12-17-2022 also came across the note where the patient has had an echo on March 05, 2022 which did demonstrated left ventricular ejection fraction estimated to be 60 to 65% with normal  function of the left ventricle and no regional wall abnormalities. There was just mild ventricular hypertrophy. Patient's most recent hemoglobin A1c was November 04, 2022 and was at 9.0. Patient History Information obtained from Patient. Allergies No Known Allergies Social History Never smoker, Marital Status - Single, Alcohol Use - Rarely, Drug Use - No History, Caffeine Use - Moderate - coffee. [name], [name] ([id number]) [id number] Page 9 of 13 Medical History Cardiovascular Patient has history of Hypertension Endocrine Patient has history of Type II Diabetes Patient is treated with Oral Agents. Medical [name] Surgical History Notes nd Cardiovascular Afib Review of Systems (ROS) Constitutional Symptoms (General Health) Denies complaints or symptoms of Fatigue, Fever, Chills, Marked Weight Change. Eyes Complains or has symptoms of Glasses / Contacts. Ear/Nose/Mouth/Throat Denies complaints or symptoms of Difficult clearing ears, Sinusitis. Hematologic/Lymphatic Denies complaints or symptoms of Bleeding / Clotting Disorders,  Human Immunodeficiency Virus. Respiratory Denies complaints or symptoms of Chronic or frequent coughs, Shortness of Breath. Genitourinary Denies complaints or symptoms of Kidney failure/ Dialysis, Incontinence/dribbling. Immunological Denies complaints or symptoms of Hives, Itching. Integumentary (Skin) Denies complaints or symptoms of Wounds, Bleeding or bruising tendency, Breakdown, Swelling. Musculoskeletal Denies complaints or symptoms of Muscle Pain, Muscle Weakness. Neurologic Denies complaints or symptoms of Numbness/parasthesias, Focal/Weakness. Psychiatric Denies complaints or symptoms of Anxiety, Claustrophobia. Objective Constitutional patient is hypertensive.. pulse regular and within target range for patient.Marland Kitchen respirations regular, non-labored and within target range for patient.Marland Kitchen temperature within target range for patient.. Well-nourished and well-hydrated in no acute distress. Vitals Time Taken: 2:10 PM, Height: 69 in, Source: Stated, Weight: 210 lbs, BMI: 31, Temperature: 98.2 F, Pulse: 72 bpm, Respiratory Rate: 18 breaths/min, Blood Pressure: 158/88 mmHg. Eyes conjunctiva clear no eyelid edema noted. pupils equal round and reactive to light and accommodation. Ears, Nose, Mouth, and Throat no gross abnormality of ear auricles or external auditory canals. normal hearing noted during conversation. mucus membranes moist. Respiratory normal breathing without difficulty. Cardiovascular 1+ dorsalis pedis/posterior tibialis pulses. no clubbing, cyanosis, significant edema, Musculoskeletal normal gait and posture. no significant deformity or arthritic changes, no loss or range of motion, no clubbing. Psychiatric this patient is able to make decisions and demonstrates good insight into disease process. Alert and Oriented x 3. pleasant and cooperative. General Notes: Upon evaluation today patient appears to be doing okay in regard to his wound there is there is some need for  sharp debridement here at this point. I discussed that with the patient and I explained how this is going to help the wound heal getting the necrotic tissue away and allowing for good tissue to fill in. He voiced understanding. Also think he is [name] possibility of [name] good candidate for [name] skin substitute as well although we are gena work on getting the HBO approved and we will subsequently start it next week likely began looking towards how things are improving or not with standard wound care measures. Therefore not seeing any improvement with the collagen that I think that [name] skin substitute would be an appropriate way to go next. Integumentary (Hair, Skin) Wound #1 status is Open. Original cause of wound was Gradually Appeared. The date acquired was: 11/27/2022. The wound is located on the Right,Anterior Toe Second. The wound measures 2cm length x 1.2cm width x 0.2cm depth; 1.885cm^2 area and 0.377cm^3 volume. There is Fat Layer (Subcutaneous Tissue) exposed. There is [name] medium amount of serosanguineous drainage noted. There is small (1-33%) pink granulation within the wound bed. There is [name] medium (34- 66%) amount of  necrotic tissue within the wound bed including Eschar and Adherent Slough. Assessment [name], [name] ([id number]) [id number] Page 10 of 13 [name] ctive Problems ICD-10 Type 2 diabetes mellitus with foot ulcer Non-pressure chronic ulcer of other part of right foot with fat layer exposed Essential (primary) hypertension Paroxysmal atrial fibrillation [name] term (current) use of anticoagulants Presence of cardiac pacemaker Procedures Wound #1 Pre-procedure diagnosis of Wound #1 is [name] Diabetic Wound/Ulcer of the Lower Extremity located on the Right,Anterior T Second .Severity of Tissue Pre oe Debridement is: Fat layer exposed. There was [name] Excisional Skin/Subcutaneous Tissue/Muscle Debridement with [name] total area of 1.88 sq cm performed by Allen Derry, PA-C. With the  following instrument(s): Curette to remove Viable and Non-Viable tissue/material. Material removed includes Muscle, Eschar, Subcutaneous Tissue, and Slough after achieving pain control using Lidocaine 4% T opical Solution. No specimens were taken. [name] time out was conducted at 15:09, prior to the start of the procedure. [name] Minimum amount of bleeding was controlled with Pressure. The procedure was tolerated well with [name] pain level of 0 throughout and [name] pain level of 0 following the procedure. Post Debridement Measurements: 2cm length x 1.2cm width x 0.2cm depth; 0.377cm^3 volume. Character of Wound/Ulcer Post Debridement is stable. Severity of Tissue Post Debridement is: Necrosis of muscle. Post procedure Diagnosis Wound #1: Same as Pre-Procedure Plan Follow-up Appointments: Return Appointment in 1 week. 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 Finger stick Blood Glucose Pre- and Post- HBOT Treatment. Follow Hyperbaric Oxygen Glycemia Protocol WOUND #1: - T Second Wound Laterality: Right, Anterior oe Cleanser: Byram Ancillary Kit - 15 Day Supply (DME) (Generic) Every Other Day/30 Days Discharge Instructions: Use supplies as instructed; Kit contains: (15) Saline Bullets; (15) 3x3 Gauze; 15 pr Gloves Cleanser: Soap and Water Every Other  Day/30 Days Discharge Instructions: Gently cleanse wound with antibacterial soap, rinse and pat dry prior to dressing wounds Cleanser: Vashe 5.8 (oz) Every Other Day/30 Days Discharge Instructions: Use vashe 5.8 (oz) as directed Prim Dressing: Prisma 4.34 (in) (DME) (Generic) Every Other Day/30 Days ary Discharge Instructions: Moisten w/normal saline or sterile water; Cover wound as directed. Do not remove from wound bed. Secondary Dressing: ABD Pad 5x9 (in/in) (DME) (Generic) Every Other Day/30 Days Discharge Instructions: Cover with ABD pad Secured With: Medipore T - 70M Medipore H Soft Cloth Surgical T ape ape, 2x2 (in/yd) (DME) (Generic) Every Other Day/30 Days Secured With: Kerlix Roll Sterile or Non-Sterile 6-ply 4.5x4 (yd/yd) (DME) (Generic) Every Other Day/30 Days Discharge Instructions: Apply Kerlix as directed 1. Based on what I am seeing currently on medical and initiate treatment with [name] silver collagen dressing I think this can be [name] good way to go. 2. Also can recommend an ABD pad cut to make [name] small pad over top and secured with rolled gauze and stretch net. 3. And also can recommend the patient should continue to utilize the antibiotics as prescribed by  his primary care provider. Number form also going to suggest that the patient should continue to monitor for any signs of worsening infection if anything changes he knows contact the office and let me know. 4. I am also going to see about approval as far as HBO therapy is concerned. I do believe that the patient would benefit from HBO therapy and I think everything has been done in an excellent fashion by his primary care provider leading up to this point I think that the ideal thing would be for [place] to try to see about concurrently getting the HBO started along with the antibiotics that he is currently on to try to give him the best chance to get this wound to heal and prevent amputation. He voiced understanding he is in agreement  with that plan. We will see patient back for reevaluation in 1 week here in the clinic. If anything worsens or changes patient will contact our office for additional recommendations. Electronic Signature(s) Signed: 12/27/2022 4:14:05 PM By: Allen Derry PA-C Entered By: Allen Derry on 12/27/2022 13:14:05 [name] ([id number]) [id number] Page 11 of 13 -------------------------------------------------------------------------------- ROS/PFSH Details Patient Name: Date of Service: Brian Long, DO NA LD [name]. 12/27/2022 2:00 PM Medical Record Number: [id number] Patient Account Number: [id number] Date of Birth/Sex: Treating RN: August 08, 1955 (67 y.o. [name] Primary Care Provider: Sumner Boast Other Clinician: Referring Provider: Treating Provider/Extender: Milton Ferguson Weeks in Treatment: 0 Information Obtained From Patient Constitutional Symptoms (General Health) Complaints and Symptoms: Negative for: Fatigue; Fever; Chills; Marked Weight Change Eyes Complaints and Symptoms: Positive for: Glasses / Contacts Ear/Nose/Mouth/Throat Complaints and Symptoms: Negative for: Difficult clearing ears; Sinusitis Hematologic/Lymphatic Complaints and Symptoms: Negative for: Bleeding / Clotting Disorders; Human Immunodeficiency Virus Respiratory Complaints and Symptoms: Negative for: Chronic or frequent coughs; Shortness of Breath Genitourinary Complaints and Symptoms: Negative for: Kidney failure/ Dialysis; Incontinence/dribbling Immunological Complaints and Symptoms: Negative for: Hives; Itching Integumentary (Skin) Complaints and Symptoms: Negative for: Wounds; Bleeding or bruising tendency; Breakdown; Swelling Musculoskeletal Complaints and Symptoms: Negative for: Muscle Pain; Muscle Weakness Neurologic Complaints and Symptoms: Negative for: Numbness/parasthesias; Focal/Weakness Psychiatric Complaints and  Symptoms: Negative for: Anxiety; [name], [name] ([id number]) [id number] Page 12 of 13 Cardiovascular Medical History: Positive for: Hypertension Past Medical History Notes: Afib Endocrine Medical History: Positive for: Type II Diabetes Treated with: Oral agents Oncologic Immunizations Pneumococcal Vaccine: Received Pneumococcal Vaccination: No Implantable Devices Yes Family and Social History Never smoker; Marital Status - Single; Alcohol Use: Rarely; Drug Use: No History; Caffeine Use: Moderate - coffee Social Determinants of Health (SDOH) 1. In the past 2 months, did you or others you live with eat smaller meals or skip meals because you didn't have money for foodo : No 2. Are you homeless or worried that you might be in the futureo : No 3. Do you have trouble paying for your utilities (gas, electricity, phone)o : No 4. Do you have trouble finding or paying for [name] rideo : No 5. Do you need daycare, or better daycare, for your kidso : No 6. Are you unemployed or without regular incomeo : Yes 7. Do you need help finding [name] better jobo : Yes 8. Do you need help getting more educationo : No 9. Are you concerned about someone in your home using drugs or alcoholo : No 10. Do you feel unsafe in your daily lifeo : No 11. Is anyone in your home threatening or abusing youo : No 12. Do you lack quality relationships that  make you feel valued and supportedo : No 13. Do you need help getting cultural information in [name] language you understando : No 14. Do you need help getting internet accesso : Yes Advanced Directives and Instructions Spiritual or Cultural beliefs preclude asking about Advance Care Planning: No Advanced Directives: No Patient wants information on Advanced Directives: No Do not resuscitate: No Living Will: No Medical Power of Attorney: No Surrogate Decision Maker: No Electronic Signature(s) Signed: 12/27/2022 4:20:01 PM By:  Allen Derry PA-C Signed: 12/28/2022 4:17:14 PM By: Midge Aver MSN RN CNS WTA Entered By: Midge Aver on 12/27/2022 11:18:57 -------------------------------------------------------------------------------- SuperBill Details Patient Name: Date of Service: Brian Long, DO NA LD [name]. 12/27/2022 Medical Record Number: [id number] Patient Account Number: [id number] Date of Birth/Sex: Treating RN: [birth year] (67 y.o. [name], [place], [place] [name] ([id number]) [id number] Page 13 of 13 Primary Care Provider: Sumner Boast Other Clinician: Referring Provider: Treating Provider/Extender: Milton Ferguson Weeks in Treatment: 0 Diagnosis Coding ICD-10 Codes Code Description E11.621 Type 2 diabetes mellitus with foot ulcer L97.512 Non-pressure chronic ulcer of other part of right foot with fat layer exposed I10 Essential (primary) hypertension I48.0 Paroxysmal atrial fibrillation Z79.01 [name] term (current) use of anticoagulants Z95.0 Presence of cardiac pacemaker Facility Procedures : CPT4 Code: 86578469 Description: 99213 - WOUND CARE VISIT-LEV 3 EST PT Modifier: Quantity: 1 : CPT4 Code: 62952841 Description: 11043 - DEB MUSC/FASCIA 20 SQ CM/< ICD-10 Diagnosis Description L97.512 Non-pressure chronic ulcer of other part of right foot with fat layer exposed Modifier: Quantity: 1 Physician Procedures : CPT4 Code Description Modifier [id number] 99204 - WC PHYS LEVEL 4 - NEW PT 25 ICD-10 Diagnosis Description E11.621 Type 2 diabetes mellitus with foot ulcer L97.512 Non-pressure chronic ulcer of other part of right foot with fat layer exposed I10 Essential  (primary) hypertension I48.0 Paroxysmal atrial fibrillation Quantity: 1 : [id number] 11043 - WC PHYS DEBR MUSCLE/FASCIA 20 SQ CM ICD-10 Diagnosis Description L97.512 Non-pressure chronic ulcer of other part of right foot with fat layer exposed Quantity: 1 Electronic Signature(s) Signed:  12/27/2022 4:16:26 PM By: Midge Aver MSN RN CNS WTA Signed: 12/27/2022 4:20:01 PM By: Allen Derry PA-C Previous Signature: 12/27/2022 4:14:21 PM Version By: Allen Derry PA-C Entered By: Midge Aver on 12/27/2022 13:16:26

## 2022-12-30 ENCOUNTER — Other Ambulatory Visit
Admission: RE | Admit: 2022-12-30 | Discharge: 2022-12-30 | Disposition: A | Discharge status: Home / Self Care | Payer: Medicare HMO | Attending: Physician Assistant | Admitting: Physician Assistant

## 2022-12-30 DIAGNOSIS — E11621 Type 2 diabetes mellitus with foot ulcer: Secondary | ICD-10-CM | POA: Insufficient documentation

## 2022-12-30 DIAGNOSIS — M86471 Chronic osteomyelitis with draining sinus, right ankle and foot: Secondary | ICD-10-CM | POA: Diagnosis not present

## 2022-12-30 LAB — COMPREHENSIVE METABOLIC PANEL
ALT: 18 U/L (ref 0–44)
AST: 18 U/L (ref 15–41)
Albumin: 4.1 g/dL (ref 3.5–5.0)
Alkaline Phosphatase: 66 U/L (ref 38–126)
Anion gap: 9 (ref 5–15)
BUN: 23 mg/dL (ref 8–23)
CO2: 25 mmol/L (ref 22–32)
Calcium: 9.3 mg/dL (ref 8.9–10.3)
Chloride: 105 mmol/L (ref 98–111)
Creatinine, Ser: 1.76 mg/dL — ABNORMAL HIGH (ref 0.61–1.24)
GFR, Estimated: 42 mL/min — ABNORMAL LOW (ref >60)
Glucose, Bld: 176 mg/dL — ABNORMAL HIGH (ref 70–99)
Potassium: 4.6 mmol/L (ref 3.5–5.1)
Sodium: 139 mmol/L (ref 135–145)
Total Bilirubin: 1 mg/dL (ref <1.2)
Total Protein: 7.2 g/dL (ref 6.5–8.1)

## 2022-12-30 LAB — PREALBUMIN: Prealbumin: 31 mg/dL (ref 18–38)

## 2022-12-31 ENCOUNTER — Ambulatory Visit
Admission: RE | Admit: 2022-12-31 | Discharge: 2022-12-31 | Disposition: A | Discharge status: Home / Self Care | Payer: Medicare HMO | Source: Ambulatory Visit | Attending: Internal Medicine | Admitting: Internal Medicine

## 2022-12-31 DIAGNOSIS — E11621 Type 2 diabetes mellitus with foot ulcer: Secondary | ICD-10-CM | POA: Insufficient documentation

## 2022-12-31 DIAGNOSIS — L97509 Non-pressure chronic ulcer of other part of unspecified foot with unspecified severity: Secondary | ICD-10-CM | POA: Insufficient documentation

## 2022-12-31 DIAGNOSIS — M869 Osteomyelitis, unspecified: Secondary | ICD-10-CM | POA: Insufficient documentation

## 2022-12-31 DIAGNOSIS — I739 Peripheral vascular disease, unspecified: Secondary | ICD-10-CM | POA: Diagnosis present

## 2022-12-31 LAB — POCT I-STAT, CHEM 8
BUN: 26 mg/dL — ABNORMAL HIGH (ref 8–23)
Calcium, Ion: 1.21 mmol/L (ref 1.15–1.40)
Chloride: 109 mmol/L (ref 98–111)
Creatinine, Ser: 1.7 mg/dL — ABNORMAL HIGH (ref 0.61–1.24)
Glucose, Bld: 155 mg/dL — ABNORMAL HIGH (ref 70–99)
HCT: 41 % (ref 39.0–52.0)
Hemoglobin: 13.9 g/dL (ref 13.0–17.0)
Potassium: 4.3 mmol/L (ref 3.5–5.1)
Sodium: 143 mmol/L (ref 135–145)
TCO2: 23 mmol/L (ref 22–32)

## 2022-12-31 MED ORDER — IOHEXOL 350 MG/ML SOLN
80.0000 mL | Freq: Once | INTRAVENOUS | Status: AC | PRN
  Administered 2022-12-31: 80 mL via INTRAVENOUS

## 2023-01-03 ENCOUNTER — Encounter: Payer: Medicare HMO | Admitting: Physician Assistant

## 2023-01-03 DIAGNOSIS — E11621 Type 2 diabetes mellitus with foot ulcer: Secondary | ICD-10-CM | POA: Diagnosis not present

## 2023-01-03 LAB — GLUCOSE, CAPILLARY
Glucose-Capillary: 220 mg/dL — ABNORMAL HIGH (ref 70–99)
Glucose-Capillary: 182 mg/dL — ABNORMAL HIGH (ref 70–99)

## 2023-01-04 ENCOUNTER — Encounter: Payer: Medicare HMO | Admitting: Physician Assistant

## 2023-01-04 DIAGNOSIS — E11621 Type 2 diabetes mellitus with foot ulcer: Secondary | ICD-10-CM | POA: Diagnosis not present

## 2023-01-04 LAB — GLUCOSE, CAPILLARY
Glucose-Capillary: 190 mg/dL — ABNORMAL HIGH (ref 70–99)
Glucose-Capillary: 170 mg/dL — ABNORMAL HIGH (ref 70–99)

## 2023-01-04 NOTE — Progress Notes (Signed)
[name], [name] ([id number]) [id number] Page 1 of 2 Visit Report for 01/03/2023 HBO Details Patient Name: Date of Service: Brian Long, DO [place] LD [name]. 01/03/2023 7:30 [name] Medical Record Number: [id number] Patient Account Number: [id number] Date of Birth/Sex: Treating RN: Nov 06, 1955 (67 y.o. M) Primary Care [name]: [name] Other Clinician: Demetria [name] Referring [name]: Treating [name] in Treatment: 1 HBO Treatment Course Details Treatment Course Number: 1 Ordering [name]: [name] T Treatments Ordered: otal 40 HBO Treatment Start Date: 01/03/2023 HBO Indication: Other (specify in Notes) Right 2nd T Ulcer, wagoner grade 3 oe Notes: by MRI HBO Treatment Details Treatment Number: 1 Patient Type: Outpatient Chamber Type: Monoplace Chamber Serial #: [id number] Treatment Protocol: 2.0 ATA with 90 minutes oxygen, and no air breaks Treatment Details Compression Rate Down: 2.0 psi / minute De-Compression Rate Up: 1.0 psi / minute Air breaks and breathing Decompress Decompress Compress Tx Pressure Begins Reached periods Begins Ends (leave unused spaces blank) Chamber Pressure (ATA 1 2 ------2 1 ) Clock Time (24 hr) 8:16 8:31 - - - - - - 10:02 10:15 Treatment Length: 119 (minutes) Treatment Segments: 4 Vital Signs Capillary Blood Glucose Reference Range: 80 - 120 mg / dl HBO Diabetic Blood Glucose Intervention Range: <131 mg/dl or >782 mg/dl Time Vitals Blood Respiratory Capillary Blood Glucose Pulse Action Type: Pulse: Temperature: Taken: Pressure: Rate: Glucose (mg/dl): Meter #: Oximetry (%) Taken: Pre 07:30 134/80 50 18 98 220 99 Post 10:16 132/80 56 18 98 182 1 98 Treatment Response Treatment Toleration: Well Treatment Completion Status: Treatment Completed without Adverse Event Treatment Notes Patient complained of ear pressure. [name] notified Electronic  Signature(s) Signed: 01/03/2023 5:11:14 PM By: Brian Derry PA-C Signed: 01/04/2023 12:24:32 PM By: [name] Entered By: [name] on 01/03/2023 08:00:45 [name] ([id number]) [id number] Page 2 of 2 -------------------------------------------------------------------------------- HBO Safety Checklist Details Patient Name: Date of Service: Brian Long, DO [place] LD [name]. 01/03/2023 7:30 [name] Medical Record Number: [id number] Patient Account Number: [id number] Date of Birth/Sex: Treating RN: [birth year] (67 y.o. M) Primary Care [name]: [name] Other Clinician: Demetria [name] Referring [name]: Treating [name]/Extender: [name] Weeks in Treatment: 1 HBO Safety Checklist Items Safety Checklist Consent Form Signed Patient voided / foley secured and emptied When did you last eato 01/03/23 Last dose of injectable or oral agent 01/03/23 Ostomy pouch emptied and vented if applicable NA All implantable devices assessed, documented and approved NA Intravenous access site secured and place NA Valuables secured Linens and cotton and cotton/polyester blend (less than 51% polyester) Personal oil-based products / skin lotions / body lotions removed Wigs or hairpieces removed NA Smoking or tobacco materials removed NA Books / newspapers / magazines / loose paper removed Cologne, aftershave, perfume and deodorant removed Jewelry removed (may wrap wedding band) Make-up removed NA Hair care products removed Battery operated devices (external) removed NA Heating patches and chemical warmers removed NA Titanium eyewear removed NA Nail polish cured greater than 10 hours NA Casting material cured greater than 10 hours NA Hearing aids removed NA Loose dentures or partials removed NA Prosthetics have been removed NA Patient demonstrates correct use of air break device (if applicable) Patient concerns have been  addressed Patient grounding bracelet on and cord attached to chamber Specifics for Inpatients (complete in addition to above) Medication sheet sent with patient NA Intravenous medications needed or due during therapy sent with patient NA Drainage tubes (e.g. nasogastric tube or chest tube secured and vented) NA Endotracheal  or Tracheotomy tube secured NA Cuff deflated of air and inflated with saline NA Airway suctioned NA Electronic Signature(s) Signed: 01/04/2023 12:24:32 PM By: [name] Entered By: [name] on 01/03/2023 07:57:45

## 2023-01-04 NOTE — Progress Notes (Signed)
XAVI, BRICH A ([id number]) [id number] Page 1 of 2 Visit Report for 01/04/2023 HBO Details Patient Name: Date of Service: Arnold Long, [name] 01/04/2023 7:30 A M Medical Record Number: [id number] Patient Account Number: [id number] Date of Birth/Sex: Treating RN: 27-May-1955 (67 y.o. M) Primary Care Azariah Bonura: Sumner Boast Other Clinician: Demetria Pore Referring Davan Hark: Treating Cordell Guercio/Extender: Renee Rival in Treatment: 1 HBO Treatment Course Details Treatment Course Number: 1 Ordering [place]: Allen Derry T Treatments Ordered: otal 40 HBO Treatment Start Date: 01/03/2023 HBO Indication: Other (specify in Notes) Right 2nd T Ulcer, wagoner grade 3 oe Notes: by MRI HBO Treatment Details Treatment Number: 2 Patient Type: Outpatient Chamber Type: Monoplace Chamber Serial #: [id number] Treatment Protocol: 2.0 ATA with 90 minutes oxygen, and no air breaks Treatment Details Compression Rate Down: 2.0 psi / minute De-Compression Rate Up: 1.0 psi / minute Air breaks and breathing Decompress Decompress Compress Tx Pressure Begins Reached periods Begins Ends (leave unused spaces blank) Chamber Pressure (ATA 1 2 ------2 1 ) Clock Time (24 hr) 8:06 8:21 - - - - - - 9:51 10:06 Treatment Length: 120 (minutes) Treatment Segments: 4 Vital Signs Capillary Blood Glucose Reference Range: 80 - 120 mg / dl HBO Diabetic Blood Glucose Intervention Range: <131 mg/dl or >324 mg/dl Time Vitals Blood Respiratory Capillary Blood Glucose Pulse Action Type: Pulse: Temperature: Taken: Pressure: Rate: Glucose (mg/dl): Meter #: Oximetry (%) Taken: Pre 08:00 140/80 80 18 98 190 99 Treatment Response Treatment Toleration: Well Treatment Completion Status: Treatment Completed without Adverse Event Electronic Signature(s) Signed: 01/04/2023 12:24:32 PM By: Demetria Pore Signed: 01/04/2023 3:46:23 PM By: Allen Derry PA-C Entered By:  Demetria Pore on 01/04/2023 08:13:46 Pommier, Saqib A ([id number]) [id number] Page 2 of 2 -------------------------------------------------------------------------------- HBO Safety Checklist Details Patient Name: Date of Service: Arnold Long, [name] 01/04/2023 7:30 A M Medical Record Number: [id number] Patient Account Number: [id number] Date of Birth/Sex: Treating RN: 07-24-1955 (67 y.o. M) Primary Care Helio Lack: Sumner Boast Other Clinician: Demetria Pore Referring Panda Crossin: Treating Toni Demo/Extender: Milton Ferguson Weeks in Treatment: 1 HBO Safety Checklist Items Safety Checklist Consent Form Signed Patient voided / foley secured and emptied When did you last eato 01/04/23 Last dose of injectable or oral agent 01/04/23 Ostomy pouch emptied and vented if applicable NA All implantable devices assessed, documented and approved NA Intravenous access site secured and place NA Valuables secured Linens and cotton and cotton/polyester blend (less than 51% polyester) Personal oil-based products / skin lotions / body lotions removed Wigs or hairpieces removed NA Smoking or tobacco materials removed NA Books / newspapers / magazines / loose paper removed Cologne, aftershave, perfume and deodorant removed Jewelry removed (may wrap wedding band) Make-up removed NA Hair care products removed Battery operated devices (external) removed NA Heating patches and chemical warmers removed NA Titanium eyewear removed NA Nail polish cured greater than 10 hours NA Casting material cured greater than 10 hours NA Hearing aids removed NA Loose dentures or partials removed NA Prosthetics have been removed NA Patient demonstrates correct use of air break device (if applicable) Patient concerns have been addressed Patient grounding bracelet on and cord attached to chamber Specifics for Inpatients (complete in addition to above) Medication  sheet sent with patient NA Intravenous medications needed or due during therapy sent with patient NA Drainage tubes (e.g. nasogastric tube or chest tube secured and vented) NA Endotracheal or Tracheotomy tube secured NA Cuff deflated of air and inflated with saline NA Airway suctioned NA Electronic  Signature(s) Signed: 01/04/2023 12:24:32 PM By: Demetria Pore Entered By: Demetria Pore on 01/04/2023 16:10:96

## 2023-01-04 NOTE — Progress Notes (Signed)
CADDEN, DETLOFF A ([id number]) [id number] Page 1 of 2 Visit Report for 01/03/2023 Arrival Information Details Patient Name: Date of Service: Brian Long, [name] 01/03/2023 7:30 A M Medical Record Number: [id number] Patient Account Number: [id number] Date of Birth/Sex: Treating RN: [birth year] (67 y.o. M) Primary Care [name]: [name] Other Clinician: Demetria [name] Referring [name]: Treating [name]/Extender: [name] in Treatment: 1 Visit Information History Since Last Visit Added or deleted any medications: No Patient Arrived: Ambulatory Any new allergies or adverse reactions: No Arrival Time: 07:30 Had a fall or experienced change in No Accompanied By: self activities of daily living that may affect Transfer Assistance: None risk of falls: Patient Identification Verified: Yes Signs or symptoms of abuse/neglect since last visito No Secondary Verification Process Completed: Yes Hospitalized since last visit: No Patient Requires Transmission-Based Precautions: No Implantable device outside of the clinic excluding No Patient Has Alerts: Yes cellular tissue based products placed in the center Patient Alerts: Patient on Blood Thinner since last visit: Diabetic Type 2 Pain Present Now: No Eliquis Electronic Signature(s) Signed: 01/04/2023 12:24:32 PM By: [name] Entered By: [name] on 01/03/2023 07:57:39 -------------------------------------------------------------------------------- Encounter Discharge Information Details Patient Name: Date of Service: Brian Long, DO NA LD A. 01/03/2023 7:30 A M Medical Record Number: [id number] Patient Account Number: [id number] Date of Birth/Sex: Treating RN: [birth year] (67 y.o. M) Primary Care [name]: [name] Other Clinician: Demetria [name] Referring Swayze Pries: Treating [name]/Extender: [name] in Treatment: 1 Encounter  Discharge Information Items Discharge Condition: Stable Ambulatory Status: Ambulatory Discharge Destination: Home Transportation: Private Auto Accompanied By: self Schedule Follow-up Appointment: Yes Clinical Summary of Care: Brian, [name] ([id number]) [phone] Page 2 of 2 Electronic Signature(s) Signed: 01/04/2023 12:24:32 PM By: [name] Entered By: [name] on 01/03/2023 08:01:13 -------------------------------------------------------------------------------- Vitals Details Patient Name: Date of Service: Brian Long, DO NA LD A. 01/03/2023 7:30 A M Medical Record Number: [id number] Patient Account Number: [id number] Date of Birth/Sex: Treating RN: [birth year] (67 y.o. M) Primary Care Adilynn Bessey: [name] Other Clinician: Demetria [name] Referring [name]: Treating [name]/Extender: [name] in Treatment: 1 Vital Signs Time Taken: 07:30 Temperature (F): 98.0 Height (in): 69 Pulse (bpm): 50 Weight (lbs): 210 Respiratory Rate (breaths/min): 18 Body Mass Index (BMI): 31 Blood Pressure (mmHg): 134/80 Capillary Blood Glucose (mg/dl): 109 Reference Range: 80 - 120 mg / dl Airway Pulse Oximetry (%): 99 Electronic Signature(s) Signed: 01/04/2023 12:24:32 PM By: [name] Entered By: [name] on 01/03/2023 07:57:42

## 2023-01-04 NOTE — Progress Notes (Signed)
DELRON, TAIWO A ([id number]) [id number] Page 1 of 2 Visit Report for 01/04/2023 Arrival Information Details Patient Name: Date of Service: Arnold Long, [name] 01/04/2023 7:30 A M Medical Record Number: [id number] Patient Account Number: [id number] Date of Birth/Sex: Treating RN: May 27, 1955 (67 y.o. M) Primary Care Mckaylee Dimalanta: Sumner Boast Other Clinician: Demetria Pore Referring Kalan Rinn: Treating Jimy Gates/Extender: Renee Rival in Treatment: 1 Visit Information History Since Last Visit Added or deleted any medications: No Patient Arrived: Ambulatory Any new allergies or adverse reactions: No Arrival Time: 07:30 Had a fall or experienced change in No Accompanied By: self activities of daily living that may affect Transfer Assistance: None risk of falls: Patient Identification Verified: Yes Signs or symptoms of abuse/neglect since last visito No Secondary Verification Process Completed: Yes Hospitalized since last visit: No Patient Requires Transmission-Based Precautions: No Implantable device outside of the clinic excluding No Patient Has Alerts: Yes cellular tissue based products placed in the center Patient Alerts: Patient on Blood Thinner since last visit: Diabetic Type 2 Pain Present Now: No Eliquis Electronic Signature(s) Signed: 01/04/2023 12:24:32 PM By: Demetria Pore Entered By: Demetria Pore on 01/04/2023 05:26:43 -------------------------------------------------------------------------------- Encounter Discharge Information Details Patient Name: Date of Service: Arnold Long, DO NA LD A. 01/04/2023 7:30 A M Medical Record Number: [id number] Patient Account Number: [id number] Date of Birth/Sex: Treating RN: March 29, 1955 (67 y.o. M) Primary Care Shadd Dunstan: Sumner Boast Other Clinician: Demetria Pore Referring Ryah Cribb: Treating Kaysi Ourada/Extender: Renee Rival in Treatment: 1 Encounter  Discharge Information Items Discharge Condition: Stable Ambulatory Status: Ambulatory Discharge Destination: Home Transportation: Private Auto Accompanied By: self Schedule Follow-up Appointment: Yes Clinical Summary of Care: ELVIRA, BRUNDRETT ([id number]) [id number] Page 2 of 2 Electronic Signature(s) Signed: 01/04/2023 12:24:32 PM By: Demetria Pore Entered By: Demetria Pore on 01/04/2023 08:14:11 -------------------------------------------------------------------------------- Vitals Details Patient Name: Date of Service: Arnold Long, DO NA LD A. 01/04/2023 7:30 A M Medical Record Number: [id number] Patient Account Number: [id number] Date of Birth/Sex: Treating RN: 06-30-1955 (67 y.o. M) Primary Care Earnie Bechard: Sumner Boast Other Clinician: Demetria Pore Referring Mashayla Lavin: Treating Kile Kabler/Extender: Milton Ferguson Weeks in Treatment: 1 Vital Signs Time Taken: 08:00 Temperature (F): 98.0 Height (in): 69 Pulse (bpm): 80 Weight (lbs): 210 Respiratory Rate (breaths/min): 18 Body Mass Index (BMI): 31 Blood Pressure (mmHg): 140/80 Capillary Blood Glucose (mg/dl): 518 Reference Range: 80 - 120 mg / dl Airway Pulse Oximetry (%): 99 Electronic Signature(s) Signed: 01/04/2023 12:24:32 PM By: Demetria Pore Entered By: Demetria Pore on 01/04/2023 05:27:25

## 2023-01-06 ENCOUNTER — Ambulatory Visit: Payer: Medicare HMO | Admitting: Internal Medicine

## 2023-01-07 ENCOUNTER — Encounter: Payer: Medicare HMO | Admitting: Internal Medicine

## 2023-01-07 DIAGNOSIS — E11621 Type 2 diabetes mellitus with foot ulcer: Secondary | ICD-10-CM | POA: Diagnosis not present

## 2023-01-07 LAB — GLUCOSE, CAPILLARY
Glucose-Capillary: 187 mg/dL — ABNORMAL HIGH (ref 70–99)
Glucose-Capillary: 193 mg/dL — ABNORMAL HIGH (ref 70–99)

## 2023-01-07 NOTE — Progress Notes (Signed)
PINO, DUECK A ([id number]) [id number] Page 1 of 9 Visit Report for 01/07/2023 Arrival Information Details Patient Name: Date of Service: Brian Long, [name] 01/07/2023 7:30 A M Medical Record Number: [id number] Patient Account Number: [id number] Date of Birth/Sex: Treating RN: 09/24/1955 (67 y.o. [name] Primary Care Zakariah Urwin: Sumner Boast Other Clinician: Referring Tray Klayman: Treating Skylynn Burkley/Extender: RO BSO N, MICHA EL G Entzminger, Celene Squibb in Treatment: 1 Visit Information History Since Last Visit Added or deleted any medications: No Patient Arrived: Ambulatory Any new allergies or adverse reactions: No Arrival Time: 07:41 Had a fall or experienced change in No Accompanied By: self activities of daily living that may affect Transfer Assistance: None risk of falls: Patient Identification Verified: Yes Hospitalized since last visit: No Secondary Verification Process Completed: Yes Has Dressing in Place as Prescribed: Yes Patient Requires Transmission-Based Precautions: No Pain Present Now: No Patient Has Alerts: Yes Patient Alerts: Patient on Blood Thinner Diabetic Type 2 Eliquis Electronic Signature(s) Signed: 01/07/2023 12:38:04 PM By: Angelina Pih Entered By: Angelina Pih on 01/07/2023 04:41:49 -------------------------------------------------------------------------------- Clinic Level of Care Assessment Details Patient Name: Date of Service: Brian Long, DO NA LD A. 01/07/2023 7:30 A M Medical Record Number: [id number] Patient Account Number: [id number] Date of Birth/Sex: Treating RN: February 10, 1955 (67 y.o. [name] Primary Care Myesha Stillion: Sumner Boast Other Clinician: Referring Adelle Zachar: Treating Arely Tinner/Extender: RO BSO N, MICHA EL G Entzminger, Celene Squibb in Treatment: 1 Clinic Level of Care Assessment Items TOOL 1 Quantity Score []  - 0 Use when EandM and Procedure is performed on  INITIAL visit ASSESSMENTS - Nursing Assessment / Reassessment []  - 0 General Physical Exam (combine w/ comprehensive assessment (listed just below) when performed on new pt. evals) []  - 0 Comprehensive Assessment (HX, ROS, Risk Assessments, Wounds Hx, etc.) ASSESSMENTS - Wound and Skin Assessment / Reassessment []  - 0 Dermatologic / Skin Assessment (not related to wound area) JARULE, SAMPATH A ([id number]) [id number] Page 2 of 9 ASSESSMENTS - Ostomy and/or Continence Assessment and Care []  - 0 Incontinence Assessment and Management []  - 0 Ostomy Care Assessment and Management (repouching, etc.) PROCESS - Coordination of Care []  - 0 Simple Patient / Family Education for ongoing care []  - 0 Complex (extensive) Patient / Family Education for ongoing care []  - 0 Staff obtains Chiropractor, Records, T Results / Process Orders est []  - 0 Staff telephones HHA, Nursing Homes / Clarify orders / etc []  - 0 Routine Transfer to another Facility (non-emergent condition) []  - 0 Routine Hospital Admission (non-emergent condition) []  - 0 New Admissions / Manufacturing engineer / Ordering NPWT Apligraf, etc. , []  - 0 Emergency Hospital Admission (emergent condition) PROCESS - Special Needs []  - 0 Pediatric / Minor Patient Management []  - 0 Isolation Patient Management []  - 0 Hearing / Language / Visual special needs []  - 0 Assessment of Community assistance (transportation, D/C planning, etc.) []  - 0 Additional assistance / Altered mentation []  - 0 Support Surface(s) Assessment (bed, cushion, seat, etc.) INTERVENTIONS - Miscellaneous []  - 0 External ear exam []  - 0 Patient Transfer (multiple staff / Nurse, adult / Similar devices) []  - 0 Simple Staple / Suture removal (25 or less) []  - 0 Complex Staple / Suture removal (26 or more) []  - 0 Hypo/Hyperglycemic Management (do not check if billed separately) []  - 0 Ankle / Brachial Index (ABI) - do not check  if billed separately Has the patient been seen at the hospital within the last three years: Yes Total Score: 0  Level Of Care: ____ Electronic Signature(s) Signed: 01/07/2023 12:38:04 PM By: Angelina Pih Entered By: Angelina Pih on 01/07/2023 05:08:41 -------------------------------------------------------------------------------- Encounter Discharge Information Details Patient Name: Date of Service: Brian Long, DO NA LD A. 01/07/2023 7:30 A M Medical Record Number: [id number] Patient Account Number: [id number] Date of Birth/Sex: Treating RN: 1955-12-13 (67 y.o. [name] Primary Care Sakinah Rosamond: Sumner Boast Other Clinician: Referring Dwan Hemmelgarn: Treating Rayyan Burley/Extender: RO BSO N, MICHA EL G Entzminger, Celene Squibb in Treatment: 1 Encounter Discharge Information Items Post Procedure JAYSTEN, ROCKMAN A ([id number]) [id number] Page 3 of 9 Discharge Condition: Stable Temperature (F): 97.6 Ambulatory Status: Ambulatory Pulse (bpm): 67 Discharge Destination: Home Respiratory Rate (breaths/min): 18 Transportation: Private Auto Blood Pressure (mmHg): 160/92 Accompanied By: self Schedule Follow-up Appointment: Yes Clinical Summary of Care: Electronic Signature(s) Signed: 01/07/2023 12:38:04 PM By: Angelina Pih Entered By: Angelina Pih on 01/07/2023 05:10:03 -------------------------------------------------------------------------------- Lower Extremity Assessment Details Patient Name: Date of Service: Brian Long, DO NA LD A. 01/07/2023 7:30 A M Medical Record Number: [id number] Patient Account Number: [id number] Date of Birth/Sex: Treating RN: 07-Aug-1955 (67 y.o. [name] Primary Care Makenzie Weisner: Sumner Boast Other Clinician: Referring Addyson Traub: Treating Nekeshia Lenhardt/Extender: RO BSO N, MICHA EL G Entzminger, Celene Squibb in Treatment: 1 Edema Assessment Assessed: [Left: No] [Right: No] Edema: [Left: N]  [Right: o] Vascular Assessment Pulses: Dorsalis Pedis Palpable: [Right:Yes] Extremity colors, hair growth, and conditions: Extremity Color: [Right:Normal] Hair Growth on Extremity: [Right:Yes] Temperature of Extremity: [Right:Warm < 3 seconds] Toe Nail Assessment Left: Right: Thick: No Discolored: No Deformed: No Improper Length and Hygiene: No Electronic Signature(s) Signed: 01/07/2023 12:38:04 PM By: Angelina Pih Entered By: Angelina Pih on 01/07/2023 04:49:55 Silversmith, Margarita A ([id number]) [id number] Page 4 of 9 -------------------------------------------------------------------------------- Multi Wound Chart Details Patient Name: Date of Service: Brian Long, DO NA LD A. 01/07/2023 7:30 A M Medical Record Number: [id number] Patient Account Number: [id number] Date of Birth/Sex: Treating RN: [birth year] (67 y.o. [name] Primary Care Marshawn Ninneman: Sumner Boast Other Clinician: Referring Yakub Lodes: Treating Tameca Jerez/Extender: RO BSO N, MICHA EL G Entzminger, Celene Squibb in Treatment: 1 Vital Signs Height(in): 69 Pulse(bpm): 67 Weight(lbs): 210 Blood Pressure(mmHg): 160/92 Body Mass Index(BMI): 31 Temperature(F): 97.6 Respiratory Rate(breaths/min): 18 [1:Photos:] [N/A:N/A] Right, Anterior T Second oe N/A N/A Wound Location: Gradually Appeared N/A N/A Wounding Event: Diabetic Wound/Ulcer of the Lower N/A N/A Primary Etiology: Extremity Hypertension, Type II Diabetes N/A N/A Comorbid History: 11/27/2022 N/A N/A Date Acquired: 1 N/A N/A Weeks of Treatment: Open N/A N/A Wound Status: No N/A N/A Wound Recurrence: 2x1x0.1 N/A N/A Measurements L x W x D (cm) 1.571 N/A N/A A (cm) : rea 0.157 N/A N/A Volume (cm) : 16.70% N/A N/A % Reduction in A rea: 58.40% N/A N/A % Reduction in Volume: Grade 3 N/A N/A Classification: Medium N/A N/A Exudate A mount: Serosanguineous N/A N/A Exudate Type: red, brown N/A  N/A Exudate Color: Small (1-33%) N/A N/A Granulation A mount: Pink N/A N/A Granulation Quality: Medium (34-66%) N/A N/A Necrotic A mount: Eschar, Adherent Slough N/A N/A Necrotic Tissue: Fat Layer (Subcutaneous Tissue): Yes N/A N/A Exposed Structures: Fascia: No Tendon: No Muscle: No Joint: No Bone: No None N/A N/A Epithelialization: Treatment Notes Electronic Signature(s) Signed: 01/07/2023 12:38:04 PM By: Angelina Pih Entered By: Angelina Pih on 01/07/2023 04:56:18 [name] ([id number]) [id number] Page 5 of 9 -------------------------------------------------------------------------------- Multi-Disciplinary Care Plan Details Patient Name: Date of Service: Brian Long, DO NA LD A. 01/07/2023 7:30 A M Medical Record Number: [id number] Patient Account Number: [id number] Date of  Birth/Sex: Treating RN: 05-Jul-1955 (67 y.o. [name] Primary Care Nautia Lem: Sumner Boast Other Clinician: Referring Tracina Beaumont: Treating Malcomb Gangemi/Extender: RO BSO N, MICHA EL G Entzminger, Celene Squibb in Treatment: 1 Active Inactive Necrotic Tissue Nursing Diagnoses: Impaired tissue integrity related to necrotic/devitalized tissue Knowledge deficit related to management of necrotic/devitalized tissue Goals: Necrotic/devitalized tissue will be minimized in the wound bed Date Initiated: 12/27/2022 Target Resolution Date: 01/27/2023 Goal Status: Active Patient/caregiver will verbalize understanding of reason and process for debridement of necrotic tissue Date Initiated: 12/27/2022 Target Resolution Date: 01/27/2023 Goal Status: Active Interventions: Assess patient pain level pre-, during and post procedure and prior to discharge Provide education on necrotic tissue and debridement process Treatment Activities: Apply topical anesthetic as ordered : 12/27/2022 Excisional debridement : 12/27/2022 Notes: Osteomyelitis Nursing  Diagnoses: Infection: osteomyelitis Knowledge deficit related to disease process and management Goals: Diagnostic evaluation for osteomyelitis completed as ordered Date Initiated: 12/27/2022 Target Resolution Date: 01/11/2023 Goal Status: Active Patient/caregiver will verbalize understanding of disease process and disease management Date Initiated: 12/27/2022 Date Inactivated: 01/07/2023 Target Resolution Date: 01/03/2023 Goal Status: Met Patient's osteomyelitis will resolve Date Initiated: 12/27/2022 Target Resolution Date: 02/27/2023 Goal Status: Active Signs and symptoms for osteomyelitis will be recognized and promptly addressed Date Initiated: 12/27/2022 Target Resolution Date: 01/11/2023 Goal Status: Active Interventions: Provide education on osteomyelitis Treatment Activities: Consult for HBO : 12/27/2022 MRI : 12/17/2022 T ordered outside of clinic : 12/27/2022 est White Blood Cell Scan : 12/17/2022 IKAIA, SCIANDRA ([id number]) [phone] Page 6 of 9 Notes: Wound/Skin Impairment Nursing Diagnoses: Impaired tissue integrity Knowledge deficit related to ulceration/compromised skin integrity Goals: Patient/caregiver will verbalize understanding of skin care regimen Date Initiated: 12/27/2022 Target Resolution Date: 01/27/2023 Goal Status: Active Ulcer/skin breakdown will have a volume reduction of 30% by week 4 Date Initiated: 12/27/2022 Target Resolution Date: 01/27/2023 Goal Status: Active Ulcer/skin breakdown will have a volume reduction of 50% by week 8 Date Initiated: 12/27/2022 Target Resolution Date: 02/27/2023 Goal Status: Active Ulcer/skin breakdown will have a volume reduction of 80% by week 12 Date Initiated: 12/27/2022 Target Resolution Date: 03/27/2023 Goal Status: Active Ulcer/skin breakdown will heal within 14 weeks Date Initiated: 12/27/2022 Target Resolution Date: 04/10/2023 Goal Status: Active Interventions: Assess  patient/caregiver ability to obtain necessary supplies Assess patient/caregiver ability to perform ulcer/skin care regimen upon admission and as needed Assess ulceration(s) every visit Provide education on ulcer and skin care Screen for HBO Treatment Activities: Consult for HBO : 12/27/2022 Referred to DME Yaniel Limbaugh for dressing supplies : 12/27/2022 Skin care regimen initiated : 12/27/2022 Notes: Electronic Signature(s) Signed: 01/07/2023 12:38:04 PM By: Angelina Pih Entered By: Angelina Pih on 01/07/2023 05:09:05 -------------------------------------------------------------------------------- Pain Assessment Details Patient Name: Date of Service: Brian Long, DO NA LD A. 01/07/2023 7:30 A M Medical Record Number: [id number] Patient Account Number: [id number] Date of Birth/Sex: Treating RN: 01-Nov-1955 (67 y.o. [name] Primary Care Geniene List: Sumner Boast Other Clinician: Referring Lonya Johannesen: Treating Adaia Matthies/Extender: RO BSO N, MICHA EL G Entzminger, Celene Squibb in Treatment: 1 Active Problems Location of Pain Severity and Description of Pain Patient Has Paino No Site Locations [place] A ([id number]) [id number] Page 7 of 9 Pain Management and Medication Current Pain Management: Electronic Signature(s) Signed: 01/07/2023 12:38:04 PM By: Angelina Pih Entered By: Angelina Pih on 01/07/2023 04:43:31 -------------------------------------------------------------------------------- Patient/Caregiver Education Details Patient Name: Date of Service: Brian Long, DO NA LD A. 12/27/2024andnbsp7:30 A M Medical Record Number: [id number] Patient Account Number: [id number] Date of Birth/Gender: Treating RN: 20-Feb-1955 (67 y.o. [name] Primary Care  Physician: Sumner Boast Other Clinician: Referring Physician: Treating Physician/Extender: RO BSO N, MICHA EL G Entzminger, Celene Squibb in Treatment: 1 Education  Assessment Education Provided To: Patient Education Topics Provided Wound Debridement: Handouts: Wound Debridement Methods: Explain/Verbal Responses: State content correctly Wound/Skin Impairment: Handouts: Caring for Your Ulcer Methods: Explain/Verbal Responses: State content correctly Electronic Signature(s) Signed: 01/07/2023 12:38:04 PM By: Angelina Pih Entered By: Angelina Pih on 01/07/2023 05:09:20 [name] ([id number]) [id number] Page 8 of 9 -------------------------------------------------------------------------------- Wound Assessment Details Patient Name: Date of Service: Brian Long, DO NA LD A. 01/07/2023 7:30 A M Medical Record Number: [id number] Patient Account Number: [id number] Date of Birth/Sex: Treating RN: 1955/02/25 (67 y.o. [name] Primary Care Cristella Stiver: Sumner Boast Other Clinician: Referring Renea Schoonmaker: Treating Columbia Pandey/Extender: RO BSO N, MICHA EL G Entzminger, Celene Squibb in Treatment: 1 Wound Status Wound Number: 1 Primary Etiology: Diabetic Wound/Ulcer of the Lower Extremity Wound Location: Right, Anterior T Second oe Wound Status: Open Wounding Event: Gradually Appeared Comorbid History: Hypertension, Type II Diabetes Date Acquired: 11/27/2022 Weeks Of Treatment: 1 Clustered Wound: No Photos Wound Measurements Length: (cm) 2 Width: (cm) 1 Depth: (cm) 0.1 Area: (cm) 1.571 Volume: (cm) 0.157 % Reduction in Area: 16.7% % Reduction in Volume: 58.4% Epithelialization: None Tunneling: No Undermining: No Wound Description Classification: Grade 3 Exudate Amount: Medium Exudate Type: Serosanguineous Exudate Color: red, brown Foul Odor After Cleansing: No Slough/Fibrino Yes Wound Bed Granulation Amount: Small (1-33%) Exposed Structure Granulation Quality: Pink Fascia Exposed: No Necrotic Amount: Medium (34-66%) Fat Layer (Subcutaneous Tissue) Exposed: Yes Necrotic Quality: Eschar,  Adherent Slough Tendon Exposed: No Muscle Exposed: No Joint Exposed: No Bone Exposed: No Treatment Notes Wound #1 (Toe Second) Wound Laterality: Right, Anterior Cleanser [employer] Ancillary Kit - 15 Day Supply Discharge Instruction: Use supplies as instructed; Kit contains: (15) Saline Bullets; (15) 3x3 Gauze; 15 pr Gloves Soap and [address] ALTAN, BODLEY A ([id number]) [phone].pdf Page 9 of 9 Discharge Instruction: Gently cleanse wound with antibacterial soap, rinse and pat dry prior to dressing wounds Vashe 5.8 (oz) Discharge Instruction: Use vashe 5.8 (oz) as directed Peri-Wound Care Topical Primary Dressing Prisma 4.34 (in) Discharge Instruction: Moisten w/normal saline or sterile water; Cover wound as directed. Do not remove from wound bed. Secondary Dressing Gauze Discharge Instruction: As directed: dry Secured With Conform 2''- Conforming Stretch Gauze Bandage 2x75 (in/in) Discharge Instruction: Apply as directed Stretch Net Dressing, Latex-free, Size 5, Small-Head / Shoulder / Thigh Discharge Instruction: size 2 used Compression Wrap Compression Stockings Add-Ons Electronic Signature(s) Signed: 01/07/2023 12:38:04 PM By: Angelina Pih Entered By: Angelina Pih on 01/07/2023 04:49:36 -------------------------------------------------------------------------------- Vitals Details Patient Name: Date of Service: Brian Long, DO NA LD A. 01/07/2023 7:30 A M Medical Record Number: [id number] Patient Account Number: [id number] Date of Birth/Sex: Treating RN: May 02, 1955 (67 y.o. [name] Primary Care Nikala Walsworth: Sumner Boast Other Clinician: Referring Jayveon Convey: Treating Cainan Trull/Extender: RO BSO N, MICHA EL G Entzminger, Celene Squibb in Treatment: 1 Vital Signs Time Taken: 07:41 Temperature (F): 97.6 Height (in): 69 Pulse (bpm): 67 Weight (lbs): 210 Respiratory Rate (breaths/min): 18 Body Mass Index (BMI): 31 Blood Pressure  (mmHg): 160/92 Reference Range: 80 - 120 mg / dl Electronic Signature(s) Signed: 01/07/2023 12:38:04 PM By: Angelina Pih Entered By: Angelina Pih on 01/07/2023 04:43:26

## 2023-01-10 ENCOUNTER — Ambulatory Visit: Payer: Medicare HMO | Admitting: Internal Medicine

## 2023-01-10 ENCOUNTER — Encounter: Payer: Medicare HMO | Admitting: Internal Medicine

## 2023-01-10 DIAGNOSIS — E11621 Type 2 diabetes mellitus with foot ulcer: Secondary | ICD-10-CM | POA: Diagnosis not present

## 2023-01-10 LAB — GLUCOSE, CAPILLARY
Glucose-Capillary: 256 mg/dL — ABNORMAL HIGH (ref 70–99)
Glucose-Capillary: 206 mg/dL — ABNORMAL HIGH (ref 70–99)

## 2023-01-10 NOTE — Progress Notes (Signed)
JUDITH, KEMPE A ([id number]) [id number] Page 1 of 2 Visit Report for 01/07/2023 Arrival Information Details Patient Name: Date of Service: Arnold Long, [name] 01/07/2023 8:45 A M Medical Record Number: [id number] Patient Account Number: [id number] Date of Birth/Sex: Treating RN: [birth year] (67 y.o. M) Primary Care Zafirah Vanzee: Sumner Boast Other Clinician: Betha Loa Referring Tayvion Lauder: Treating Dalene Robards/Extender: RO BSO N, MICHA EL G Entzminger, Celene Squibb in Treatment: 1 Visit Information History Since Last Visit Added or deleted any medications: No Patient Arrived: Ambulatory Any new allergies or adverse reactions: No Arrival Time: 08:26 Had a fall or experienced change in No Transfer Assistance: None activities of daily living that may affect Patient Identification Verified: Yes risk of falls: Secondary Verification Process Completed: Yes Signs or symptoms of abuse/neglect since last visito No Patient Requires Transmission-Based Precautions: No Hospitalized since last visit: No Patient Has Alerts: Yes Implantable device outside of the clinic excluding No Patient Alerts: Patient on Blood Thinner cellular tissue based products placed in the center Diabetic Type 2 since last visit: Eliquis Has Dressing in Place as Prescribed: Yes Pain Present Now: No Electronic Signature(s) Signed: 01/10/2023 11:29:00 AM By: Betha Loa Entered By: Betha Loa on 01/07/2023 07:42:27 -------------------------------------------------------------------------------- Encounter Discharge Information Details Patient Name: Date of Service: Arnold Long, DO NA LD A. 01/07/2023 8:45 A M Medical Record Number: [id number] Patient Account Number: [id number] Date of Birth/Sex: Treating RN: [birth year] (67 y.o. M) Primary Care Xabi Wittler: Sumner Boast Other Clinician: Betha Loa Referring Branden Shallenberger: Treating Valeda Corzine/Extender: RO BSO N, MICHA EL  G Entzminger, Celene Squibb in Treatment: 1 Encounter Discharge Information Items Discharge Condition: Stable Ambulatory Status: Cane Discharge Destination: Home Transportation: Private Auto Accompanied By: self Schedule Follow-up Appointment: Yes Clinical Summary of Care: SHABAN, APARICIO ([id number]) [id number] Page 2 of 2 Electronic Signature(s) Signed: 01/10/2023 11:29:00 AM By: Betha Loa Entered By: Betha Loa on 01/07/2023 07:46:28 -------------------------------------------------------------------------------- Vitals Details Patient Name: Date of Service: Arnold Long, DO NA LD A. 01/07/2023 8:45 A M Medical Record Number: [id number] Patient Account Number: [id number] Date of Birth/Sex: Treating RN: 08-01-1955 (67 y.o. M) Primary Care Indie Boehne: Sumner Boast Other Clinician: Betha Loa Referring Arliss Hepburn: Treating Sabrinia Prien/Extender: RO BSO N, MICHA EL G Entzminger, Celene Squibb in Treatment: 1 Vital Signs Time Taken: 08:15 Temperature (F): 97.6 Height (in): 69 Pulse (bpm): 68 Weight (lbs): 210 Respiratory Rate (breaths/min): 18 Body Mass Index (BMI): 31 Blood Pressure (mmHg): 140/82 Capillary Blood Glucose (mg/dl): 706 Reference Range: 80 - 120 mg / dl Airway Pulse Oximetry (%): 99 Electronic Signature(s) Signed: 01/10/2023 11:29:00 AM By: Betha Loa Entered By: Betha Loa on 01/07/2023 07:42:33

## 2023-01-10 NOTE — Progress Notes (Signed)
PAETON, MARSDEN A ([id number]) [id number] Page 1 of 3 Visit Report for 01/07/2023 HBO Details Patient Name: Date of Service: Brian Long, [name] 01/07/2023 8:45 A M Medical Record Number: [id number] Patient Account Number: [id number] Date of Birth/Sex: Treating RN: 1955/05/03 (67 y.o. 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, wagoner grade 3 oe Notes: by MRI HBO Treatment Details Treatment Number: 3 Patient Type: Outpatient Chamber Type: Monoplace Chamber Serial #: [id number] Treatment Protocol: 2.0 ATA with 90 minutes oxygen, and no air breaks Treatment Details Compression Rate Down: 1.0 psi / minute De-Compression Rate Up: 1.0 psi / minute Air breaks and breathing Decompress Decompress Compress Tx Pressure Begins Reached periods Begins Ends (leave unused spaces blank) Chamber Pressure (ATA 1 2 ------2 1 ) Clock Time (24 hr) 08:25 08:39 - - - - - - 10:10 10:23 Treatment Length: 118 (minutes) Treatment Segments: 4 Vital Signs Capillary Blood Glucose Reference Range: 80 - 120 mg / dl HBO Diabetic Blood Glucose Intervention Range: <131 mg/dl or >324 mg/dl Type: Time Vitals Blood Respiratory Capillary Blood Glucose Pulse Action Pulse: Temperature: Taken: Pressure: Rate: Glucose (mg/dl): Meter #: Oximetry (%) Taken: Pre 08:15 140/82 68 18 97.6 187 1 99 treatment initiated Post 10:25 144/78 70 18 97.8 193 1 99 none per protocol Treatment Response Treatment Toleration: Well Treatment Completion Status: Treatment Completed without Adverse Event Treatment Notes Gale Hulse assessed ears and  rated a Grade 2 Teeds scale. Per Antonino Nienhuis, ok for patient to proceed with HBO Alexiz Cothran Notes Patient was also seen for wound care evaluation. Reported that he had a crackling sensation in his ears when he is undergoing treatment. No pain or drainage noted. Otoscopic examination revealed T eets grade 2 changes. I wanted to check him after his treatment today however we did not have a charged autoscope. Will have a look at him before he dives on Monday HBO Attestation I certify that I supervised this HBO treatment in accordance with Medicare CAYLEB, TRESTER A ([id number]) [id number] Page 2 of 3 guidelines. A trained emergency response team is readily available per Yes hospital policies and procedures. Continue HBOT as ordered. Yes Electronic Signature(s) Signed: 01/10/2023 4:31:08 PM By: Baltazar Najjar MD Entered By: Baltazar Najjar on 01/07/2023 09:23:55 -------------------------------------------------------------------------------- HBO Safety Checklist Details Patient Name: Date of Service: Brian Long, DO NA LD A. 01/07/2023 8:45 A M Medical Record Number: [id number] Patient Account Number: [id number] Date of Birth/Sex: Treating RN: 02-15-1955 (67 y.o. M) Primary Care Tiffeny Minchew: Sumner Boast Other Clinician: Betha Loa Referring Everett Ehrler: Treating Faris Coolman/Extender: RO BSO N, MICHA EL G Entzminger, Celene Squibb in Treatment: 1 HBO Safety Checklist Items Safety Checklist Consent Form Signed Patient voided / foley secured and emptied When did you last eato 01/07/23 Last dose of injectable or oral agent 01/07/23 Ostomy pouch emptied and vented if applicable NA All implantable devices assessed, documented and approved NA Intravenous access site secured and place NA Valuables secured Linens and cotton and cotton/polyester blend (less than 51% polyester) Personal oil-based products / skin lotions / body lotions removed Wigs or hairpieces  removed Smoking or tobacco materials removed Books / newspapers / magazines / loose paper removed Cologne, aftershave, perfume and deodorant removed Jewelry removed (may wrap wedding band)  Make-up removed NA Hair care products removed Battery operated devices (external) removed NA Heating patches and chemical warmers removed NA Titanium eyewear removed Nail polish cured greater than 10 hours NA Casting material cured greater than 10 hours NA Hearing aids removed NA Loose dentures or partials removed NA Prosthetics have been removed NA Patient demonstrates correct use of air break device (if applicable) Patient concerns have been addressed Patient grounding bracelet on and cord attached to chamber Specifics for Inpatients (complete in addition to above) Medication sheet sent with patient NA Intravenous medications needed or due during therapy sent with patient NA Drainage tubes (e.g. nasogastric tube or chest tube secured and vented) NA Endotracheal or Tracheotomy tube secured NA Cuff deflated of air and inflated with saline NA Airway suctioned NA DENALI, GOTTO A ([id number]) [id number] Page 3 of 3 Electronic Signature(s) Signed: 01/10/2023 11:29:00 AM By: Betha Loa Entered By: Betha Loa on 01/07/2023 07:42:41

## 2023-01-10 NOTE — Progress Notes (Signed)
BISHOY, RAATZ A ([id number]) [id number] Page 1 of 2 Visit Report for 01/10/2023 HBO Details Patient Name: Date of Service: Brian Long, [name] 01/10/2023 7:30 A M Medical Record Number: [id number] Patient Account Number: [id number] Date of Birth/Sex: Treating RN: 22-Aug-1955 (67 y.o. 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, wagoner grade 3 oe Notes: by MRI HBO Treatment Details Treatment Number: 4 Patient Type: Outpatient Chamber Type: Monoplace Chamber Serial #: [id number] Treatment Protocol: 2.0 ATA with 90 minutes oxygen, and no air breaks Treatment Details Compression Rate Down: 2.0 psi / minute De-Compression Rate Up: 1.0 psi / minute Air breaks and breathing Decompress Decompress Compress Tx Pressure Begins Reached periods Begins Ends (leave unused spaces blank) Chamber Pressure (ATA 1 2 ------2 1 ) Clock Time (24 hr) 7:58 8:13 - - - - - - 9:43 9:58 Treatment Length: 120 (minutes) Treatment Segments: 4 Vital Signs Capillary Blood Glucose Reference Range: 80 - 120 mg / dl HBO Diabetic Blood Glucose Intervention Range: <131 mg/dl or >536 mg/dl Time Vitals Blood Respiratory Capillary Blood Glucose Pulse Action Type: Pulse: Temperature: Taken: Pressure: Rate: Glucose (mg/dl): Meter #: Oximetry (%) Taken: Pre 07:30 122/80 65 18 98 256 1 99 Post 09:58 124/80 66 18 97.8 206 1 Treatment Response Treatment Toleration: Well Treatment Completion Status: Treatment Completed without Adverse Event [name] Notes No concerns with treatment given. Patient is tympanic membranes looked a lot  better. Apparently left treatment without issue HBO Attestation I certify that I supervised this HBO treatment in accordance with Medicare guidelines. A trained emergency response team is readily available per Yes hospital policies and procedures. Continue HBOT as ordered. PANKAJ, HOUCHEN A ([id number]) [id number] Page 2 of 2 Electronic Signature(s) Signed: 01/10/2023 4:31:08 PM By: Baltazar Najjar MD Entered By: Baltazar Najjar on 01/10/2023 12:12:45 -------------------------------------------------------------------------------- HBO Safety Checklist Details Patient Name: Date of Service: Brian Long, DO NA LD A. 01/10/2023 7:30 A M Medical Record Number: [id number] Patient Account Number: [id number] Date of Birth/Sex: Treating RN: 1955/11/02 (67 y.o. M) Primary Care Altamese Deguire: Sumner Boast Other Clinician: Demetria Pore Referring Gionni Vaca: Treating Derrick Orris/Extender: Chauncey Mann, MICHA EL G Entzminger, Celene Squibb in Treatment: 2 HBO Safety Checklist Items Safety Checklist Consent Form Signed Patient voided / foley secured and emptied When did you last eato 01/10/23 Last dose of injectable or oral agent 01/10/23 Ostomy pouch emptied and vented if applicable NA All implantable devices assessed, documented and approved NA Intravenous access site secured and place NA Valuables secured Linens and cotton and cotton/polyester blend (less than 51% polyester) Personal oil-based products / skin lotions / body lotions removed Wigs or hairpieces removed NA Smoking or tobacco materials removed NA Books / newspapers / magazines / loose paper removed Cologne, aftershave, perfume and deodorant removed Jewelry removed (may wrap wedding band) Make-up removed NA Hair care products removed Battery operated devices (external) removed NA Heating patches and chemical warmers removed NA Titanium eyewear removed NA Nail polish cured greater than 10  hours NA Casting material cured greater than 10 hours NA Hearing aids removed NA Loose dentures or partials removed NA Prosthetics have been removed NA Patient demonstrates correct use of air break device (if applicable) Patient concerns have been addressed Patient  grounding bracelet on and cord attached to chamber Specifics for Inpatients (complete in addition to above) Medication sheet sent with patient NA Intravenous medications needed or due during therapy sent with patient NA Drainage tubes (e.g. nasogastric tube or chest tube secured and vented) NA Endotracheal or Tracheotomy tube secured NA Cuff deflated of air and inflated with saline NA Airway suctioned NA Electronic Signature(s) Signed: 01/10/2023 3:32:28 PM By: Demetria Pore Entered By: Demetria Pore on 01/10/2023 07:58:17

## 2023-01-10 NOTE — Progress Notes (Signed)
DREW, LUDEMAN A ([id number]) [id number] Page 1 of 2 Visit Report for 01/10/2023 Arrival Information Details Patient Name: Date of Service: Brian Long, [name] 01/10/2023 7:30 A M Medical Record Number: [id number] Patient Account Number: [id number] Date of Birth/Sex: Treating RN: March 30, 1955 (67 y.o. M) Primary Care Hershel Corkery: Sumner Boast Other Clinician: Demetria Pore Referring Keshonda Monsour: Treating Ciarah Peace/Extender: RO BSO Dorris Carnes, MICHA EL G Entzminger, Celene Squibb in Treatment: 2 Visit Information History Since Last Visit Added or deleted any medications: No Patient Arrived: Ambulatory Any new allergies or adverse reactions: No Arrival Time: 07:30 Had a fall or experienced change in No Accompanied By: self activities of daily living that may affect Transfer Assistance: None risk of falls: Patient Identification Verified: Yes Signs or symptoms of abuse/neglect since last visito No Secondary Verification Process Completed: Yes Hospitalized since last visit: No Patient Requires Transmission-Based Precautions: No Implantable device outside of the clinic excluding No Patient Has Alerts: Yes cellular tissue based products placed in the center Patient Alerts: Patient on Blood Thinner since last visit: Diabetic Type 2 Pain Present Now: No Eliquis Electronic Signature(s) Signed: 01/10/2023 3:32:28 PM By: Demetria Pore Entered By: Demetria Pore on 01/10/2023 07:58:12 -------------------------------------------------------------------------------- Encounter Discharge Information Details Patient Name: Date of Service: Brian Long, DO NA LD A. 01/10/2023 7:30 A M Medical Record Number: [id number] Patient Account Number: [id number] Date of Birth/Sex: Treating RN: Aug 07, 1955 (67 y.o. M) Primary Care Tyrick Dunagan: Sumner Boast Other Clinician: Demetria Pore Referring Elvira Langston: Treating Darnell Jeschke/Extender: RO BSO Dorris Carnes, MICHA EL G Entzminger, Celene Squibb in  Treatment: 2 Encounter Discharge Information Items Discharge Condition: Stable Ambulatory Status: Ambulatory Discharge Destination: Home Transportation: Private Auto Accompanied By: self Schedule Follow-up Appointment: Yes Clinical Summary of Care: [name], [name] ([id number]) [id number] Page 2 of 2 Electronic Signature(s) Signed: 01/10/2023 3:32:28 PM By: Francoise Ceo By: Demetria Pore on 01/10/2023 07:59:49 -------------------------------------------------------------------------------- Vitals Details Patient Name: Date of Service: Brian Long, DO NA LD A. 01/10/2023 7:30 A M Medical Record Number: [id number] Patient Account Number: [id number] Date of Birth/Sex: Treating RN: Feb 11, 1955 (67 y.o. M) Primary Care Khloi Rawl: Sumner Boast Other Clinician: Demetria Pore Referring Zamzam Whinery: Treating Elsie Sakuma/Extender: RO BSO N, MICHA EL G Entzminger, Celene Squibb in Treatment: 2 Vital Signs Time Taken: 07:30 Temperature (F): 98.0 Height (in): 69 Pulse (bpm): 65 Weight (lbs): 210 Respiratory Rate (breaths/min): 18 Body Mass Index (BMI): 31 Blood Pressure (mmHg): 122/80 Capillary Blood Glucose (mg/dl): 433 Reference Range: 80 - 120 mg / dl Airway Pulse Oximetry (%): 99 Electronic Signature(s) Signed: 01/10/2023 3:32:28 PM By: Demetria Pore Entered By: Demetria Pore on 01/10/2023 07:58:14

## 2023-01-10 NOTE — Progress Notes (Signed)
BARI, DOORLEY A ([id number]) [id number] Page 1 of 2 Visit Report for 01/07/2023 Problem List Details Patient Name: Date of Service: Brian Long, [name] 01/07/2023 8:45 A M Medical Record Number: [id number] Patient Account Number: [id number] Date of Birth/Sex: Treating RN: April 08, 1955 (67 y.o. M) Primary Care Provider: Sumner Boast Other Clinician: Betha Loa Referring Provider: Treating Provider/Extender: RO BSO N, MICHA EL G Entzminger, Celene Squibb in Treatment: 1 Active Problems ICD-10 Encounter Code Description Active Date MDM Diagnosis E11.621 Type 2 diabetes mellitus with foot ulcer 12/27/2022 No Yes L97.512 Non-pressure chronic ulcer of other part of right foot with fat layer exposed 12/27/2022 No Yes I10 Essential (primary) hypertension 12/27/2022 No Yes I48.0 Paroxysmal atrial fibrillation 12/27/2022 No Yes Z79.01 [name] term (current) use of anticoagulants 12/27/2022 No Yes Z95.0 Presence of cardiac pacemaker 12/27/2022 No Yes Inactive Problems Resolved Problems Electronic Signature(s) Signed: 01/10/2023 11:29:00 AM By: Betha Loa Signed: 01/10/2023 4:31:08 PM By: Baltazar Najjar MD Entered By: Betha Loa on 01/07/2023 07:45:47 Sannes, Jerryl A ([id number]) [id number] Page 2 of 2 -------------------------------------------------------------------------------- SuperBill Details Patient Name: Date of Service: Brian Long, DO NA LD A. 01/07/2023 Medical Record Number: [id number] Patient Account Number: [id number] Date of Birth/Sex: Treating RN: [birth year] (67 y.o. 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  part of right foot with fat layer exposed I10 Essential (primary) hypertension I48.0 Paroxysmal atrial fibrillation Z79.01 [name] term (current) use of anticoagulants Z95.0 Presence of cardiac pacemaker Facility Procedures : CPT4 Code: 56387564 Description: (Facility Use Only) HBOT full body chamber, , Modifier: Quantity: 4 Physician Procedures : CPT4: Description Modifier Code 33295 [id number] Physician attendance and supervision of hyperbaric oxygen therapy, per sessionsupervision of hyperbaric Oxygen therapy, per session ICD-10 Diagnosis Description L97.512 Non-pressure chronic ulcer of other part  of right foot with fat layer exposed E11.621 Type 2 diabetes mellitus with foot ulcer Quantity: 1 Electronic Signature(s) Signed: 01/10/2023 11:29:00 AM By: Betha Loa Signed: 01/10/2023 4:31:08 PM By: Baltazar Najjar MD Entered By: Betha Loa on 01/07/2023 07:45:41

## 2023-01-10 NOTE — Progress Notes (Signed)
LINELL, RUDDOCK A ([id number]) [id number] Page 1 of 8 Visit Report for 01/07/2023 Debridement Details Patient Name: Date of Service: Brian Long, [name] 01/07/2023 7:30 A M Medical Record Number: [id number] Patient Account Number: [id number] Date of Birth/Sex: Treating RN: July 02, 1955 (67 y.o. [name] Primary Care Provider: Sumner Boast Other Clinician: Referring Provider: Treating Provider/Extender: RO BSO N, MICHA EL G Entzminger, Celene Squibb in Treatment: 1 Debridement Performed for Assessment: Wound #1 Right,Anterior T Second oe Performed By: Physician Maxwell Caul, MD The following information was scribed by: Angelina Pih The information was scribed for: Maguire Sime G Debridement Type: Debridement Severity of Tissue Pre Debridement: Fat layer exposed Level of Consciousness (Pre-procedure): Awake and Alert Pre-procedure Verification/Time Out Yes - 07:56 Taken: Pain Control: Lidocaine 4% T opical Solution Percent of Wound Bed Debrided: 100% T Area Debrided (cm): otal 1.57 Tissue and other material debrided: Viable, Non-Viable, Slough, Subcutaneous, Slough Level: Skin/Subcutaneous Tissue Debridement Description: Excisional Instrument: Curette Bleeding: Moderate Hemostasis Achieved: Pressure Response to Treatment: Procedure was tolerated well Level of Consciousness (Post- Awake and Alert procedure): Post Debridement Measurements of Total Wound Length: (cm) 2 Width: (cm) 1 Depth: (cm) 0.2 Volume: (cm) 0.314 Character of Wound/Ulcer Post Debridement: Stable Severity of Tissue Post Debridement: Fat layer exposed Post Procedure Diagnosis Same as Pre-procedure Electronic Signature(s) Signed: 01/07/2023 12:38:04 PM By: Angelina Pih Signed: 01/10/2023 4:31:08 PM By: Baltazar Najjar MD Entered By: Angelina Pih on 01/07/2023 04:57:19 [name] ([id number]) [id number] Page 2  of 8 -------------------------------------------------------------------------------- HPI Details Patient Name: Date of Service: Brian Long, DO NA LD A. 01/07/2023 7:30 A M Medical Record Number: [id number] Patient Account Number: [id number] Date of Birth/Sex: Treating RN: [birth year] (67 y.o. [name] Primary Care Provider: Sumner Boast Other Clinician: Referring Provider: Treating Provider/Extender: RO BSO N, MICHA EL G Entzminger, Celene Squibb in Treatment: 1 History of Present Illness Chronic/Inactive Conditions Condition 1: 12-27-2022 patient's ABI in the office was 0.92 on the right. With that being said he did have a bilateral lower extremity arterial duplex evaluation and this was on December 16, 2022. The result of this showed abnormal left extremity arterial Doppler consistent with stenosis within the distal superficial femoral and popliteal arteries. CTA of the abdominal aorta including lower extremity runoffs is recommended. There was stated to be no significant plaque formation visualized within the arteries of the right lower extremity HPI Description: 12-27-2022 upon evaluation today patient presents for initial inspection here in the clinic as a referral from his primary care provider, Dr. Sherrin Daisy. With that being said I did discuss with the patient the treatment that is been undertaken up to this point as well as reviewing what I could in epic although I could not see any of the actual PCP notes it sounds as if she has been doing an awesome job with regard to the care of the patient up to this point. He does have osteomyelitis of the second toe right foot. With that being said there is a wound here as well there is some muscle/tendon exposed some of this necrotic as well over the area in question. 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 I do believe that he has had excellent care and has been on antibiotics since  actually 8 November initially he was placed on it appears doxycycline and then transition to Bactrim which is what he is still on currently. Fortunately he has seen some signs of improvement with  regard to the infection which I think is definitely good but I think he still at high risk as far as amputation of the toe is concerned and I discussed that with him today as well. Overall I believe that he is a strong candidate for hyperbaric oxygen therapy to try to get his wound to heal. I discussed with the patient how this can increase his chances of healing from around 50% with what we have going on right now to closer to the 85% with HBO therapy. This is obviously a fairly significant shift and though not 100% gives him the best chance I think to try to get this wound healed without progressing to further intervention such as amputation. Patient does have a history of diabetes mellitus type 2, hypertension, atrial fibrillation, [name]-term use of anticoagulant therapy, and he does have a cardiac pacemaker. I did review notes through epic. He has had a recent EKG which did not appear to be significantly different from an EKG even about a month ago which was good news. This EKG was performed 12-17-2022 and the when it was compared to was 11-19-2022 and 10-25-2022. Subsequently the patient also has undergone an arterial lower extremity duplex which showed on 12-15-2022 that there was no significant plaque formation visualized within the arteries of the right lower extremity. It would appear that he has sufficient arterial flow to heal although he does have further follow-up with vascular as well. This is mainly in regard to his left lower extremity. He did have an MRI on December 10, 2022 which shows that he does have osteomyelitis which was stated to be acute at that point of the middle phalanx of the second toe right foot. Again he has been on antibiotics since November 19, 2022. 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 I did review the patient's CBC and currently white blood cell count was 9.0 hemoglobin 12.6 hematocrit 37.9 otherwise everything was within normal limits. I did also review the basic metabolic panel which showed that the patient does have elevated creatinine with a normal BUN. Estimated GFR was 44. Upon reviewing notes on 12-17-2022 also came across the note where the patient has had an echo on March 05, 2022 which did demonstrated left ventricular ejection fraction estimated to be 60 to 65% with normal function of the left ventricle and no regional wall abnormalities. There was just mild ventricular hypertrophy. Patient's most recent hemoglobin A1c was November 04, 2022 and was at 9.0. 12/27; this is a patient with a difficult wound over the dorsal surface of his right second toe. He had an MRI on 11/24 showing acute osteomyelitis in the middle of phalanx and either reactive osteitis or early acute osteomyelitis involving the proximal phalanx. He tells me he has completed antibiotics but I am really not sure of the duration of treatment. I do not see any relevant cultures in epic. He started HBO on Monday and said he had some ear pain in his first dive but feels better lately although his ears feel somewhat "stuffy". No pain We have been using Prisma as the primary dressing. He has a surgical shoe that would preclude any pressure or friction on the involved area. The patient had a CT angiogram and by [facility] with and without contrast on 12/21. This did not show any acute abnormality within the abdomen or pelvis no hemodynamically significant peripheral arterial disease in either lower extremity. 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 Aortic atherosclerosis but nothing that seems to indicate an adequate flow for healing the wound we are concerned with Electronic Signature(s) Signed: 01/10/2023 4:31:08 PM By: Baltazar Najjar MD Entered By: Baltazar Najjar on 01/07/2023 05:06:37 -------------------------------------------------------------------------------- Physical Exam Details Patient Name: Date of Service: Brian Long, DO NA LD A. 01/07/2023 7:30 A M Medical Record Number: [id number] Patient Account Number: [id number] Date of Birth/Sex: Treating RN: 10-07-1955 (67 y.o. [name] Primary Care Provider: Sumner Boast Other Clinician: Referring Provider: Treating Provider/Extender: RO BSO Dorris Carnes, MICHA EL G Entzminger, Celene Squibb in Treatment: 1 AMANPREET, OATHOUT A ([id number]) [id number] Page 3 of 8 Constitutional Patient is hypotensive.. Pulse regular and within target range for patient.Marland Kitchen Respirations regular, non-labored and within target range.. Temperature is normal and within the target range for the patient.Marland Kitchen appears in no distress. Cardiovascular Pedal pulses in the right foot are slightly reduced but palpable. Notes Wound exam; I used a #3 curette to remove a very fibrinous slough from the wound surface dipping into subcutaneous tissue minimal bleeding. There is no exposed bone although the wound does have some depth. No evidence of surrounding infection no palpable tenderness of the PIP or DIP joints Electronic Signature(s) Signed: 01/10/2023 4:31:08 PM By: Baltazar Najjar MD Entered By: Baltazar Najjar on 01/07/2023 05:07:47 -------------------------------------------------------------------------------- Physician Orders Details Patient Name: Date of Service: Brian Long, DO NA LD A. 01/07/2023 7:30 A M Medical Record Number: [id number] Patient Account Number: [id number] Date of Birth/Sex: Treating RN: [birth year] (67 y.o. [name] Primary Care Provider: Sumner Boast Other Clinician: Referring Provider: Treating Provider/Extender: RO BSO N, MICHA EL G Entzminger, Celene Squibb in Treatment: 1 The following information was scribed by:  Angelina Pih The information was scribed for: Maxwell Caul Verbal / Phone Orders: No Diagnosis Coding Follow-up Appointments Return Appointment in 1 week. Bathing/ Shower/ Hygiene May shower; gently cleanse wound with antibacterial soap, rinse and pat dry prior to dressing wounds Anesthetic (Use 'Patient Medications' Section for Anesthetic Order Entry) Lidocaine applied to wound bed Hyperbaric Oxygen Therapy Wound #1 Right,Anterior T Second oe Evaluate for HBO Therapy Indication and location: - Right 2nd T Ulcer, wagoner grade 3 by MRI oe If appropriate for treatment, begin HBOT per protocol: 2.0 ATA for 90 Minutes without A Breaks ir One treatment per day (delivered Monday through Friday unless otherwise specified in Special Instructions below): Total # of Treatments: - 40 A ntihistamine 30 minutes prior to HBO Treatment, difficulty clearing ears. Finger stick Blood Glucose Pre- and Post- HBOT Treatment. Follow Hyperbaric Oxygen Glycemia Protocol Wound Treatment Wound #1 - T Second oe Wound Laterality: Right, Anterior Cleanser: Byram Ancillary Kit - 15 Day Supply (Generic) Every Other Day/30 Days Discharge Instructions: Use supplies as instructed; Kit contains: (15) Saline Bullets; (15) 3x3 Gauze; 15 pr Gloves Cleanser: Soap and Water Every Other Day/30 Days Discharge Instructions: Gently cleanse wound with antibacterial soap, rinse and pat dry prior to dressing wounds Cleanser: Vashe 5.8 (oz) Every Other Day/30 Days Discharge Instructions: Use vashe 5.8 (oz) as directed Prim Dressing: Prisma 4.34 (in) (Generic) Every Other Day/30 Days ary EREN, HETZEL A ([id number]) [id number] Page 4 of 8 Discharge Instructions: Moisten w/normal saline or sterile water; Cover wound as directed. Do not remove from wound bed. Secondary Dressing: Gauze Every Other Day/30 Days Discharge Instructions: As directed: dry Secured With: Conform 2''- Conforming  Stretch Gauze Bandage 2x75 (in/in) Every Other Day/30 Days Discharge Instructions: Apply as directed Secured With: Stretch Net Dressing, Latex-free, Size 5, Small-Head / Shoulder /  Thigh Every Other Day/30 Days Discharge Instructions: size 2 used GLYCEMIA INTERVENTIONS PROTOCOL PRE-HBO GLYCEMIA INTERVENTIONS ACTION INTERVENTION Obtain pre-HBO capillary blood glucose (ensure 1 physician order is in chart). A. Notify HBO physician and await physician orders. 2 If result is 70 mg/dl or below: B. If the result meets the hospital definition of a critical result, follow hospital policy. A. Give patient an 8 ounce Glucerna Shake, an 8 ounce Ensure, or 8 ounces of a Glucerna/Ensure equivalent dietary supplement*. B. Wait 30 minutes. If result is 71 mg/dl to 253 mg/dl: C. Retest patients capillary blood glucose (CBG). D. If result greater than or equal to 110 mg/dl, proceed with HBO. If result less than 110 mg/dl, notify HBO physician and consider holding HBO. If result is 131 mg/dl to 664 mg/dl: A. Proceed with HBO. A. Notify HBO physician and await physician orders. B. It is recommended to hold HBO and do If result is 250 mg/dl or greater: blood/urine ketone testing. C. If the result meets the hospital definition of a critical result, follow hospital policy. POST-HBO GLYCEMIA INTERVENTIONS ACTION INTERVENTION Obtain post HBO capillary blood glucose (ensure 1 physician order is in chart). A. Notify HBO physician and await physician orders. 2 If result is 70 mg/dl or below: B. If the result meets the hospital definition of a critical result, follow hospital policy. A. Give patient an 8 ounce Glucerna Shake, an 8 ounce Ensure, or 8 ounces of a Glucerna/Ensure equivalent dietary supplement*. B. Wait 15 minutes for symptoms of If result is 71 mg/dl to 403 mg/dl: hypoglycemia (i.e. nervousness, anxiety, sweating, chills, clamminess, irritability, confusion, tachycardia or  dizziness). C. If patient asymptomatic, discharge patient. If patient symptomatic, repeat capillary blood glucose (CBG) and notify HBO physician. If result is 101 mg/dl to 474 mg/dl: A. Discharge patient. A. Notify HBO physician and await physician orders. B. It is recommended to do blood/urine ketone If result is 250 mg/dl or greater: testing. C. If the result meets the hospital definition of a critical result, follow hospital policy. *Juice or candies are NOT equivalent products. If patient refuses the Glucerna or Ensure, please consult the hospital dietitian for an appropriate substitute. Electronic Signature(s) Signed: 01/07/2023 12:38:04 PM By: Angelina Pih Signed: 01/10/2023 4:31:08 PM By: Baltazar Najjar MD Entered By: Angelina Pih on 01/07/2023 05:05:02 GEMARI, KENDAL A ([id number]) [id number] Page 5 of 8 -------------------------------------------------------------------------------- Problem List Details Patient Name: Date of Service: Brian Long, DO NA LD A. 01/07/2023 7:30 A M Medical Record Number: [id number] Patient Account Number: [id number] Date of Birth/Sex: Treating RN: 03/04/1955 (67 y.o. [name] Primary Care Provider: Sumner Boast Other Clinician: Referring Provider: Treating Provider/Extender: RO BSO N, MICHA EL G Entzminger, Celene Squibb in Treatment: 1 Active Problems ICD-10 Encounter Code Description Active Date MDM Diagnosis E11.621 Type 2 diabetes mellitus with foot ulcer 12/27/2022 No Yes L97.512 Non-pressure chronic ulcer of other part of right foot with fat layer exposed 12/27/2022 No Yes I10 Essential (primary) hypertension 12/27/2022 No Yes I48.0 Paroxysmal atrial fibrillation 12/27/2022 No Yes Z79.01 [name] term (current) use of anticoagulants 12/27/2022 No Yes Z95.0 Presence of cardiac pacemaker 12/27/2022 No Yes Inactive Problems Resolved Problems Electronic Signature(s) Signed: 01/10/2023  4:31:08 PM By: Baltazar Najjar MD Entered By: Baltazar Najjar on 01/07/2023 05:01:51 Progress Note Details -------------------------------------------------------------------------------- [name] ([id number]) [id number] Page 6 of 8 Patient Name: Date of Service: Brian Long, DO NA LD A. 01/07/2023 7:30 A M Medical Record Number: [id number] Patient Account Number: [id number] Date of Birth/Sex: Treating RN: [birth year] (67 y.o.  [name] Primary Care Provider: Sumner Boast Other Clinician: Referring Provider: Treating Provider/Extender: RO BSO N, MICHA EL G Entzminger, Celene Squibb in Treatment: 1 Subjective History of Present Illness (HPI) Chronic/Inactive Condition: 12-27-2022 patient's ABI in the office was 0.92 on the right. With that being said he did have a bilateral lower extremity arterial duplex evaluation and this was on December 16, 2022. The result of this showed abnormal left extremity arterial Doppler consistent with stenosis within the distal superficial femoral and popliteal arteries. CTA of the abdominal aorta including lower extremity runoffs is recommended. There was stated to be no significant plaque formation visualized within the arteries of the right lower extremity 12-27-2022 upon evaluation today patient presents for initial inspection here in the clinic as a referral from his primary care provider, Dr. Sherrin Daisy. With that being said I did discuss with the patient the treatment that is been undertaken up to this point as well as reviewing what I could in epic although I could not see any of the actual PCP notes it sounds as if she has been doing an awesome job with regard to the care of the patient up to this point. He does have osteomyelitis of the second toe right foot. With that being said there is a wound here as well there is some muscle/tendon exposed some of this necrotic as well over the area in question. With  that being said he does have what would be termed a grade 3 Wagner diabetic ulcer. I do believe that he has had excellent care and has been on antibiotics since actually 8 November initially he was placed on it appears doxycycline and then transition to Bactrim which is what he is still on currently. Fortunately he has seen some signs of improvement with regard to the infection which I think is definitely good but I think he still at high risk as far as amputation of the toe is concerned and I discussed that with him today as well. Overall I believe that he is a strong candidate for hyperbaric oxygen therapy to try to get his wound to heal. I discussed with the patient how this can increase his chances of healing from around 50% with what we have going on right now to closer to the 85% with HBO therapy. This is obviously a fairly significant shift and though not 100% gives him the best chance I think to try to get this wound healed without progressing to further intervention such as amputation. Patient does have a history of diabetes mellitus type 2, hypertension, atrial fibrillation, [name]-term use of anticoagulant therapy, and he does have a cardiac pacemaker. I did review notes through epic. He has had a recent EKG which did not appear to be significantly different from an EKG even about a month ago which was good news. This EKG was performed 12-17-2022 and the when it was compared to was 11-19-2022 and 10-25-2022. Subsequently the patient also has undergone an arterial lower extremity duplex which showed on 12-15-2022 that there was no significant plaque formation visualized within the arteries of the right lower extremity. It would appear that he has sufficient arterial flow to heal although he does have further follow-up with vascular as well. This is mainly in regard to his left lower extremity. He did have an MRI on December 10, 2022 which shows that he does have osteomyelitis which was stated to be  acute at that point of the middle phalanx of the second toe right foot. Again he  has been on antibiotics since November 19, 2022. 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 I did review the patient's CBC and currently white blood cell count was 9.0 hemoglobin 12.6 hematocrit 37.9 otherwise everything was within normal limits. I did also review the basic metabolic panel which showed that the patient does have elevated creatinine with a normal BUN. Estimated GFR was 44. Upon reviewing notes on 12-17-2022 also came across the note where the patient has had an echo on March 05, 2022 which did demonstrated left ventricular ejection fraction estimated to be 60 to 65% with normal function of the left ventricle and no regional wall abnormalities. There was just mild ventricular hypertrophy. Patient's most recent hemoglobin A1c was November 04, 2022 and was at 9.0. 12/27; this is a patient with a difficult wound over the dorsal surface of his right second toe. He had an MRI on 11/24 showing acute osteomyelitis in the middle of phalanx and either reactive osteitis or early acute osteomyelitis involving the proximal phalanx. He tells me he has completed antibiotics but I am really not sure of the duration of treatment. I do not see any relevant cultures in epic. He started HBO on Monday and said he had some ear pain in his first dive but feels better lately although his ears feel somewhat "stuffy". No pain We have been using Prisma as the primary dressing. He has a surgical shoe that would preclude any pressure or friction on the involved area. The patient had a CT angiogram and by [facility] with and without contrast on 12/21. This did not show any acute abnormality within the abdomen or pelvis no hemodynamically significant peripheral arterial disease in either lower extremity. 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 Aortic atherosclerosis but  nothing that seems to indicate an adequate flow for healing the wound we are concerned with Objective Constitutional Patient is hypotensive.. Pulse regular and within target range for patient.Marland Kitchen Respirations regular, non-labored and within target range.. Temperature is normal and within the target range for the patient.Marland Kitchen appears in no distress. Vitals Time Taken: 7:41 AM, Height: 69 in, Weight: 210 lbs, BMI: 31, Temperature: 97.6 F, Pulse: 67 bpm, Respiratory Rate: 18 breaths/min, Blood Pressure: 160/92 mmHg. Cardiovascular Pedal pulses in the right foot are slightly reduced but palpable. General Notes: Wound exam; I used a #3 curette to remove a very fibrinous slough from the wound surface dipping into subcutaneous tissue minimal bleeding. There is no exposed bone although the wound does have some depth. No evidence of surrounding infection no palpable tenderness of the PIP or DIP joints Integumentary (Hair, Skin) Wound #1 status is Open. Original cause of wound was Gradually Appeared. The date acquired was: 11/27/2022. The wound has been in treatment 1 weeks. The wound is located on the Right,Anterior T Second. The wound measures 2cm length x 1cm width x 0.1cm depth; 1.571cm^2 area and 0.157cm^3 volume. There oe is Fat Layer (Subcutaneous Tissue) exposed. There is no tunneling or undermining noted. There is a medium amount of serosanguineous drainage noted. There is small (1-33%) pink granulation within the wound bed. There is a medium (34-66%) amount of necrotic tissue within the wound bed including Eschar and Adherent Slough. EROL, ODAM A ([id number]) [id number] Page 7 of 8 Assessment Active Problems ICD-10 Type 2 diabetes mellitus with foot ulcer Non-pressure chronic ulcer of other part of right foot with fat layer exposed Essential (primary) hypertension Paroxysmal atrial fibrillation [name] term (current) use  of anticoagulants Presence of cardiac  pacemaker Procedures Wound #1 Pre-procedure diagnosis of Wound #1 is a Diabetic Wound/Ulcer of the Lower Extremity located on the Right,Anterior T Second .Severity of Tissue Pre oe Debridement is: Fat layer exposed. There was a Excisional Skin/Subcutaneous Tissue Debridement with a total area of 1.57 sq cm performed by Maxwell Caul, MD. With the following instrument(s): Curette to remove Viable and Non-Viable tissue/material. Material removed includes Subcutaneous Tissue and Slough and after achieving pain control using Lidocaine 4% Topical Solution. No specimens were taken. A time out was conducted at 07:56, prior to the start of the procedure. A Moderate amount of bleeding was controlled with Pressure. The procedure was tolerated well. Post Debridement Measurements: 2cm length x 1cm width x 0.2cm depth; 0.314cm^3 volume. Character of Wound/Ulcer Post Debridement is stable. Severity of Tissue Post Debridement is: Fat layer exposed. Post procedure Diagnosis Wound #1: Same as Pre-Procedure Plan Follow-up Appointments: Return Appointment in 1 week. 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 Finger stick Blood Glucose Pre- and Post- HBOT Treatment. Follow Hyperbaric Oxygen Glycemia Protocol WOUND #1: - T Second Wound Laterality:  Right, Anterior oe Cleanser: Byram Ancillary Kit - 15 Day Supply (Generic) Every Other Day/30 Days Discharge Instructions: Use supplies as instructed; Kit contains: (15) Saline Bullets; (15) 3x3 Gauze; 15 pr Gloves Cleanser: Soap and Water Every Other Day/30 Days Discharge Instructions: Gently cleanse wound with antibacterial soap, rinse and pat dry prior to dressing wounds Cleanser: Vashe 5.8 (oz) Every Other Day/30 Days Discharge Instructions: Use vashe 5.8 (oz) as directed Prim Dressing: Prisma 4.34 (in) (Generic) Every Other Day/30 Days ary Discharge Instructions: Moisten w/normal saline or sterile water; Cover wound as directed. Do not remove from wound bed. Secondary Dressing: Gauze Every Other Day/30 Days Discharge Instructions: As directed: dry Secured With: Conform 2''- Conforming Stretch Gauze Bandage 2x75 (in/in) Every Other Day/30 Days Discharge Instructions: Apply as directed Secured With: Stretch Net Dressing, Latex-free, Size 5, Small-Head / Shoulder / Thigh Every Other Day/30 Days Discharge Instructions: size 2 used 1. I will check his ears before he dives today. He is on Afrin 2. Debridement as noted wound cleans up quite nicely. No reason to change the dressing at this early stage 3. He does not have any flow-limiting stenosis on the right leg and foot 4. I am really uncertain about the duration of antibiotic treatment and whether this was done empirically or specifically for the cultured organism. #5 there is quite a deformity of the forefoot with the involved right second toe riding over top of the first toe. I warned him against wearing any other foot wear other than our surgical shoe I am sure that toe would be pushed against the surface of most foot wear superiorly Electronic Signature(s) Signed: 01/10/2023 4:31:08 PM By: Baltazar Najjar MD Entered By: Baltazar Najjar on 01/07/2023 05:09:50 [name] ([id number]) [id number] Page 8  of 8 --------------------------------------------------------------------------------  SuperBill Details Patient Name: Date of Service: Brian Long, [name] 01/07/2023 Medical Record Number: [id number] Patient Account Number: [id number] Date of Birth/Sex: Treating RN: Jun 02, 1955 (67 y.o. 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 I10 Essential (primary) hypertension I48.0 Paroxysmal atrial fibrillation Z79.01 [name] term (current) use of anticoagulants Z95.0 Presence of cardiac pacemaker Facility Procedures : CPT4 Code: 78295621 Description: 11042 - DEB SUBQ TISSUE 20 SQ CM/< ICD-10 Diagnosis Description E11.621 Type 2 diabetes mellitus with foot ulcer L97.512 Non-pressure chronic ulcer of other part of right foot with fat layer exposed Modifier: Quantity: 1 Physician Procedures : CPT4 Code Description Modifier 11042 11042 - WC PHYS SUBQ TISS 20 SQ CM ICD-10 Diagnosis Description E11.621 Type 2 diabetes mellitus with foot ulcer L97.512 Non-pressure chronic ulcer of other part of right foot with fat layer exposed Quantity: 1 Electronic Signature(s) Signed: 01/10/2023 4:31:08 PM By: Baltazar Najjar MD Entered By: Baltazar Najjar on 01/07/2023 05:10:03

## 2023-01-11 ENCOUNTER — Encounter: Payer: Medicare HMO | Admitting: Internal Medicine

## 2023-01-11 DIAGNOSIS — E11621 Type 2 diabetes mellitus with foot ulcer: Secondary | ICD-10-CM | POA: Diagnosis not present

## 2023-01-11 LAB — GLUCOSE, CAPILLARY
Glucose-Capillary: 200 mg/dL — ABNORMAL HIGH (ref 70–99)
Glucose-Capillary: 189 mg/dL — ABNORMAL HIGH (ref 70–99)

## 2023-01-11 NOTE — Progress Notes (Signed)
 Brian, [name] ([id number]) [id number] Page 1 of 2 Visit Report for 01/11/2023 Arrival Information Details Patient Name: Date of Service: [name], [name]. 01/11/2023 7:30 [name] Medical Record Number: [id number] Patient Account Number: [id number] Date of Birth/Sex: Treating RN: 1955-04-25 (67 y.o. M) Primary Care August Gosser: Sampson Scales Other Clinician: [name] Referring Jannely Henthorn: Treating Bristal Steffy/Extender: RO BSO LOISE, MICHA EL G Entzminger, Scales Duos in Treatment: 2 Visit Information History Since Last Visit Added or deleted any medications: No Patient Arrived: Ambulatory Any new allergies or adverse reactions: No Arrival Time: 07:30 Had [name] fall or experienced change in No Accompanied By: self activities of daily living that may affect Transfer Assistance: None risk of falls: Patient Identification Verified: Yes Signs or symptoms of abuse/neglect since last visito No Secondary Verification Process Completed: Yes Hospitalized since last visit: No Patient Requires Transmission-Based Precautions: No Implantable device outside of the clinic excluding No Patient Has Alerts: Yes cellular tissue based products placed in the center Patient Alerts: Patient on Blood Thinner since last visit: Diabetic Type 2 Pain Present Now: No Eliquis  Electronic Signature(s) Signed: 01/11/2023 10:00:03 AM By: [name] Entered By: [name] on 01/11/2023 09:58:09 -------------------------------------------------------------------------------- Encounter Discharge Information Details Patient Name: Date of Service: [name], [name]. 01/11/2023 7:30 [name] Medical Record Number: [id number] Patient Account Number: [id number] Date of Birth/Sex: Treating RN: 1955-06-15 (67 y.o. M) Primary Care Marilu Rylander: Sampson Scales Other Clinician: [name] Referring Teyanna Thielman: Treating Baileigh Modisette/Extender: RO BSO LOISE, MICHA EL G Entzminger, Scales Duos in  Treatment: 2 Encounter Discharge Information Items Discharge Condition: Stable Ambulatory Status: Ambulatory Discharge Destination: Home Transportation: Private Auto Accompanied By: self Schedule Follow-up Appointment: Yes Clinical Summary of Care: Brian, [name] ([id number]) [id number] Page 2 of 2 Electronic Signature(s) Signed: 01/11/2023 10:00:03 AM By: [name] [place] By: [name] on 01/11/2023 09:59:42 -------------------------------------------------------------------------------- Vitals Details Patient Name: Date of Service: [name], [name]. 01/11/2023 7:30 [name] Medical Record Number: [id number] Patient Account Number: [id number] Date of Birth/Sex: Treating RN: 08-03-1955 (67 y.o. M) Primary Care Rayneisha Bouza: Sampson Scales Other Clinician: [name] Referring Taavi Hoose: Treating Jacquelene Kopecky/Extender: RO BSO N, MICHA EL G Entzminger, Scales Duos in Treatment: 2 Vital Signs Time Taken: 07:30 Temperature (F): 97.8 Height (in): 69 Pulse (bpm): 69 Weight (lbs): 210 Respiratory Rate (breaths/min): 18 Body Mass Index (BMI): 31 Blood Pressure (mmHg): 138/80 Capillary Blood Glucose (mg/dl): 799 Reference Range: 80 - 120 mg / dl Airway Pulse Oximetry (%): 99 Electronic Signature(s) Signed: 01/11/2023 10:00:03 AM By: [name] Entered By: [name] on 01/11/2023 09:58:12

## 2023-01-12 NOTE — Progress Notes (Signed)
 TAKSH, HJORT A ([id number]) [id number] Page 1 of 3 Visit Report for 01/11/2023 HBO Details Patient Name: Date of Service: Brian ORN, [name] 01/11/2023 7:30 A M Medical Record Number: [id number] Patient Account Number: [id number] Date of Birth/Sex: Treating RN: 10/18/1955 (68 y.o. 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, wagoner grade 3 oe Notes: by MRI HBO Treatment Details Treatment Number: 5 Patient Type: Outpatient Chamber Type: Monoplace Chamber Serial #: [id number] Treatment Protocol: 2.0 ATA with 90 minutes oxygen, and no air breaks Treatment Details Compression Rate Down: 2.0 psi / minute De-Compression Rate Up: 1.0 psi / minute Air breaks and breathing Decompress Decompress Compress Tx Pressure Begins Reached periods Begins Ends (leave unused spaces blank) Chamber Pressure (ATA 1 2 ------2 1 ) Clock Time (24 hr) 7:50 8:05 - - - - - - 9:36 9:50 Treatment Length: 120 (minutes) Treatment Segments: 4 Vital Signs Capillary Blood Glucose Reference Range: 80 - 120 mg / dl HBO Diabetic Blood Glucose Intervention Range: <131 mg/dl or >750 mg/dl Time Vitals Blood Respiratory Capillary Blood Glucose Pulse Action Type: Pulse: Temperature: Taken: Pressure: Rate: Glucose (mg/dl): Meter #: Oximetry (%) Taken: Pre 07:30 138/80 69 18 97.8 200 1 99 Post 09:50 120/80 65 18 98 189 1 98 Treatment Response Treatment Toleration: Well Treatment Completion Status: Treatment Completed without Adverse Event Meya Clutter Notes No concerns with treatment given HBO Attestation I certify that I  supervised this HBO treatment in accordance with Medicare guidelines. A trained emergency response team is readily available per Yes hospital policies and procedures. Continue HBOT as ordered. DIXON, LUCZAK A ([id number]) [id number] Page 2 of 3 Electronic Signature(s) Signed: 01/11/2023 4:49:51 PM By: Rufus Sharper MD Previous Signature: 01/11/2023 10:00:03 AM Version By: [name] Entered By: Rufus Sharper on 01/11/2023 14:27:49 -------------------------------------------------------------------------------- HBO Safety Checklist Details Patient Name: Date of Service: Brian ORN, DO [place] A. 01/11/2023 7:30 A M Medical Record Number: [id number] Patient Account Number: [id number] Date of Birth/Sex: Treating RN: 01-08-1956 (68 y.o. M) Primary Care Olson Lucarelli: Sampson Scales Other Clinician: [name] Referring Kalianne Fetting: Treating Kennady Zimmerle/Extender: CLAUDIE ARVELLA SAILOR, MICHA EL G Entzminger, Scales Duos in Treatment: 2 HBO Safety Checklist Items Safety Checklist Consent Form Signed Patient voided / foley secured and emptied When did you last eato 01/11/23 Last dose of injectable or oral agent 01/11/23 Ostomy pouch emptied and vented if applicable NA All implantable devices assessed, documented and approved NA Intravenous access site secured and place NA Valuables secured Linens and cotton and cotton/polyester blend (less than 51% polyester) Personal oil-based products / skin lotions / body lotions removed Wigs or hairpieces removed NA Smoking or tobacco materials removed NA Books / newspapers / magazines / loose paper removed Cologne, aftershave, perfume and deodorant removed Jewelry removed (may wrap wedding band) Make-up removed NA Hair care products removed Battery operated devices (external) removed NA Heating patches and chemical warmers removed NA Titanium eyewear removed NA Nail polish cured greater than 10 hours NA Casting  material cured greater than 10 hours NA Hearing aids removed NA Loose dentures or partials removed NA Prosthetics have been removed NA Patient demonstrates correct use of air break device (if applicable) Patient concerns have been addressed Patient grounding bracelet on  and cord attached to chamber Specifics for Inpatients (complete in addition to above) Medication sheet sent with patient NA Intravenous medications needed or due during therapy sent with patient NA Drainage tubes (e.g. nasogastric tube or chest tube secured and vented) NA Endotracheal or Tracheotomy tube secured NA Cuff deflated of air and inflated with saline NA Airway suctioned NA Electronic Signature(s) Signed: 01/11/2023 10:00:03 AM By: [name] Entered By: [name] on 01/11/2023 09:58:15 Herandez, NANCYANN LABOR ([id number]) [id number] Page 3 of 3

## 2023-01-13 ENCOUNTER — Encounter: Payer: Medicare HMO | Attending: Physician Assistant | Admitting: Physician Assistant

## 2023-01-13 ENCOUNTER — Encounter: Payer: Medicare HMO | Admitting: Physician Assistant

## 2023-01-13 DIAGNOSIS — I119 Hypertensive heart disease without heart failure: Secondary | ICD-10-CM | POA: Insufficient documentation

## 2023-01-13 DIAGNOSIS — E11622 Type 2 diabetes mellitus with other skin ulcer: Secondary | ICD-10-CM | POA: Diagnosis not present

## 2023-01-13 DIAGNOSIS — E11621 Type 2 diabetes mellitus with foot ulcer: Secondary | ICD-10-CM | POA: Insufficient documentation

## 2023-01-13 DIAGNOSIS — I480 Paroxysmal atrial fibrillation: Secondary | ICD-10-CM | POA: Diagnosis not present

## 2023-01-13 DIAGNOSIS — Z7901 Long term (current) use of anticoagulants: Secondary | ICD-10-CM | POA: Insufficient documentation

## 2023-01-13 DIAGNOSIS — I700 Atherosclerosis of aorta: Secondary | ICD-10-CM | POA: Diagnosis not present

## 2023-01-13 DIAGNOSIS — L97512 Non-pressure chronic ulcer of other part of right foot with fat layer exposed: Secondary | ICD-10-CM | POA: Insufficient documentation

## 2023-01-13 DIAGNOSIS — Z950 Presence of cardiac pacemaker: Secondary | ICD-10-CM | POA: Insufficient documentation

## 2023-01-13 DIAGNOSIS — E1151 Type 2 diabetes mellitus with diabetic peripheral angiopathy without gangrene: Secondary | ICD-10-CM | POA: Insufficient documentation

## 2023-01-13 LAB — GLUCOSE, CAPILLARY
Glucose-Capillary: 269 mg/dL — ABNORMAL HIGH (ref 70–99)
Glucose-Capillary: 251 mg/dL — ABNORMAL HIGH (ref 70–99)

## 2023-01-13 NOTE — Progress Notes (Addendum)
 SIRE, POET [name] ([id number]) [id number] Page 1 of 9 Visit Report for 01/13/2023 Chief Complaint Document Details Patient Name: Date of Service: Brian ORN, Brian [place] LD [name]. 01/13/2023 7:30 [name] Medical Record Number: [id number] Patient Account Number: [id number] Date of Birth/Sex: Treating RN: 08-09-1955 (68 y.o. [name] Primary Care Provider: Sampson Scales Other Clinician: Referring Provider: Treating Provider/Extender: Bethena Andre Sampson Scales Weeks in Treatment: 2 Information Obtained from: Patient Chief Complaint Right second toe ulcer Electronic Signature(s) Signed: 01/13/2023 8:03:42 AM By: Bethena Andre PA-C Entered By: Bethena Andre on 01/13/2023 05:03:41 -------------------------------------------------------------------------------- Debridement Details Patient Name: Date of Service: [name], [name]. 01/13/2023 7:30 [name] Medical Record Number: [id number] Patient Account Number: [id number] Date of Birth/Sex: Treating RN: 1955/11/14 (68 y.o. [name] Primary Care Provider: Sampson Scales Other Clinician: Referring Provider: Treating Provider/Extender: Bethena Andre Sampson Scales Weeks in Treatment: 2 Debridement Performed for Assessment: Wound #1 Right,Anterior T Second oe Performed By: Physician Bethena Andre, PA-C The following information was scribed by: Vaughn Mora The information was scribed for: Bethena Andre Debridement Type: Debridement Severity of Tissue Pre Debridement: Fat layer exposed Level of Consciousness (Pre-procedure): Awake and Alert Pre-procedure Verification/Time Out Yes - 08:13 Taken: Pain Control: Lidocaine  4% T opical Solution Percent of Wound Bed Debrided: 100% T Area Debrided (cm): otal 1.26 Tissue and other material debrided: Viable, Non-Viable, Slough, Subcutaneous, Slough Level: Skin/Subcutaneous Tissue Debridement Description: Excisional Instrument: Curette Bleeding:  Moderate Hemostasis Achieved: Pressure JEMAL, MISKELL [name] ([id number]) [id number] Page 2 of 9 Response to Treatment: Procedure was tolerated well Level of Consciousness (Post- Awake and Alert procedure): Post Debridement Measurements of Total Wound Length: (cm) 2 Width: (cm) 0.8 Depth: (cm) 0.1 Volume: (cm) 0.126 Character of Wound/Ulcer Post Debridement: Stable Severity of Tissue Post Debridement: Fat layer exposed Post Procedure Diagnosis Same as Pre-procedure Electronic Signature(s) Signed: 01/13/2023 4:30:42 PM By: Vaughn Mora Signed: 01/13/2023 5:07:17 PM By: Bethena Andre PA-C Entered By: Vaughn Mora on 01/13/2023 05:16:26 -------------------------------------------------------------------------------- HPI Details Patient Name: Date of Service: [name], [name]. 01/13/2023 7:30 [name] Medical Record Number: [id number] Patient Account Number: [id number] Date of Birth/Sex: Treating RN: 12-05-1955 (69 y.o. [name] Primary Care Provider: Sampson Scales Other Clinician: Referring Provider: Treating Provider/Extender: Bethena Andre Sampson Scales Weeks in Treatment: 2 History of Present Illness Chronic/Inactive Conditions Condition 1: 12-27-2022 patient's ABI in the office was 0.92 on the right. With that being said he did have [name] bilateral lower extremity arterial duplex evaluation and this was on December 16, 2022. The result of this showed abnormal left extremity arterial Doppler consistent with stenosis within the distal superficial femoral and popliteal arteries. CTA of the abdominal aorta including lower extremity runoffs is recommended. There was stated to be no significant plaque formation visualized within the arteries of the right lower extremity HPI Description: 12-27-2022 upon evaluation today patient presents for initial inspection here in the clinic as [name] referral from his primary care provider, Dr. Sampson. With that  being said I did discuss with the patient the treatment that is been undertaken up to this point as well as reviewing what I could in epic although I could not see any of the actual PCP notes it sounds as if she has been doing an awesome job with regard to the care of the patient up to this point. He does have osteomyelitis of the second toe right foot. With that being said there is [name] wound here as well there is some muscle/tendon exposed some  of this necrotic as well over the area in question. 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 I Brian believe that he has had excellent care and has been on antibiotics since actually 8 November initially he was placed on it appears doxycycline and then transition to Bactrim which is what he is still on currently. Fortunately he has seen some signs of improvement with regard to the infection which I think is definitely good but I think he still at high risk as far as amputation of the toe is concerned and I discussed that with him today as well. Overall I believe that he is [name] strong candidate for hyperbaric oxygen therapy to try to get his wound to heal. I discussed with the patient how this can increase his chances of healing from around 50% with what we have going on right now to closer to the 85% with HBO therapy. This is obviously [name] fairly significant shift and though not 100% gives him the best chance I think to try to get this wound healed without progressing to further intervention such as amputation. Patient does have [name] history of diabetes mellitus type 2, hypertension, atrial fibrillation, long-term use of anticoagulant therapy, and he does have [name] cardiac pacemaker. I did review notes through epic. He has had [name] recent EKG which did not appear to be significantly different from an EKG even about [name] month ago which was good news. This EKG was performed 12-17-2022 and the when it was compared to was 11-19-2022 and  10-25-2022. Subsequently the patient also has undergone an arterial lower extremity duplex which showed on 12-15-2022 that there was no significant plaque formation visualized within the arteries of the right lower extremity. It would appear that he has sufficient arterial flow to heal although he does have further follow-up with vascular as well. This is mainly in regard to his left lower extremity. He did have an MRI on December 10, 2022 which shows that he does have osteomyelitis which was stated to be acute at that point of the middle phalanx of the second toe right foot. Again he has been on antibiotics since November 19, 2022. 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 I did review the patient's CBC and currently white blood cell count was 9.0 hemoglobin 12.6 hematocrit 37.9 otherwise everything was within normal limits. I did also review the basic metabolic panel which showed that the patient does have elevated creatinine with [name] normal BUN. Estimated GFR was 44. Upon reviewing notes on 12-17-2022 also came across the note where the patient has had an echo on March 05, 2022 which did demonstrated left ventricular ejection fraction estimated to be 60 to 65% with normal function of the left ventricle and no regional wall abnormalities. There was just mild ventricular hypertrophy. Patient's most recent hemoglobin A1c was November 04, 2022 and was at 9.0. 12/27; this is [name] patient with [name] difficult wound over the dorsal surface of his right second toe. He had an MRI on 11/24 showing acute osteomyelitis in the middle of phalanx and either reactive osteitis or early acute osteomyelitis involving the proximal phalanx. He tells me he has completed antibiotics but I am really not JAJUAN, SKOOG [name] ([id number]) [id number] Page 3 of 9 sure of the duration of treatment. I Brian not see any relevant cultures in epic. He started HBO on Monday  and said he had some ear pain in  his first dive but feels better lately although his ears feel somewhat stuffy. No pain We have been using Prisma as the primary dressing. He has [name] surgical shoe that would preclude any pressure or friction on the involved area. The patient had [name] CT angiogram and by [facility] with and without contrast on 12/21. This did not show any acute abnormality within the abdomen or pelvis no hemodynamically significant peripheral arterial disease in either lower extremity. 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 Aortic atherosclerosis but nothing that seems to indicate an adequate flow for healing the wound we are concerned with 01-13-23 upon evaluation today patient appears to be doing well currently in regard to his wound. He is showing signs of improvement and this does seem to be doing quite well. Fortunately I Brian not see any evidence of active infection at this time which is great news. Electronic Signature(s) Signed: 01/13/2023 8:27:05 AM By: Bethena Ferraris PA-C Entered By: Bethena Ferraris on 01/13/2023 05:27:05 -------------------------------------------------------------------------------- Physical Exam Details Patient Name: Date of Service: [name], [name]. 01/13/2023 7:30 [name] Medical Record Number: [id number] Patient Account Number: [id number] Date of Birth/Sex: Treating RN: 01-Oct-1955 (68 y.o. [name] Primary Care Provider: Sampson Scales Other Clinician: Referring Provider: Treating Provider/Extender: Bethena Ferraris Sampson Scales Weeks in Treatment: 2 Constitutional Well-nourished and well-hydrated in no acute distress. Respiratory normal breathing without difficulty. Psychiatric this patient is able to make decisions and demonstrates good insight into disease process. Alert and Oriented x 3. pleasant and cooperative. Notes Upon inspection patient's wound bed actually showed signs of good granulation and epithelization at  this point. Fortunately I Brian not see any signs of worsening overall and I believe that the patient is doing quite well. Electronic Signature(s) Signed: 01/13/2023 8:27:17 AM By: Bethena Ferraris PA-C Entered By: Bethena Ferraris on 01/13/2023 05:27:17 -------------------------------------------------------------------------------- Physician Orders Details Patient Name: Date of Service: [name], [name]. 01/13/2023 7:30 [name] Medical Record Number: [id number] Patient Account Number: [id number] Date of Birth/Sex: Treating RN: 1955-04-08 (68 y.o. [name] Primary Care Provider: Sampson Scales Other Clinician: MYKEL, SPONAUGLE ([id number]) [id number] Page 4 of 9 Referring Provider: Treating Provider/Extender: Bethena Ferraris Sampson, Scales Duos in Treatment: 2 The following information was scribed by: Vaughn Mora The information was scribed for: Bethena Ferraris Verbal / Phone Orders: No Diagnosis Coding ICD-10 Coding Code Description E11.621 Type 2 diabetes mellitus with foot ulcer L97.512 Non-pressure chronic ulcer of other part of right foot with fat layer exposed I10 Essential (primary) hypertension I48.0 Paroxysmal atrial fibrillation Z79.01 Long term (current) use of anticoagulants Z95.0 Presence of cardiac pacemaker Follow-up Appointments Return Appointment in 1 week. Bathing/ Shower/ Hygiene May shower; gently cleanse wound with antibacterial soap, rinse and pat dry prior to dressing wounds Anesthetic (Use 'Patient Medications' Section for Anesthetic Order Entry) Lidocaine  applied to wound bed Hyperbaric Oxygen Therapy Wound #1 Right,Anterior T Second oe Evaluate for HBO Therapy Indication and location: - Right 2nd T Ulcer, wagoner grade 3 by MRI oe If appropriate for treatment, begin HBOT per protocol: 2.0 ATA for 90 Minutes without [name] Breaks ir One treatment per day (delivered Monday through Friday unless otherwise specified in Special  Instructions below): Total # of Treatments: - 40 [name] ntihistamine 30 minutes prior to HBO Treatment, difficulty clearing ears. Finger stick Blood Glucose Pre- and Post- HBOT Treatment. Follow Hyperbaric Oxygen Glycemia Protocol Wound Treatment Wound #1 - T Second oe Wound Laterality: Right, Anterior Cleanser: Byram Ancillary Kit -  15 Day Supply (Generic) Every Other Day/30 Days Discharge Instructions: Use supplies as instructed; Kit contains: (15) Saline Bullets; (15) 3x3 Gauze; 15 pr Gloves Cleanser: Soap and Water Every Other Day/30 Days Discharge Instructions: Gently cleanse wound with antibacterial soap, rinse and pat dry prior to dressing wounds Cleanser: Vashe 5.8 (oz) Every Other Day/30 Days Discharge Instructions: Use vashe 5.8 (oz) as directed Prim Dressing: Prisma 4.34 (in) (Generic) Every Other Day/30 Days ary Discharge Instructions: Moisten w/normal saline or sterile water; Cover wound as directed. Brian not remove from wound bed. Prim Dressing: Xeroform-HBD 2x2 (in/in) Every Other Day/30 Days ary Discharge Instructions: Apply Xeroform-HBD 2x2 (in/in) as directed Secured With: Conform 2''- Conforming Stretch Gauze Bandage 2x75 (in/in) Every Other Day/30 Days Discharge Instructions: Apply as directed Secured With: Stretch Net Dressing, Latex-free, Size 5, Small-Head / Shoulder / Thigh Every Other Day/30 Days Discharge Instructions: size 2 used GLYCEMIA INTERVENTIONS PROTOCOL PRE-HBO GLYCEMIA INTERVENTIONS ACTION INTERVENTION Obtain pre-HBO capillary blood glucose (ensure 1 physician order is in chart). [name]. Notify HBO physician and await physician orders. 2 If result is 70 mg/dl or below: B. If the result meets the hospital definition of [name] critical result, follow hospital policy. [name]. Give patient an 8 ounce Glucerna Shake, an 8 ounce Ensure, or 8 ounces of [name] Glucerna/Ensure equivalent dietary supplement*. B. Wait 30 minutes. GEORGIO, HATTABAUGH [name] ([id number])  [id number] Page 5 of 9 If result is 71 mg/dl to 869 mg/dl: C. Retest patients capillary blood glucose (CBG). D. If result greater than or equal to 110 mg/dl, proceed with HBO. If result less than 110 mg/dl, notify HBO physician and consider holding HBO. If result is 131 mg/dl to 750 mg/dl: [name]. Proceed with HBO. [name]. Notify HBO physician and await physician orders. B. It is recommended to hold HBO and Brian If result is 250 mg/dl or greater: blood/urine ketone testing. C. If the result meets the hospital definition of [name] critical result, follow hospital policy. POST-HBO GLYCEMIA INTERVENTIONS ACTION INTERVENTION Obtain post HBO capillary blood glucose (ensure 1 physician order is in chart). [name]. Notify HBO physician and await physician orders. 2 If result is 70 mg/dl or below: B. If the result meets the hospital definition of [name] critical result, follow hospital policy. [name]. Give patient an 8 ounce Glucerna Shake, an 8 ounce Ensure, or 8 ounces of [name] Glucerna/Ensure equivalent dietary supplement*. B. Wait 15 minutes for symptoms of If result is 71 mg/dl to 899 mg/dl: hypoglycemia (i.e. nervousness, anxiety, sweating, chills, clamminess, irritability, confusion, tachycardia or dizziness). C. If patient asymptomatic, discharge patient. If patient symptomatic, repeat capillary blood glucose (CBG) and notify HBO physician. If result is 101 mg/dl to 750 mg/dl: [name]. Discharge patient. [name]. Notify HBO physician and await physician orders. B. It is recommended to Brian blood/urine ketone If result is 250 mg/dl or greater: testing. C. If the result meets the hospital definition of [name] critical result, follow hospital policy. *Juice or candies are NOT equivalent products. If patient refuses the Glucerna or Ensure, please consult the hospital dietitian for an appropriate substitute. Electronic Signature(s) Signed: 01/13/2023 4:30:42 PM By: Vaughn Mora Signed: 01/13/2023  5:07:17 PM By: Bethena Ferraris PA-C Entered By: Vaughn Mora on 01/13/2023 05:17:11 -------------------------------------------------------------------------------- Problem List Details Patient Name: Date of Service: [name], [name]. 01/13/2023 7:30 [name] Medical Record Number: [id number] Patient Account Number: [id number] Date of Birth/Sex: Treating RN: [birth year] (68 y.o. [name] Primary Care Provider: Sampson Scales Other Clinician: Referring Provider: Treating Provider/Extender: Bethena Ferraris Sampson Scales  Weeks in Treatment: 2 Active Problems ICD-10 Encounter Code Description Active Date MDM Diagnosis E11.621 Type 2 diabetes mellitus with foot ulcer 12/27/2022 No Yes L97.512 Non-pressure chronic ulcer of other part of right foot with fat layer exposed 12/27/2022 No Yes KIERON, KANTNER [name] ([id number]) [id number].pdf Page 6 of 9 I10 Essential (primary) hypertension 12/27/2022 No Yes I48.0 Paroxysmal atrial fibrillation 12/27/2022 No Yes Z79.01 Long term (current) use of anticoagulants 12/27/2022 No Yes Z95.0 Presence of cardiac pacemaker 12/27/2022 No Yes Inactive Problems Resolved Problems Electronic Signature(s) Signed: 01/13/2023 8:03:32 AM By: Bethena Ferraris PA-C Entered By: Bethena Ferraris on 01/13/2023 05:03:32 -------------------------------------------------------------------------------- Progress Note Details Patient Name: Date of Service: [name], [name]. 01/13/2023 7:30 [name] Medical Record Number: [id number] Patient Account Number: [id number] Date of Birth/Sex: Treating RN: [birth year] (68 y.o. [name] Primary Care Provider: Sampson Scales Other Clinician: Referring Provider: Treating Provider/Extender: Bethena Ferraris Sampson Scales Weeks in Treatment: 2 Subjective Chief Complaint Information obtained from Patient Right second toe ulcer History of Present Illness (HPI) Chronic/Inactive  Condition: 12-27-2022 patient's ABI in the office was 0.92 on the right. With that being said he did have [name] bilateral lower extremity arterial duplex evaluation and this was on December 16, 2022. The result of this showed abnormal left extremity arterial Doppler consistent with stenosis within the distal superficial femoral and popliteal arteries. CTA of the abdominal aorta including lower extremity runoffs is recommended. There was stated to be no significant plaque formation visualized within the arteries of the right lower extremity 12-27-2022 upon evaluation today patient presents for initial inspection here in the clinic as [name] referral from his primary care provider, Dr. Sampson. With that being said I did discuss with the patient the treatment that is been undertaken up to this point as well as reviewing what I could in epic although I could not see any of the actual PCP notes it sounds as if she has been doing an awesome job with regard to the care of the patient up to this point. He does have osteomyelitis of the second toe right foot. With that being said there is [name] wound here as well there is some muscle/tendon exposed some of this necrotic as well over the area in question. 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 I Brian believe that he has had excellent care and has been on antibiotics since actually 8 November initially he was placed on it appears doxycycline and then transition to Bactrim which is what he is still on currently. Fortunately he has seen some signs of improvement with regard to the infection which I think is definitely good but I think he still at high risk as far as amputation of the toe is concerned and I discussed that with him today as well. Overall I believe that he is [name] strong candidate for hyperbaric oxygen therapy to try to get his wound to heal. I discussed with the patient how this can increase his chances of healing  from around 50% with what we have going on right now to closer to the 85% with HBO therapy. This is obviously [name] fairly significant shift and though not 100% gives him the best chance I think to try to get this wound healed without progressing to further intervention such as amputation. Patient does have [name] history of diabetes mellitus type 2, hypertension, atrial fibrillation, long-term use of anticoagulant therapy, and he does have [name] cardiac pacemaker. Donoghue, Deaaron  [name] ([id number]) [id number] Page 7 of 9 I did review notes through epic. He has had [name] recent EKG which did not appear to be significantly different from an EKG even about [name] month ago which was good news. This EKG was performed 12-17-2022 and the when it was compared to was 11-19-2022 and 10-25-2022. Subsequently the patient also has undergone an arterial lower extremity duplex which showed on 12-15-2022 that there was no significant plaque formation visualized within the arteries of the right lower extremity. It would appear that he has sufficient arterial flow to heal although he does have further follow-up with vascular as well. This is mainly in regard to his left lower extremity. He did have an MRI on December 10, 2022 which shows that he does have osteomyelitis which was stated to be acute at that point of the middle phalanx of the second toe right foot. Again he has been on antibiotics since November 19, 2022. 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 I did review the patient's CBC and currently white blood cell count was 9.0 hemoglobin 12.6 hematocrit 37.9 otherwise everything was within normal limits. I did also review the basic metabolic panel which showed that the patient does have elevated creatinine with [name] normal BUN. Estimated GFR was 44. Upon reviewing notes on 12-17-2022 also came across the note where the patient has had an echo on March 05, 2022  which did demonstrated left ventricular ejection fraction estimated to be 60 to 65% with normal function of the left ventricle and no regional wall abnormalities. There was just mild ventricular hypertrophy. Patient's most recent hemoglobin A1c was November 04, 2022 and was at 9.0. 12/27; this is [name] patient with [name] difficult wound over the dorsal surface of his right second toe. He had an MRI on 11/24 showing acute osteomyelitis in the middle of phalanx and either reactive osteitis or early acute osteomyelitis involving the proximal phalanx. He tells me he has completed antibiotics but I am really not sure of the duration of treatment. I Brian not see any relevant cultures in epic. He started HBO on Monday and said he had some ear pain in his first dive but feels better lately although his ears feel somewhat stuffy. No pain We have been using Prisma as the primary dressing. He has [name] surgical shoe that would preclude any pressure or friction on the involved area. The patient had [name] CT angiogram and by [facility] with and without contrast on 12/21. This did not show any acute abnormality within the abdomen or pelvis no hemodynamically significant peripheral arterial disease in either lower extremity. 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 Aortic atherosclerosis but nothing that seems to indicate an adequate flow for healing the wound we are concerned with 01-13-23 upon evaluation today patient appears to be doing well currently in regard to his wound. He is showing signs of improvement and this does seem to be doing quite well. Fortunately I Brian not see any evidence of active infection at this time which is great news. Objective Constitutional Well-nourished and well-hydrated in no acute distress. Vitals Time Taken: 7:45 AM, Height: 69 in, Weight: 210 lbs, BMI: 31, Temperature: 97.5 F, Pulse: 71 bpm, Respiratory Rate: 18 breaths/min, Blood Pressure: 161/84 mmHg. Respiratory normal  breathing without difficulty. Psychiatric this patient is able to make decisions and demonstrates good insight into disease process. Alert and Oriented x 3. pleasant and cooperative. General Notes: Upon inspection patient's wound  bed actually showed signs of good granulation and epithelization at this point. Fortunately I Brian not see any signs of worsening overall and I believe that the patient is doing quite well. Integumentary (Hair, Skin) Wound #1 status is Open. Original cause of wound was Gradually Appeared. The date acquired was: 11/27/2022. The wound has been in treatment 2 weeks. The wound is located on the Right,Anterior T Second. The wound measures 2cm length x 0.8cm width x 0.1cm depth; 1.257cm^2 area and 0.126cm^3 volume. oe There is Fat Layer (Subcutaneous Tissue) exposed. There is no tunneling or undermining noted. There is [name] medium amount of serosanguineous drainage noted. There is medium (34-66%) pink granulation within the wound bed. There is [name] medium (34-66%) amount of necrotic tissue within the wound bed including Eschar and Adherent Slough. Assessment Active Problems ICD-10 Type 2 diabetes mellitus with foot ulcer Non-pressure chronic ulcer of other part of right foot with fat layer exposed Essential (primary) hypertension Paroxysmal atrial fibrillation Long term (current) use of anticoagulants Presence of cardiac pacemaker Procedures Wound #1 Pre-procedure diagnosis of Wound #1 is [name] Diabetic Wound/Ulcer of the Lower Extremity located on the Right,Anterior T Second .Severity of Tissue Pre oe Debridement is: Fat layer exposed. There was [name] Excisional Skin/Subcutaneous Tissue Debridement with [name] total area of 1.26 sq cm performed by Bethena Ferraris, PA-C. With the following instrument(s): Curette to remove Viable and Non-Viable tissue/material. Material removed includes Subcutaneous Tissue and Slough and after achieving pain control using Lidocaine  4% Topical Solution. No  specimens were taken. [name] time out was conducted at 08:13, prior to the start of the procedure. [name] Moderate amount of bleeding was controlled with Pressure. The procedure was tolerated well. Post Debridement Measurements: 2cm length x JENSYN, CAMBRIA [name] ([id number]) [id number] Page 8 of 9 0.8cm width x 0.1cm depth; 0.126cm^3 volume. Character of Wound/Ulcer Post Debridement is stable. Severity of Tissue Post Debridement is: Fat layer exposed. Post procedure Diagnosis Wound #1: Same as Pre-Procedure Plan Follow-up Appointments: Return Appointment in 1 week. Bathing/ Shower/ Hygiene: May shower; gently cleanse wound with antibacterial soap, rinse and pat dry prior to dressing wounds Anesthetic (Use 'Patient Medications' Section for Anesthetic Order Entry): Lidocaine  applied to wound bed Hyperbaric Oxygen Therapy: Wound #1 Right,Anterior T Second: oe Evaluate for HBO Therapy Indication and location: - Right 2nd T Ulcer, wagoner grade 3 by MRI oe If appropriate for treatment, begin HBOT per protocol: 2.0 ATA for 90 Minutes without Air Breaks One treatment per day (delivered Monday through Friday unless otherwise specified in Special Instructions below): T # of Treatments: - 40 otal Antihistamine 30 minutes prior to HBO Treatment, difficulty clearing ears. Finger stick Blood Glucose Pre- and Post- HBOT Treatment. Follow Hyperbaric Oxygen Glycemia Protocol WOUND #1: - T Second Wound Laterality: Right, Anterior oe Cleanser: Byram Ancillary Kit - 15 Day Supply (Generic) Every Other Day/30 Days Discharge Instructions: Use supplies as instructed; Kit contains: (15) Saline Bullets; (15) 3x3 Gauze; 15 pr Gloves Cleanser: Soap and Water Every Other Day/30 Days Discharge Instructions: Gently cleanse wound with antibacterial soap, rinse and pat dry prior to dressing wounds Cleanser: Vashe 5.8 (oz) Every Other Day/30 Days Discharge Instructions: Use vashe 5.8 (oz) as  directed Prim Dressing: Prisma 4.34 (in) (Generic) Every Other Day/30 Days ary Discharge Instructions: Moisten w/normal saline or sterile water; Cover wound as directed. Brian not remove from wound bed. Prim Dressing: Xeroform-HBD 2x2 (in/in) Every Other Day/30 Days ary Discharge Instructions: Apply Xeroform-HBD 2x2 (in/in) as directed Secured With: Conform 2''- Conforming Stretch  Gauze Bandage 2x75 (in/in) Every Other Day/30 Days Discharge Instructions: Apply as directed Secured With: Stretch Net Dressing, Latex-free, Size 5, Small-Head / Shoulder / Thigh Every Other Day/30 Days Discharge Instructions: size 2 used 1. I would recommend that we have the patient continue to monitor for any signs of infection or worsening. Based on what I am seeing I Brian believe that her making excellent headway here towards closure which is great news. 2. I am good recommend that we continue with the collagen we will use some of the Xeroform gauze over top of the collagen to try to keep it from drying out as we were having an issue with this drying out quite significantly. 3. I am going to recommend the patient should continue with hyperbaric oxygen therapy which I think has been beneficial he is slowly seeing signs of improvement as we proceed week by week. I am hopeful we will get this closed and prevent any additional need for amputation. We will see patient back for reevaluation in 1 week here in the clinic. If anything worsens or changes patient will contact our office for additional recommendations. Electronic Signature(s) Signed: 01/13/2023 8:28:08 AM By: Bethena Ferraris PA-C Entered By: Bethena Ferraris on 01/13/2023 05:28:08 -------------------------------------------------------------------------------- SuperBill Details Patient Name: Date of Service: [name], [name]. 01/13/2023 Medical Record Number: [id number] Patient Account Number: [id number] Date of Birth/Sex: Treating RN: 1955/04/07 (68 y.o. [name] Primary Care Provider: Sampson Scales Other Clinician: Referring Provider: Treating Provider/Extender: Bethena Ferraris Sampson Scales Devra in Treatment: 2 ATHENS, LEBEAU [name] ([id number]) [id number] Page 9 of 9 Diagnosis Coding ICD-10 Codes Code Description E11.621 Type 2 diabetes mellitus with foot ulcer L97.512 Non-pressure chronic ulcer of other part of right foot with fat layer exposed I10 Essential (primary) hypertension I48.0 Paroxysmal atrial fibrillation Z79.01 Long term (current) use of anticoagulants Z95.0 Presence of cardiac pacemaker Facility Procedures : CPT4 Code: [id number] Description: 11042 - DEB SUBQ TISSUE 20 SQ CM/< ICD-10 Diagnosis Description L97.512 Non-pressure chronic ulcer of other part of right foot with fat layer exposed Modifier: Quantity: 1 Physician Procedures : CPT4 Code Description Modifier 11042 11042 - WC PHYS SUBQ TISS 20 SQ CM ICD-10 Diagnosis Description L97.512 Non-pressure chronic ulcer of other part of right foot with fat layer exposed Quantity: 1 Electronic Signature(s) Signed: 01/13/2023 8:28:14 AM By: Bethena Ferraris PA-C Entered By: Bethena Ferraris on 01/13/2023 94:71:85

## 2023-01-13 NOTE — Progress Notes (Signed)
 DOAK, MAH [name] ([id number]) [id number] Page 1 of 9 Visit Report for 01/13/2023 Arrival Information Details Patient Name: Date of Service: [name], [name]. 01/13/2023 7:30 [name] Medical Record Number: [id number] Patient Account Number: [id number] Date of Birth/Sex: Treating RN: 06-21-1955 (68 y.o. [name] Primary Care Wildon Cuevas: Sampson Scales Other Clinician: Referring Yvan Dority: Treating Cristiano Capri/Extender: Bethena Andre Sampson Scales Devra in Treatment: 2 Visit Information History Since Last Visit Added or deleted any medications: No Patient Arrived: Ambulatory Any new allergies or adverse reactions: No Arrival Time: 07:45 Had [name] fall or experienced change in No Accompanied By: self activities of daily living that may affect Transfer Assistance: None risk of falls: Patient Identification Verified: Yes Hospitalized since last visit: No Secondary Verification Process Completed: Yes Has Dressing in Place as Prescribed: Yes Patient Requires Transmission-Based Precautions: No Pain Present Now: No Patient Has Alerts: Yes Patient Alerts: Patient on Blood Thinner Diabetic Type 2 Eliquis  Electronic Signature(s) Signed: 01/13/2023 4:30:42 PM By: Vaughn Mora Entered By: Vaughn Mora on 01/13/2023 07:45:23 -------------------------------------------------------------------------------- Clinic Level of Care Assessment Details Patient Name: Date of Service: Brian ORN, Brian NA LD [name]. 01/13/2023 7:30 [name] Medical Record Number: [id number] Patient Account Number: [id number] Date of Birth/Sex: Treating RN: 1955/12/03 (68 y.o. [name] Primary Care Kristee Angus: Sampson Scales Other Clinician: Referring Almando Brawley: Treating Wil Slape/Extender: Bethena Andre Sampson Scales Devra in Treatment: 2 Clinic Level of Care Assessment Items TOOL 1 Quantity Score []  - 0 Use when EandM and Procedure is performed on INITIAL visit ASSESSMENTS -  Nursing Assessment / Reassessment []  - 0 General Physical Exam (combine w/ comprehensive assessment (listed just below) when performed on new pt. evals) []  - 0 Comprehensive Assessment (HX, ROS, Risk Assessments, Wounds Hx, etc.) ASSESSMENTS - Wound and Skin Assessment / Reassessment []  - 0 Dermatologic / Skin Assessment (not related to wound area) MARCAS, BOWSHER [name] ([id number]) [phone].pdf Page 2 of 9 ASSESSMENTS - Ostomy and/or Continence Assessment and Care []  - 0 Incontinence Assessment and Management []  - 0 Ostomy Care Assessment and Management (repouching, etc.) PROCESS - Coordination of Care []  - 0 Simple Patient / Family Education for ongoing care []  - 0 Complex (extensive) Patient / Family Education for ongoing care []  - 0 Staff obtains Chiropractor, Records, T Results / Process Orders est []  - 0 Staff telephones HHA, Nursing Homes / Clarify orders / etc []  - 0 Routine Transfer to another Facility (non-emergent condition) []  - 0 Routine Hospital Admission (non-emergent condition) []  - 0 New Admissions / Manufacturing Engineer / Ordering NPWT Apligraf, etc. , []  - 0 Emergency Hospital Admission (emergent condition) PROCESS - Special Needs []  - 0 Pediatric / Minor Patient Management []  - 0 Isolation Patient Management []  - 0 Hearing / Language / Visual special needs []  - 0 Assessment of Community assistance (transportation, D/C planning, etc.) []  - 0 Additional assistance / Altered mentation []  - 0 Support Surface(s) Assessment (bed, cushion, seat, etc.) INTERVENTIONS - Miscellaneous []  - 0 External ear exam []  - 0 Patient Transfer (multiple staff / Nurse, Adult / Similar devices) []  - 0 Simple Staple / Suture removal (25 or less) []  - 0 Complex Staple / Suture removal (26 or more) []  - 0 Hypo/Hyperglycemic Management (Brian not check if billed separately) []  - 0 Ankle / Brachial Index (ABI) - Brian not check if billed separately Has  the patient been seen at the hospital within the last three years: Yes Total Score: 0 Level Of Care: ____ Electronic Signature(s) Signed: 01/13/2023  4:30:42 PM By: Vaughn Mora Entered By: Vaughn Mora on 01/13/2023 08:24:27 -------------------------------------------------------------------------------- Encounter Discharge Information Details Patient Name: Date of Service: Brian ORN, Brian NA LD [name]. 01/13/2023 7:30 [name] Medical Record Number: [id number] Patient Account Number: [id number] Date of Birth/Sex: Treating RN: 02/04/1955 (68 y.o. [name] Primary Care Young Brim: Sampson Scales Other Clinician: Referring Mcihael Hinderman: Treating Dilpreet Faires/Extender: Bethena Andre Sampson Scales Devra in Treatment: 2 Encounter Discharge Information Items Post Procedure JACADEN, FORBUSH [name] ([id number]) [id number] Page 3 of 9 Discharge Condition: Stable Temperature (F): 97.5 Ambulatory Status: Ambulatory Pulse (bpm): 71 Discharge Destination: Home Respiratory Rate (breaths/min): 18 Transportation: Private Auto Blood Pressure (mmHg): 161/84 Accompanied By: self Schedule Follow-up Appointment: Yes Clinical Summary of Care: Electronic Signature(s) Signed: 01/13/2023 4:30:42 PM By: Vaughn Mora Entered By: Vaughn Mora on 01/13/2023 08:27:14 -------------------------------------------------------------------------------- Lower Extremity Assessment Details Patient Name: Date of Service: Brian ORN, Brian NA LD [name]. 01/13/2023 7:30 [name] Medical Record Number: [id number] Patient Account Number: [id number] Date of Birth/Sex: Treating RN: 1955/07/31 (68 y.o. [name] Primary Care Jeran Hiltz: Sampson Scales Other Clinician: Referring Cher Egnor: Treating Bowen Goyal/Extender: Bethena Andre Sampson Scales Weeks in Treatment: 2 Edema Assessment Assessed: [Left: No] [Right: No] Edema: [Left: N] [Right: o] Vascular Assessment Pulses: Dorsalis  Pedis Palpable: [Left:Yes] Posterior Tibial Palpable: [Left:Yes] Extremity colors, hair growth, and conditions: Extremity Color: [Left:Normal] Hair Growth on Extremity: [Left:Yes] Temperature of Extremity: [Left:Warm < 3 seconds] Toe Nail Assessment Left: Right: Thick: Yes Discolored: Yes Deformed: No Improper Length and Hygiene: No Electronic Signature(s) Signed: 01/13/2023 4:30:42 PM By: Vaughn Mora Entered By: Vaughn Mora on 01/13/2023 07:48:50 Koenig, Connery [name] ([id number]) [id number] Page 4 of 9 -------------------------------------------------------------------------------- Multi Wound Chart Details Patient Name: Date of Service: Brian ORN, Brian NA LD [name]. 01/13/2023 7:30 [name] Medical Record Number: [id number] Patient Account Number: [id number] Date of Birth/Sex: Treating RN: 1955/07/21 (68 y.o. [name] Primary Care Junie Avilla: Sampson Scales Other Clinician: Referring Abisai Coble: Treating Harshini Trent/Extender: Bethena Andre Sampson Scales Weeks in Treatment: 2 Vital Signs Height(in): 69 Pulse(bpm): 71 Weight(lbs): 210 Blood Pressure(mmHg): 161/84 Body Mass Index(BMI): 31 Temperature(F): 97.5 Respiratory Rate(breaths/min): 18 [1:Photos:] [N/[name]:N/[name]] Right, Anterior T Second oe N/[name] N/[name] Wound Location: Gradually Appeared N/[name] N/[name] Wounding Event: Diabetic Wound/Ulcer of the Lower N/[name] N/[name] Primary Etiology: Extremity Hypertension, Type II Diabetes N/[name] N/[name] Comorbid History: 11/27/2022 N/[name] N/[name] Date Acquired: 2 N/[name] N/[name] Weeks of Treatment: Open N/[name] N/[name] Wound Status: No N/[name] N/[name] Wound Recurrence: 2x0.8x0.1 N/[name] N/[name] Measurements L x W x D (cm) 1.257 N/[name] N/[name] [name] (cm) : rea 0.126 N/[name] N/[name] Volume (cm) : 33.30% N/[name] N/[name] % Reduction in [name] rea: 66.60% N/[name] N/[name] % Reduction in Volume: Grade 3 N/[name] N/[name] Classification: Medium N/[name] N/[name] Exudate [name] mount: Serosanguineous N/[name] N/[name] Exudate Type: red, brown N/[name] N/[name] Exudate Color: Medium  (34-66%) N/[name] N/[name] Granulation [name] mount: Pink N/[name] N/[name] Granulation Quality: Medium (34-66%) N/[name] N/[name] Necrotic [name] mount: Eschar, Adherent Slough N/[name] N/[name] Necrotic Tissue: Fat Layer (Subcutaneous Tissue): Yes N/[name] N/[name] Exposed Structures: Fascia: No Tendon: No Muscle: No Joint: No Bone: No None N/[name] N/[name] Epithelialization: Treatment Notes Electronic Signature(s) Signed: 01/13/2023 4:30:42 PM By: Vaughn Mora Entered By: Vaughn Mora on 01/13/2023 08:13:18 WRIGHT GOLAS [name] ([id number]) [id number] Page 5 of 9 -------------------------------------------------------------------------------- Multi-Disciplinary Care Plan Details Patient Name: Date of Service: [name], [name]. 01/13/2023 7:30 [name] Medical Record Number: [id number] Patient Account Number: [id number] Date of Birth/Sex: Treating RN: 26-Jan-1955 (68 y.o. [name] Primary Care Anastasija Anfinson: Sampson Scales Other Clinician:  Referring Copeland Lapier: Treating Miquel Lamson/Extender: Bethena Andre Matsu, Ethridge Duos in Treatment: 2 Active Inactive Necrotic Tissue Nursing Diagnoses: Impaired tissue integrity related to necrotic/devitalized tissue Knowledge deficit related to management of necrotic/devitalized tissue Goals: Necrotic/devitalized tissue will be minimized in the wound bed Date Initiated: 12/27/2022 Target Resolution Date: 01/27/2023 Goal Status: Active Patient/caregiver will verbalize understanding of reason and process for debridement of necrotic tissue Date Initiated: 12/27/2022 Target Resolution Date: 01/27/2023 Goal Status: Active Interventions: Assess patient pain level pre-, during and post procedure and prior to discharge Provide education on necrotic tissue and debridement process Treatment Activities: Apply topical anesthetic as ordered : 12/27/2022 Excisional debridement : 12/27/2022 Notes: Osteomyelitis Nursing Diagnoses: Infection: osteomyelitis Knowledge deficit  related to disease process and management Goals: Diagnostic evaluation for osteomyelitis completed as ordered Date Initiated: 12/27/2022 Date Inactivated: 01/13/2023 Target Resolution Date: 01/11/2023 Goal Status: Met Patient/caregiver will verbalize understanding of disease process and disease management Date Initiated: 12/27/2022 Date Inactivated: 01/07/2023 Target Resolution Date: 01/03/2023 Goal Status: Met Patient's osteomyelitis will resolve Date Initiated: 12/27/2022 Target Resolution Date: 02/27/2023 Goal Status: Active Signs and symptoms for osteomyelitis will be recognized and promptly addressed Date Initiated: 12/27/2022 Target Resolution Date: 01/11/2023 Goal Status: Active Interventions: Provide education on osteomyelitis Treatment Activities: Consult for HBO : 12/27/2022 MRI : 12/17/2022 T ordered outside of clinic : 12/27/2022 est White Blood Cell Scan : 12/17/2022 AUDON, HEYMANN ([id number]) [phone].pdf Page 6 of 9 Notes: Wound/Skin Impairment Nursing Diagnoses: Impaired tissue integrity Knowledge deficit related to ulceration/compromised skin integrity Goals: Patient/caregiver will verbalize understanding of skin care regimen Date Initiated: 12/27/2022 Target Resolution Date: 01/27/2023 Goal Status: Active Ulcer/skin breakdown will have [name] volume reduction of 30% by week 4 Date Initiated: 12/27/2022 Target Resolution Date: 01/27/2023 Goal Status: Active Ulcer/skin breakdown will have [name] volume reduction of 50% by week 8 Date Initiated: 12/27/2022 Target Resolution Date: 02/27/2023 Goal Status: Active Ulcer/skin breakdown will have [name] volume reduction of 80% by week 12 Date Initiated: 12/27/2022 Target Resolution Date: 03/27/2023 Goal Status: Active Ulcer/skin breakdown will heal within 14 weeks Date Initiated: 12/27/2022 Target Resolution Date: 04/10/2023 Goal Status: Active Interventions: Assess patient/caregiver ability to  obtain necessary supplies Assess patient/caregiver ability to perform ulcer/skin care regimen upon admission and as needed Assess ulceration(s) every visit Provide education on ulcer and skin care Screen for HBO Treatment Activities: Consult for HBO : 12/27/2022 Referred to DME [facility] for dressing supplies : 12/27/2022 Skin care regimen initiated : 12/27/2022 Notes: Electronic Signature(s) Signed: 01/13/2023 4:30:42 PM By: Vaughn Mora Entered By: Vaughn Mora on 01/13/2023 08:25:19 -------------------------------------------------------------------------------- Pain Assessment Details Patient Name: Date of Service: Brian ORN, Brian NA LD [name]. 01/13/2023 7:30 [name] Medical Record Number: [id number] Patient Account Number: [id number] Date of Birth/Sex: Treating RN: [birth year] (68 y.o. [name] Primary Care Micaila Ziemba: Matsu Ethridge Other Clinician: Referring Allyn Bartelson: Treating Jatorian Renault/Extender: Bethena Andre Matsu Ethridge Weeks in Treatment: 2 Active Problems Location of Pain Severity and Description of Pain Patient Has Paino No Site Locations [place] [name] ([id number]) [id number] Page 7 of 9 Pain Management and Medication Current Pain Management: Electronic Signature(s) Signed: 01/13/2023 4:30:42 PM By: Vaughn Mora Entered By: Vaughn Mora on 01/13/2023 07:45:43 -------------------------------------------------------------------------------- Patient/Caregiver Education Details Patient Name: Date of Service: Brian ORN, Brian NA LD [name]. 1/2/2025andnbsp7:30 [name] Medical Record Number: [id number] Patient Account Number: [id number] Date of Birth/Gender: Treating RN: 08-17-1955 (68 y.o. [name] Primary Care Physician: Matsu Ethridge Other Clinician: Referring Physician: Treating Physician/Extender: Bethena Andre Matsu Ethridge Duos in Treatment: 2 Education Assessment Education Provided  To: Patient Education  Topics Provided Infection: Handouts: Hygiene and Infection Prevention Methods: Explain/Verbal Responses: State content correctly Wound Debridement: Handouts: Wound Debridement Methods: Explain/Verbal Responses: State content correctly Wound/Skin Impairment: Handouts: Caring for Your Ulcer Methods: Explain/Verbal Responses: State content correctly Electronic Signature(s) Signed: 01/13/2023 4:30:42 PM By: Vaughn Reche WRIGHT NANCYANN DELENA ([id number]) By: Vaughn Reche [phone].pdf Page 8 of 9 Signed: 01/13/2023 4:30:42 PM Entered By: Vaughn Reche on 01/13/2023 08:25:40 -------------------------------------------------------------------------------- Wound Assessment Details Patient Name: Date of Service: Brian ORN, Brian NA LD [name]. 01/13/2023 7:30 [name] Medical Record Number: [id number] Patient Account Number: [id number] Date of Birth/Sex: Treating RN: [birth year] (68 y.o. NETTY Vaughn Reche Primary Care Dusan Lipford: Sampson Scales Other Clinician: Referring Alec Mcphee: Treating Jaelyn Cloninger/Extender: Bethena Andre Sampson Scales Weeks in Treatment: 2 Wound Status Wound Number: 1 Primary Etiology: Diabetic Wound/Ulcer of the Lower Extremity Wound Location: Right, Anterior T Second oe Wound Status: Open Wounding Event: Gradually Appeared Comorbid History: Hypertension, Type II Diabetes Date Acquired: 11/27/2022 Weeks Of Treatment: 2 Clustered Wound: No Photos Wound Measurements Length: (cm) 2 Width: (cm) 0.8 Depth: (cm) 0.1 Area: (cm) 1.257 Volume: (cm) 0.126 % Reduction in Area: 33.3% % Reduction in Volume: 66.6% Epithelialization: None Tunneling: No Undermining: No Wound Description Classification: Grade 3 Exudate Amount: Medium Exudate Type: Serosanguineous Exudate Color: red, brown Foul Odor After Cleansing: No Slough/Fibrino Yes Wound Bed Granulation Amount: Medium (34-66%) Exposed Structure Granulation Quality: Pink Fascia Exposed:  No Necrotic Amount: Medium (34-66%) Fat Layer (Subcutaneous Tissue) Exposed: Yes Necrotic Quality: Eschar, Adherent Slough Tendon Exposed: No Muscle Exposed: No Joint Exposed: No Bone Exposed: No Treatment Notes Wound #1 (Toe Second) Wound Laterality: Right, Anterior Cleanser TAVITA, EASTHAM [name] ([id number]) [id number] Page 9 of 9 [employer] Ancillary Kit - 15 Day Supply Discharge Instruction: Use supplies as instructed; Kit contains: (15) Saline Bullets; (15) 3x3 Gauze; 15 pr Gloves Soap and Water Discharge Instruction: Gently cleanse wound with antibacterial soap, rinse and pat dry prior to dressing wounds Vashe 5.8 (oz) Discharge Instruction: Use vashe 5.8 (oz) as directed Peri-Wound Care Topical Primary Dressing Prisma 4.34 (in) Discharge Instruction: Moisten w/normal saline or sterile water; Cover wound as directed. Brian not remove from wound bed. Xeroform-HBD 2x2 (in/in) Discharge Instruction: Apply Xeroform-HBD 2x2 (in/in) as directed Secondary Dressing Secured With Conform 2''- Conforming Stretch Gauze Bandage 2x75 (in/in) Discharge Instruction: Apply as directed Stretch Net Dressing, Latex-free, Size 5, Small-Head / Shoulder / Thigh Discharge Instruction: size 2 used Compression Wrap Compression Stockings Add-Ons Electronic Signature(s) Signed: 01/13/2023 4:30:42 PM By: Vaughn Reche Entered By: Vaughn Reche on 01/13/2023 07:51:02 -------------------------------------------------------------------------------- Vitals Details Patient Name: Date of Service: Brian ORN, Brian NA LD [name]. 01/13/2023 7:30 [name] Medical Record Number: [id number] Patient Account Number: [id number] Date of Birth/Sex: Treating RN: 1955/06/14 (68 y.o. NETTY Vaughn Reche Primary Care Crystalann Korf: Sampson Scales Other Clinician: Referring Morningstar Toft: Treating Elsie Baynes/Extender: Bethena Andre Sampson Scales Weeks in Treatment: 2 Vital Signs Time Taken: 07:45 Temperature (F):  97.5 Height (in): 69 Pulse (bpm): 71 Weight (lbs): 210 Respiratory Rate (breaths/min): 18 Body Mass Index (BMI): 31 Blood Pressure (mmHg): 161/84 Reference Range: 80 - 120 mg / dl Electronic Signature(s) Signed: 01/13/2023 4:30:42 PM By: Vaughn Reche Entered By: Vaughn Reche on 01/13/2023 07:45:38

## 2023-01-14 ENCOUNTER — Encounter: Payer: Medicare HMO | Admitting: Physician Assistant

## 2023-01-14 DIAGNOSIS — E11621 Type 2 diabetes mellitus with foot ulcer: Secondary | ICD-10-CM | POA: Diagnosis not present

## 2023-01-14 LAB — GLUCOSE, CAPILLARY: Glucose-Capillary: 304 mg/dL — ABNORMAL HIGH (ref 70–99)

## 2023-01-14 NOTE — Progress Notes (Signed)
 TEJUAN, GHOLSON A ([id number]) [id number] Page 1 of 2 Visit Report for 01/13/2023 Problem List Details Patient Name: Date of Service: Brian ORN, [name] [place] LD A. 01/13/2023 10:30 A M Medical Record Number: [id number] Patient Account Number: [id number] Date of Birth/Sex: Treating RN: 1955-12-27 (68 y.o. M) Primary Care Provider: Sampson Scales Other Clinician: Referring Provider: Treating Provider/Extender: Bethena Andre Sampson, Scales Weeks in Treatment: 2 Active Problems ICD-10 Encounter Code Description Active Date MDM Diagnosis E11.621 Type 2 diabetes mellitus with foot ulcer 12/27/2022 No Yes L97.512 Non-pressure chronic ulcer of other part of right foot with fat layer exposed 12/27/2022 No Yes I10 Essential (primary) hypertension 12/27/2022 No Yes I48.0 Paroxysmal atrial fibrillation 12/27/2022 No Yes Z79.01 Long term (current) use of anticoagulants 12/27/2022 No Yes Z95.0 Presence of cardiac pacemaker 12/27/2022 No Yes Inactive Problems Resolved Problems Electronic Signature(s) Signed: 01/13/2023 5:00:21 PM By: Bethena Andre PA-C Entered By: Bethena Andre on 01/13/2023 14:00:21 WRIGHT [name] ([id number]) [id number] Page 2 of 2 -------------------------------------------------------------------------------- SuperBill Details Patient Name: Date of Service: Brian ORN, [name] NA LD A. 01/13/2023 Medical Record Number: [id number] Patient Account Number: [id number] Date of Birth/Sex: Treating RN: November 27, 1955 (68 y.o. M) Primary Care Provider: Sampson Scales Other Clinician: Referring Provider: Treating Provider/Extender: Bethena Andre Sampson Scales Weeks in Treatment: 2 Diagnosis Coding ICD-10 Codes Code Description E11.621 Type 2 diabetes mellitus with foot ulcer L97.512 Non-pressure chronic ulcer of other part of right foot with fat layer exposed I10 Essential (primary) hypertension I48.0 Paroxysmal atrial  fibrillation Z79.01 Long term (current) use of anticoagulants Z95.0 Presence of cardiac pacemaker Facility Procedures : CPT4 Code: 58699997 Description: (Facility Use Only) HBOT full body chamber, , Modifier: Quantity: 4 Physician Procedures : CPT4: Description Modifier Code [id number] Physician attendance and supervision of hyperbaric oxygen therapy, per sessionsupervision of hyperbaric Oxygen therapy, per session ICD-10 Diagnosis Description L97.512 Non-pressure chronic ulcer of other part  of right foot with fat layer exposed E11.621 Type 2 diabetes mellitus with foot ulcer Quantity: 1 Electronic Signature(s) Signed: 01/13/2023 5:00:30 PM By: Bethena Andre PA-C Entered By: Bethena Andre on 01/13/2023 14:00:30

## 2023-01-14 NOTE — Progress Notes (Signed)
 [name] ([id number]) [id number] Page 1 of 2 Visit Report for 01/14/2023 Arrival Information Details Patient Name: Date of Service: Brian ORN, Brian [place] LD [name]. 01/14/2023 7:30 [name] Medical Record Number: [id number] Patient Account Number: [id number] Date of Birth/Sex: Treating RN: 04-Jul-1955 (68 y.o. M) Primary Care Zayley Arras: Sampson Scales Other Clinician: [name] Referring Mackenzie Groom: Treating Dymphna Wadley/Extender: Bethena Andre Sampson Scales Devra in Treatment: 2 Visit Information History Since Last Visit Added or deleted any medications: No Patient Arrived: Ambulatory Any new allergies or adverse reactions: No Arrival Time: 07:30 Had [name] fall or experienced change in No Accompanied By: self activities of daily living that may affect Transfer Assistance: None risk of falls: Patient Identification Verified: Yes Signs or symptoms of abuse/neglect since last visito No Secondary Verification Process Completed: Yes Hospitalized since last visit: No Patient Requires Transmission-Based Precautions: No Implantable device outside of the clinic excluding No Patient Has Alerts: Yes cellular tissue based products placed in the center Patient Alerts: Patient on Blood Thinner since last visit: Diabetic Type 2 Pain Present Now: No Eliquis  Electronic Signature(s) Signed: 01/14/2023 11:58:39 AM By: [name] Entered By: [name] on 01/14/2023 11:55:07 -------------------------------------------------------------------------------- Encounter Discharge Information Details Patient Name: Date of Service: [name], [name]. 01/14/2023 7:30 [name] Medical Record Number: [id number] Patient Account Number: [id number] Date of Birth/Sex: Treating RN: [birth year] (68 y.o. M) Primary Care Kasyn Rolph: Sampson Scales Other Clinician: [name] Referring Analie Katzman: Treating Robertt Buda/Extender: Bethena Andre Sampson Scales Devra in Treatment: 2 Encounter  Discharge Information Items Discharge Condition: Stable Ambulatory Status: Ambulatory Discharge Destination: Home Transportation: Private Auto Accompanied By: self Schedule Follow-up Appointment: Yes Clinical Summary of Care: [name] ([id number]) [phone].pdf Page 2 of 2 Electronic Signature(s) Signed: 01/14/2023 11:58:39 AM By: [name] Entered By: [name] on 01/14/2023 11:58:18 -------------------------------------------------------------------------------- Vitals Details Patient Name: Date of Service: [name], [name]. 01/14/2023 7:30 [name] Medical Record Number: [id number] Patient Account Number: [id number] Date of Birth/Sex: Treating RN: Nov 10, 1955 (68 y.o. M) Primary Care Zephyr Ridley: Sampson Scales Other Clinician: [name] Referring Shonnie Poudrier: Treating Raquon Milledge/Extender: Bethena Andre Sampson Scales Devra in Treatment: 2 Vital Signs Time Taken: 07:30 Temperature (F): 97.8 Height (in): 69 Pulse (bpm): 80 Weight (lbs): 210 Respiratory Rate (breaths/min): 18 Body Mass Index (BMI): 31 Blood Pressure (mmHg): 122/78 Capillary Blood Glucose (mg/dl): 695 Reference Range: 80 - 120 mg / dl Airway Pulse Oximetry (%): 99 Electronic Signature(s) Signed: 01/14/2023 11:58:39 AM By: [name] Entered By: [name] on 01/14/2023 11:55:37

## 2023-01-14 NOTE — Progress Notes (Signed)
 KARVER, FADDEN A ([id number]) [id number] Page 1 of 2 Visit Report for 01/14/2023 HBO Details Patient Name: Date of Service: Brian ORN, Brian [place] LD A. 01/14/2023 7:30 A M Medical Record Number: [id number] Patient Account Number: [id number] Date of Birth/Sex: Treating RN: 12/08/1955 (68 y.o. 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, wagoner grade 3 oe Notes: by MRI HBO Treatment Details Treatment Number: 7 Patient Type: Outpatient Chamber Type: Monoplace Chamber Serial #: [id number] Treatment Protocol: 2.0 ATA with 90 minutes oxygen, and no air breaks Treatment Details Compression Rate Down: 2.0 psi / minute De-Compression Rate Up: 1.0 psi / minute Air breaks and breathing Decompress Decompress Compress Tx Pressure Begins Reached periods Begins Ends (leave unused spaces blank) Chamber Pressure (ATA 1 2 ------2 1 ) Clock Time (24 hr) 7:58 8:09 - - - - - - 9:39 9:49 Treatment Length: 111 (minutes) Treatment Segments: 4 Vital Signs Capillary Blood Glucose Reference Range: 80 - 120 mg / dl HBO Diabetic Blood Glucose Intervention Range: <131 mg/dl or >750 mg/dl Time Vitals Blood Respiratory Capillary Blood Glucose Pulse Action Type: Pulse: Temperature: Taken: Pressure: Rate: Glucose (mg/dl): Meter #: Oximetry (%) Taken: Pre 07:30 122/78 80 18 97.8 304 1 99 Post 09:49 120/80 82 18 98 274 1 Treatment Response Treatment Toleration: Well Treatment Completion Status: Treatment Completed without Adverse Event Electronic Signature(s) Signed: 01/14/2023 11:58:39 AM By: [name] Signed: 01/14/2023 12:49:39 PM By:  Bethena Andre PA-C Entered By: [name] on 01/14/2023 11:57:44 Muraski, Ramonte A ([id number]) [id number] Page 2 of 2 -------------------------------------------------------------------------------- HBO Safety Checklist Details Patient Name: Date of Service: Brian ORN, Brian [place] LD A. 01/14/2023 7:30 A M Medical Record Number: [id number] Patient Account Number: [id number] Date of Birth/Sex: Treating RN: Mar 12, 1955 (68 y.o. M) Primary Care Laria Grimmett: [name] Other Clinician: [name] Referring Kimiya Brunelle: Treating Kevontae Burgoon/Extender: Bethena Andre [name] Weeks in Treatment: 2 HBO Safety Checklist Items Safety Checklist Consent Form Signed Patient voided / foley secured and emptied When did you last eato 01/13/22 Last dose of injectable or oral agent 01/13/22 Ostomy pouch emptied and vented if applicable NA All implantable devices assessed, documented and approved NA Intravenous access site secured and place NA Valuables secured Linens and cotton and cotton/polyester blend (less than 51% polyester) Personal oil-based products / skin lotions / body lotions removed Wigs or hairpieces removed NA Smoking or tobacco materials removed NA Books / newspapers / magazines / loose paper removed Cologne, aftershave, perfume and deodorant removed Jewelry removed (may wrap wedding band) Make-up removed NA Hair care products removed Battery operated devices (external) removed NA Heating patches and chemical warmers removed NA Titanium eyewear removed NA Nail polish cured greater than 10 hours NA Casting material cured greater than 10 hours NA Hearing aids removed NA Loose dentures or partials removed NA Prosthetics have been removed NA Patient demonstrates correct use of air break device (if applicable) Patient concerns have been addressed Patient grounding bracelet on and cord attached to chamber Specifics for Inpatients (complete in  addition to above) Medication sheet sent with patient NA Intravenous medications needed or due during therapy sent with patient NA Drainage tubes (e.g. nasogastric tube or chest tube secured and vented) NA Endotracheal or Tracheotomy tube secured NA Cuff deflated of air  and inflated with saline NA Airway suctioned NA Electronic Signature(s) Signed: 01/14/2023 11:58:39 AM By: [name] Entered By: [name] on 01/14/2023 11:56:24

## 2023-01-14 NOTE — Progress Notes (Signed)
 SHAWNE, ESKELSON A ([id number]) [id number] Page 1 of 2 Visit Report for 01/13/2023 Arrival Information Details Patient Name: Date of Service: [name], [name] 01/13/2023 10:30 A M Medical Record Number: [id number] Patient Account Number: [id number] Date of Birth/Sex: Treating RN: 1955-12-20 (68 y.o. M) Primary Care Corie Allis: Sampson Scales Other Clinician: Referring Cayli Escajeda: Treating Carmelina Balducci/Extender: Bethena Andre Sampson Scales Devra in Treatment: 2 Visit Information History Since Last Visit Added or deleted any medications: No Patient Arrived: Ambulatory Any new allergies or adverse reactions: No Arrival Time: 08:00 Had a fall or experienced change in No Accompanied By: self activities of daily living that may affect Transfer Assistance: None risk of falls: Patient Identification Verified: Yes Signs or symptoms of abuse/neglect since last visito No Secondary Verification Process Completed: Yes Hospitalized since last visit: No Patient Requires Transmission-Based Precautions: No Implantable device outside of the clinic excluding No Patient Has Alerts: Yes cellular tissue based products placed in the center Patient Alerts: Patient on Blood Thinner since last visit: Diabetic Type 2 Pain Present Now: No Eliquis  Electronic Signature(s) Signed: 01/14/2023 11:58:39 AM By: [name] Entered By: [name] on 01/13/2023 11:01:49 -------------------------------------------------------------------------------- Encounter Discharge Information Details Patient Name: Date of Service: [name], DO NA LD A. 01/13/2023 10:30 A M Medical Record Number: [id number] Patient Account Number: [id number] Date of Birth/Sex: Treating RN: 1955/09/06 (68 y.o. M) Primary Care Joziyah Roblero: Sampson Scales Other Clinician: Referring Chen Saadeh: Treating Epifanio Labrador/Extender: Bethena Andre Sampson Scales Devra in Treatment: 2 Encounter Discharge Information  Items Discharge Condition: Stable Ambulatory Status: Ambulatory Discharge Destination: Home Transportation: Private Auto Accompanied By: self Schedule Follow-up Appointment: Yes Clinical Summary of Care: BOYDE, GRIECO ([id number]) [id number] Page 2 of 2 Electronic Signature(s) Signed: 01/14/2023 11:58:39 AM By: [name] Entered By: [name] on 01/13/2023 11:11:59 -------------------------------------------------------------------------------- Vitals Details Patient Name: Date of Service: [name], DO NA LD A. 01/13/2023 10:30 A M Medical Record Number: [id number] Patient Account Number: [id number] Date of Birth/Sex: Treating RN: 09-Nov-1955 (68 y.o. M) Primary Care William Laske: Sampson Scales Other Clinician: Referring Graceyn Fodor: Treating Makalynn Berwanger/Extender: Bethena Andre Sampson Scales Weeks in Treatment: 2 Vital Signs Time Taken: 08:00 Temperature (F): 98.0 Height (in): 69 Pulse (bpm): 59 Weight (lbs): 210 Respiratory Rate (breaths/min): 18 Body Mass Index (BMI): 31 Blood Pressure (mmHg): 134/80 Capillary Blood Glucose (mg/dl): 730 Reference Range: 80 - 120 mg / dl Airway Pulse Oximetry (%): 99 Electronic Signature(s) Signed: 01/14/2023 11:58:39 AM By: [name] Entered By: [name] on 01/13/2023 11:01:53

## 2023-01-14 NOTE — Progress Notes (Signed)
 LEDFORD, GOODSON A ([id number]) [id number] Page 1 of 2 Visit Report for 01/13/2023 HBO Details Patient Name: Date of Service: Brian ORN, DO NA LD A. 01/13/2023 10:30 A M Medical Record Number: [id number] Patient Account Number: [id number] Date of Birth/Sex: Treating RN: 07/07/1955 (68 y.o. 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, wagoner grade 3 oe Notes: by MRI HBO Treatment Details Treatment Number: 6 Patient Type: Outpatient Chamber Type: Monoplace Chamber Serial #: [id number] Treatment Protocol: 2.0 ATA with 90 minutes oxygen, and no air breaks Treatment Details Compression Rate Down: 2.0 psi / minute De-Compression Rate Up: 1.0 psi / minute Air breaks and breathing Decompress Decompress Compress Tx Pressure Begins Reached periods Begins Ends (leave unused spaces blank) Chamber Pressure (ATA 1 2 ------2 1 ) Clock Time (24 hr) 8:33 8:46 - - - - - - 10:16 10:28 Treatment Length: 115 (minutes) Treatment Segments: 4 Vital Signs Capillary Blood Glucose Reference Range: 80 - 120 mg / dl HBO Diabetic Blood Glucose Intervention Range: <131 mg/dl or >750 mg/dl Time Vitals Blood Respiratory Capillary Blood Glucose Pulse Action Type: Pulse: Temperature: Taken: Pressure: Rate: Glucose (mg/dl): Meter #: Oximetry (%) Taken: Pre 08:00 134/80 59 18 98 269 1 99 Post 10:26 128/80 62 18 97.8 251 1 Treatment Response Treatment Toleration: Well Treatment Completion Status: Treatment Completed without Adverse Event HBO Attestation I certify that I supervised this HBO treatment in accordance with Medicare guidelines. A trained  emergency response team is readily available per Yes hospital policies and procedures. Continue HBOT as ordered. Yes Electronic Signature(s) Signed: 01/13/2023 5:00:26 PM By: Bethena Andre DEVONNA WRIGHT, Tor A ([id number]) [id number] Page 2 of 2 Entered By: Bethena Andre on 01/13/2023 17:00:26 -------------------------------------------------------------------------------- HBO Safety Checklist Details Patient Name: Date of Service: Brian ORN, [name] 01/13/2023 10:30 A M Medical Record Number: [id number] Patient Account Number: [id number] Date of Birth/Sex: Treating RN: 06-10-1955 (68 y.o. M) Primary Care Lesle Faron: Sampson Scales Other Clinician: Referring Marcee Jacobs: Treating Ezariah Nace/Extender: Bethena Andre Sampson Scales Weeks in Treatment: 2 HBO Safety Checklist Items Safety Checklist Consent Form Signed Patient voided / foley secured and emptied When did you last eato 01/12/22 Last dose of injectable or oral agent 01/12/22 Ostomy pouch emptied and vented if applicable NA All implantable devices assessed, documented and approved NA Intravenous access site secured and place NA Valuables secured Linens and cotton and cotton/polyester blend (less than 51% polyester) Personal oil-based products / skin lotions / body lotions removed Wigs or hairpieces removed NA Smoking or tobacco materials removed NA Books / newspapers / magazines / loose paper removed Cologne, aftershave, perfume and deodorant removed Jewelry removed (may wrap wedding band) Make-up removed NA Hair care products removed Battery operated devices (external) removed NA Heating patches and chemical warmers removed NA Titanium eyewear removed NA Nail polish cured greater than 10 hours NA Casting material cured greater than 10 hours NA Hearing aids removed NA Loose dentures or partials removed NA Prosthetics have been removed NA Patient demonstrates correct use of air break  device (if applicable) Patient concerns have been addressed Patient grounding bracelet on and cord attached to chamber Specifics for Inpatients (complete in addition to above) Medication sheet sent with patient NA Intravenous medications needed or due during therapy sent with patient  NA Drainage tubes (e.g. nasogastric tube or chest tube secured and vented) NA Endotracheal or Tracheotomy tube secured NA Cuff deflated of air and inflated with saline NA Airway suctioned NA Electronic Signature(s) Signed: 01/14/2023 11:58:39 AM By: [name] Entered By: [name] on 01/13/2023 11:01:57

## 2023-01-17 ENCOUNTER — Encounter: Payer: Medicare HMO | Admitting: Physician Assistant

## 2023-01-17 DIAGNOSIS — E11621 Type 2 diabetes mellitus with foot ulcer: Secondary | ICD-10-CM | POA: Diagnosis not present

## 2023-01-17 LAB — GLUCOSE, CAPILLARY
Glucose-Capillary: 274 mg/dL — ABNORMAL HIGH (ref 70–99)
Glucose-Capillary: 297 mg/dL — ABNORMAL HIGH (ref 70–99)
Glucose-Capillary: 215 mg/dL — ABNORMAL HIGH (ref 70–99)

## 2023-01-17 NOTE — Progress Notes (Signed)
 Brian [name], Brian [name] ([id number]) [id number] Page 1 of 9 Visit Report for 01/17/2023 Chief Complaint Document Details Patient Name: Date of Service: Brian [name], Brian [name]. 01/17/2023 10:30 [name] Medical Record Number: [id number] Patient Account Number: [id number] Date of Birth/Sex: Treating RN: [birth year] (68 y.o. Brian [name] Primary Care Provider: Sampson [name] Other Clinician: Referring Provider: Treating Provider/Extender: Brian [name] Brian [name] Weeks in Treatment: 3 Information Obtained from: Patient Chief Complaint Right second toe ulcer Electronic Signature(s) Signed: 01/17/2023 2:14:47 PM By: Brian Andre PA-C Entered By: Brian [name] on 01/17/2023 11:14:47 -------------------------------------------------------------------------------- Debridement Details Patient Name: Date of Service: Brian ORN, Brian NA LD [name]. 01/17/2023 10:30 [name] Medical Record Number: [id number] Patient Account Number: [id number] Date of Birth/Sex: Treating RN: August 17, 1955 (68 y.o. Brian [name] Primary Care Provider: Sampson [name] Other Clinician: Referring Provider: Treating Provider/Extender: Brian [name] Brian [name] Weeks in Treatment: 3 Debridement Performed for Assessment: Wound #1 Right,Anterior T Second oe Performed By: Physician Brian Andre, PA-C The following information was scribed by: Vaughn [name] The information was scribed for: Brian [name] Debridement Type: Debridement Severity of Tissue Pre Debridement: Fat layer exposed Level of Consciousness (Pre-procedure): Awake and Alert Pre-procedure Verification/Time Out Yes - 11:03 Taken: Pain Control: Lidocaine  4% T opical Solution Percent of Wound Bed Debrided: 100% T Area Debrided (cm): otal 1.04 Tissue and other material debrided: Viable, Non-Viable, Slough, Subcutaneous, Biofilm, Slough Level: Skin/Subcutaneous Tissue Debridement Description: Excisional Instrument: Curette Bleeding:  Moderate Hemostasis Achieved: Pressure Brian [name], Brian [name] ([id number]) [id number] Page 2 of 9 Response to Treatment: Procedure was tolerated well Level of Consciousness (Post- Awake and Alert procedure): Post Debridement Measurements of Total Wound Length: (cm) 1.9 Width: (cm) 0.7 Depth: (cm) 0.2 Volume: (cm) 0.209 Character of Wound/Ulcer Post Debridement: Stable Severity of Tissue Post Debridement: Fat layer exposed Post Procedure Diagnosis Same as Pre-procedure Electronic Signature(s) Signed: 01/17/2023 11:19:52 AM By: Vaughn [name] Signed: 01/17/2023 2:24:34 PM By: Brian Andre PA-C Entered By: Vaughn [name] on 01/17/2023 08:19:52 -------------------------------------------------------------------------------- HPI Details Patient Name: Date of Service: Brian ORN, Brian NA LD [name]. 01/17/2023 10:30 [name] Medical Record Number: [id number] Patient Account Number: [id number] Date of Birth/Sex: Treating RN: [birth year] (68 y.o. Brian [name] Primary Care Provider: Sampson [name] Other Clinician: Referring Provider: Treating Provider/Extender: Brian [name] Brian [name] Weeks in Treatment: 3 History of Present Illness Chronic/Inactive Conditions Condition 1: 12-27-2022 patient's ABI in the office was 0.92 on the right. With that being said he did have [name] bilateral lower extremity arterial duplex evaluation and this was on December 16, 2022. The result of this showed abnormal left extremity arterial Doppler consistent with stenosis within the distal superficial femoral and popliteal arteries. CTA of the abdominal aorta including lower extremity runoffs is recommended. There was stated to be no significant plaque formation visualized within the arteries of the right lower extremity HPI Description: 12-27-2022 upon evaluation today patient presents for initial inspection here in the clinic as [name] referral from his primary care provider, Dr. Sampson. With  that being said I did discuss with the patient the treatment that is been undertaken up to this point as well as reviewing what I could in epic although I could not see any of the actual PCP notes it sounds as if she has been doing an awesome job with regard to the care of the patient up to this point. He does have osteomyelitis of the second toe right foot. With that being said there is [name] wound here as well there is some muscle/tendon exposed  some of this necrotic as well over the area in question. 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 I Brian believe that he has had excellent care and has been on antibiotics since actually 8 November initially he was placed on it appears doxycycline and then transition to Bactrim which is what he is still on currently. Fortunately he has seen some signs of improvement with regard to the infection which I think is definitely good but I think he still at high risk as far as amputation of the toe is concerned and I discussed that with him today as well. Overall I believe that he is [name] strong candidate for hyperbaric oxygen therapy to try to get his wound to heal. I discussed with the patient how this can increase his chances of healing from around 50% with what we have going on right now to closer to the 85% with HBO therapy. This is obviously [name] fairly significant shift and though not 100% gives him the best chance I think to try to get this wound healed without progressing to further intervention such as amputation. Patient does have [name] history of diabetes mellitus type 2, hypertension, atrial fibrillation, long-term use of anticoagulant therapy, and he does have [name] cardiac pacemaker. I did review notes through epic. He has had [name] recent EKG which did not appear to be significantly different from an EKG even about [name] month ago which was good news. This EKG was performed 12-17-2022 and the when it was compared to was 11-19-2022 and  10-25-2022. Subsequently the patient also has undergone an arterial lower extremity duplex which showed on 12-15-2022 that there was no significant plaque formation visualized within the arteries of the right lower extremity. It would appear that he has sufficient arterial flow to heal although he does have further follow-up with vascular as well. This is mainly in regard to his left lower extremity. He did have an MRI on December 10, 2022 which shows that he does have osteomyelitis which was stated to be acute at that point of the middle phalanx of the second toe right foot. Again he has been on antibiotics since November 19, 2022. 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 I did review the patient's CBC and currently white blood cell count was 9.0 hemoglobin 12.6 hematocrit 37.9 otherwise everything was within normal limits. I did also review the basic metabolic panel which showed that the patient does have elevated creatinine with [name] normal BUN. Estimated GFR was 44. Upon reviewing notes on 12-17-2022 also came across the note where the patient has had an echo on March 05, 2022 which did demonstrated left ventricular ejection fraction estimated to be 60 to 65% with normal function of the left ventricle and no regional wall abnormalities. There was just mild ventricular hypertrophy. Patient's most recent hemoglobin A1c was November 04, 2022 and was at 9.0. 12/27; this is [name] patient with [name] difficult wound over the dorsal surface of his right second toe. He had an MRI on 11/24 showing acute osteomyelitis in the middle of phalanx and either reactive osteitis or early acute osteomyelitis involving the proximal phalanx. He tells me he has completed antibiotics but I am really not Brian [name], Brian [name] ([id number]) [id number] Page 3 of 9 sure of the duration of treatment. I Brian not see any relevant cultures in epic. He started HBO on Monday  and said he had some ear pain  in his first dive but feels better lately although his ears feel somewhat stuffy. No pain We have been using Prisma as the primary dressing. He has [name] surgical shoe that would preclude any pressure or friction on the involved area. The patient had [name] CT angiogram and by [facility] with and without contrast on 12/21. This did not show any acute abnormality within the abdomen or pelvis no hemodynamically significant peripheral arterial disease in either lower extremity. 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 Aortic atherosclerosis but nothing that seems to indicate an adequate flow for healing the wound we are concerned with 01-13-23 upon evaluation today patient appears to be doing well currently in regard to his wound. He is showing signs of improvement and this does seem to be doing quite well. Fortunately I Brian not see any evidence of active infection at this time which is great news. 01-17-23 upon evaluation today patient appears to be doing well currently in regard to his toe ulcer. This is showing signs of improvement I am very pleased with where we stand. I Brian not see any evidence of worsening and I feel like slowly but surely this wound is getting smaller I Brian believe he has been helped by the hyperbaric oxygen therapy along with good wound care. Electronic Signature(s) Signed: 01/17/2023 2:15:09 PM By: Brian Ferraris PA-C Entered By: Brian Ferraris on 01/17/2023 11:15:09 -------------------------------------------------------------------------------- Physical Exam Details Patient Name: Date of Service: Brian ORN, Brian NA LD [name]. 01/17/2023 10:30 [name] Medical Record Number: [id number] Patient Account Number: [id number] Date of Birth/Sex: Treating RN: [birth year] (68 y.o. Brian [name] Primary Care Provider: Sampson [name] Other Clinician: Referring Provider: Treating Provider/Extender: Brian Ferraris Brian [name] Weeks in Treatment:  3 Constitutional Well-nourished and well-hydrated in no acute distress. Respiratory normal breathing without difficulty. Psychiatric this patient is able to make decisions and demonstrates good insight into disease process. Alert and Oriented x 3. pleasant and cooperative. Notes Upon inspection patient's wound bed actually showed signs of excellent granulation and epithelization at this point. Fortunately I Brian not see any signs of infection at this time and overall the patient is doing excellent. Electronic Signature(s) Signed: 01/17/2023 2:15:21 PM By: Brian Ferraris PA-C Entered By: Brian Ferraris on 01/17/2023 11:15:21 -------------------------------------------------------------------------------- Physician Orders Details Patient Name: Date of Service: Brian ORN, Brian NA LD [name]. 01/17/2023 10:30 [name] Brian [name], Brian [name] ([id number]) [id number] Page 4 of 9 Medical Record Number: [id number] Patient Account Number: [id number] Date of Birth/Sex: Treating RN: Sep 18, 1955 (68 y.o. Brian [name] Primary Care Provider: Sampson [name] Other Clinician: Referring Provider: Treating Provider/Extender: Brian Ferraris Brian [name] Devra in Treatment: 3 The following information was scribed by: Vaughn [name] The information was scribed for: Brian Ferraris Verbal / Phone Orders: No Diagnosis Coding Follow-up Appointments Return Appointment in 1 week. Bathing/ Shower/ Hygiene May shower; gently cleanse wound with antibacterial soap, rinse and pat dry prior to dressing wounds Anesthetic (Use 'Patient Medications' Section for Anesthetic Order Entry) Lidocaine  applied to wound bed Hyperbaric Oxygen Therapy Wound #1 Right,Anterior T Second oe Evaluate for HBO Therapy Indication and location: - Right 2nd T Ulcer, wagoner grade 3 by MRI oe If appropriate for treatment, begin HBOT per protocol: 2.0 ATA for 90 Minutes without [name] Breaks ir One treatment per day (delivered  Monday through Friday unless otherwise specified in Special Instructions below): Total # of Treatments: - 40 [name] ntihistamine 30 minutes prior to HBO Treatment, difficulty clearing ears. Finger stick Blood Glucose Pre- and Post- HBOT  Treatment. Follow Hyperbaric Oxygen Glycemia Protocol Wound Treatment Wound #1 - T Second oe Wound Laterality: Right, Anterior Cleanser: Byram Ancillary Kit - 15 Day Supply (Generic) Every Other Day/30 Days Discharge Instructions: Use supplies as instructed; Kit contains: (15) Saline Bullets; (15) 3x3 Gauze; 15 pr Gloves Cleanser: Soap and Water Every Other Day/30 Days Discharge Instructions: Gently cleanse wound with antibacterial soap, rinse and pat dry prior to dressing wounds Cleanser: Vashe 5.8 (oz) Every Other Day/30 Days Discharge Instructions: Use vashe 5.8 (oz) as directed Prim Dressing: Prisma 4.34 (in) (Generic) Every Other Day/30 Days ary Discharge Instructions: Moisten w/normal saline or sterile water; Cover wound as directed. Brian not remove from wound bed. Prim Dressing: Xeroform-HBD 2x2 (in/in) Every Other Day/30 Days ary Discharge Instructions: Apply Xeroform-HBD 2x2 (in/in) as directed Secured With: Conform 2''- Conforming Stretch Gauze Bandage 2x75 (in/in) Every Other Day/30 Days Discharge Instructions: Apply as directed Secured With: Stretch Net Dressing, Latex-free, Size 5, Small-Head / Shoulder / Thigh Every Other Day/30 Days Discharge Instructions: size 2 used GLYCEMIA INTERVENTIONS PROTOCOL PRE-HBO GLYCEMIA INTERVENTIONS ACTION INTERVENTION Obtain pre-HBO capillary blood glucose (ensure 1 physician order is in chart). [name]. Notify HBO physician and await physician orders. 2 If result is 70 mg/dl or below: B. If the result meets the hospital definition of [name] critical result, follow hospital policy. [name]. Give patient an 8 ounce Glucerna Shake, an 8 ounce Ensure, or 8 ounces of [name] Glucerna/Ensure equivalent dietary supplement*. B. Wait 30  minutes. If result is 71 mg/dl to 869 mg/dl: C. Retest patients capillary blood glucose (CBG). D. If result greater than or equal to 110 mg/dl, proceed with HBO. If result less than 110 mg/dl, notify HBO physician and consider holding HBO. If result is 131 mg/dl to 750 mg/dl: [name]. Proceed with HBO. [name]. Notify HBO physician and await physician orders. B. It is recommended to hold HBO and Brian Brian [name], Brian [name] ([id number]) [id number] Page 5 of 9 B. It is recommended to hold HBO and Brian If result is 250 mg/dl or greater: blood/urine ketone testing. C. If the result meets the hospital definition of [name] critical result, follow hospital policy. POST-HBO GLYCEMIA INTERVENTIONS ACTION INTERVENTION Obtain post HBO capillary blood glucose (ensure 1 physician order is in chart). [name]. Notify HBO physician and await physician orders. 2 If result is 70 mg/dl or below: B. If the result meets the hospital definition of [name] critical result, follow hospital policy. [name]. Give patient an 8 ounce Glucerna Shake, an 8 ounce Ensure, or 8 ounces of [name] Glucerna/Ensure equivalent dietary supplement*. B. Wait 15 minutes for symptoms of If result is 71 mg/dl to 899 mg/dl: hypoglycemia (i.e. nervousness, anxiety, sweating, chills, clamminess, irritability, confusion, tachycardia or dizziness). C. If patient asymptomatic, discharge patient. If patient symptomatic, repeat capillary blood glucose (CBG) and notify HBO physician. If result is 101 mg/dl to 750 mg/dl: [name]. Discharge patient. [name]. Notify HBO physician and await physician orders. B. It is recommended to Brian blood/urine ketone If result is 250 mg/dl or greater: testing. C. If the result meets the hospital definition of [name] critical result, follow hospital policy. *Juice or candies are NOT equivalent products. If patient refuses the Glucerna or Ensure, please consult the hospital dietitian for an appropriate substitute. Electronic  Signature(s) Signed: 01/17/2023 11:20:16 AM By: Vaughn [name] Signed: 01/17/2023 2:24:34 PM By: Brian Ferraris PA-C Entered By: Vaughn [name] on 01/17/2023 08:20:16 -------------------------------------------------------------------------------- Problem List Details Patient Name: Date of Service: Brian ORN, Brian NA LD [name]. 01/17/2023 10:30 [name] Medical Record  Number: [id number] Patient Account Number: [id number] Date of Birth/Sex: Treating RN: May 23, 1955 (68 y.o. Brian [name] Primary Care Provider: Sampson [name] Other Clinician: Referring Provider: Treating Provider/Extender: Brian [name] Brian [name] Weeks in Treatment: 3 Active Problems ICD-10 Encounter Code Description Active Date MDM Diagnosis E11.621 Type 2 diabetes mellitus with foot ulcer 12/27/2022 No Yes L97.512 Non-pressure chronic ulcer of other part of right foot with fat layer exposed 12/27/2022 No Yes I10 Essential (primary) hypertension 12/27/2022 No Yes I48.0 Paroxysmal atrial fibrillation 12/27/2022 No Yes CAMARION, WEIER [name] ([id number]) [id number] Page 6 of 9 Z79.01 Long term (current) use of anticoagulants 12/27/2022 No Yes Z95.0 Presence of cardiac pacemaker 12/27/2022 No Yes Inactive Problems Resolved Problems Electronic Signature(s) Signed: 01/17/2023 2:14:45 PM By: Brian Andre PA-C Entered By: Brian [name] on 01/17/2023 11:14:45 -------------------------------------------------------------------------------- Progress Note Details Patient Name: Date of Service: Brian ORN, Brian NA LD [name]. 01/17/2023 10:30 [name] Medical Record Number: [id number] Patient Account Number: [id number] Date of Birth/Sex: Treating RN: [birth year] (68 y.o. Brian [name] Primary Care Provider: Sampson [name] Other Clinician: Referring Provider: Treating Provider/Extender: Brian [name] Brian [name] Devra in Treatment: 3 Subjective Chief Complaint Information obtained from Patient Right  second toe ulcer History of Present Illness (HPI) Chronic/Inactive Condition: 12-27-2022 patient's ABI in the office was 0.92 on the right. With that being said he did have [name] bilateral lower extremity arterial duplex evaluation and this was on December 16, 2022. The result of this showed abnormal left extremity arterial Doppler consistent with stenosis within the distal superficial femoral and popliteal arteries. CTA of the abdominal aorta including lower extremity runoffs is recommended. There was stated to be no significant plaque formation visualized within the arteries of the right lower extremity 12-27-2022 upon evaluation today patient presents for initial inspection here in the clinic as [name] referral from his primary care provider, Dr. Sampson. With that being said I did discuss with the patient the treatment that is been undertaken up to this point as well as reviewing what I could in epic although I could not see any of the actual PCP notes it sounds as if she has been doing an awesome job with regard to the care of the patient up to this point. He does have osteomyelitis of the second toe right foot. With that being said there is [name] wound here as well there is some muscle/tendon exposed some of this necrotic as well over the area in question. 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 I Brian believe that he has had excellent care and has been on antibiotics since actually 8 November initially he was placed on it appears doxycycline and then transition to Bactrim which is what he is still on currently. Fortunately he has seen some signs of improvement with regard to the infection which I think is definitely good but I think he still at high risk as far as amputation of the toe is concerned and I discussed that with him today as well. Overall I believe that he is [name] strong candidate for hyperbaric oxygen therapy to try to get his wound to heal. I  discussed with the patient how this can increase his chances of healing from around 50% with what we have going on right now to closer to the 85% with HBO therapy. This is obviously [name] fairly significant shift and though not 100% gives him the best chance I think to try to get this wound healed without progressing to  further intervention such as amputation. Patient does have [name] history of diabetes mellitus type 2, hypertension, atrial fibrillation, long-term use of anticoagulant therapy, and he does have [name] cardiac pacemaker. I did review notes through epic. He has had [name] recent EKG which did not appear to be significantly different from an EKG even about [name] month ago which was good news. This EKG was performed 12-17-2022 and the when it was compared to was 11-19-2022 and 10-25-2022. Subsequently the patient also has undergone an arterial lower extremity duplex which showed on 12-15-2022 that there was no significant plaque formation visualized within the arteries of the right lower extremity. It would appear that he has sufficient arterial flow to heal although he does have further follow-up with vascular as well. This is mainly in regard to his left lower extremity. He did have an MRI on December 10, 2022 which shows that he does have osteomyelitis which was stated to be acute at that point of the middle phalanx of the second toe right foot. Again he has been on antibiotics since November 19, 2022. 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 I did review the patient's CBC and currently white blood cell count was 9.0 hemoglobin 12.6 hematocrit 37.9 otherwise everything was within normal limits. I did also review the basic metabolic panel which showed that the patient does have elevated creatinine with [name] normal BUN. Estimated GFR was 44. Upon reviewing notes on 12-17-2022 also came across the note where the patient has had an echo on March 05, 2022 which  did demonstrated left ventricular ejection fraction estimated to be 60 to 65% with normal function of the left ventricle and no regional wall abnormalities. There was Brian [name], Brian [name] ([id number]) [phone].pdf Page 7 of 9 just mild ventricular hypertrophy. Patient's most recent hemoglobin A1c was November 04, 2022 and was at 9.0. 12/27; this is [name] patient with [name] difficult wound over the dorsal surface of his right second toe. He had an MRI on 11/24 showing acute osteomyelitis in the middle of phalanx and either reactive osteitis or early acute osteomyelitis involving the proximal phalanx. He tells me he has completed antibiotics but I am really not sure of the duration of treatment. I Brian not see any relevant cultures in epic. He started HBO on Monday and said he had some ear pain in his first dive but feels better lately although his ears feel somewhat stuffy. No pain We have been using Prisma as the primary dressing. He has [name] surgical shoe that would preclude any pressure or friction on the involved area. The patient had [name] CT angiogram and by [facility] with and without contrast on 12/21. This did not show any acute abnormality within the abdomen or pelvis no hemodynamically significant peripheral arterial disease in either lower extremity. 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 Aortic atherosclerosis but nothing that seems to indicate an adequate flow for healing the wound we are concerned with 01-13-23 upon evaluation today patient appears to be doing well currently in regard to his wound. He is showing signs of improvement and this does seem to be doing quite well. Fortunately I Brian not see any evidence of active infection at this time which is great news. 01-17-23 upon evaluation today patient appears to be doing well currently in regard to his toe ulcer. This is showing signs of improvement I am very pleased with where we stand. I Brian not see any evidence  of  worsening and I feel like slowly but surely this wound is getting smaller I Brian believe he has been helped by the hyperbaric oxygen therapy along with good wound care. Objective Constitutional Well-nourished and well-hydrated in no acute distress. Vitals Time Taken: 10:46 AM, Height: 69 in, Weight: 210 lbs, BMI: 31, Temperature: 98 F, Pulse: 70 bpm, Respiratory Rate: 18 breaths/min, Blood Pressure: 128/80 mmHg. Respiratory normal breathing without difficulty. Psychiatric this patient is able to make decisions and demonstrates good insight into disease process. Alert and Oriented x 3. pleasant and cooperative. General Notes: Upon inspection patient's wound bed actually showed signs of excellent granulation and epithelization at this point. Fortunately I Brian not see any signs of infection at this time and overall the patient is doing excellent. Integumentary (Hair, Skin) Wound #1 status is Open. Original cause of wound was Gradually Appeared. The date acquired was: 11/27/2022. The wound has been in treatment 3 weeks. The wound is located on the Right,Anterior T Second. The wound measures 1.9cm length x 0.7cm width x 0.1cm depth; 1.045cm^2 area and 0.104cm^3 volume. oe There is Fat Layer (Subcutaneous Tissue) exposed. There is no tunneling or undermining noted. There is [name] medium amount of serosanguineous drainage noted. There is medium (34-66%) pink granulation within the wound bed. There is [name] medium (34-66%) amount of necrotic tissue within the wound bed including Eschar and Adherent Slough. Assessment Active Problems ICD-10 Type 2 diabetes mellitus with foot ulcer Non-pressure chronic ulcer of other part of right foot with fat layer exposed Essential (primary) hypertension Paroxysmal atrial fibrillation Long term (current) use of anticoagulants Presence of cardiac pacemaker Procedures Wound #1 Pre-procedure diagnosis of Wound #1 is [name] Diabetic Wound/Ulcer of the Lower Extremity located  on the Right,Anterior T Second .Severity of Tissue Pre oe Debridement is: Fat layer exposed. There was [name] Excisional Skin/Subcutaneous Tissue Debridement with [name] total area of 1.04 sq cm performed by Brian Ferraris, PA-C. With the following instrument(s): Curette to remove Viable and Non-Viable tissue/material. Material removed includes Subcutaneous Tissue, Slough, and Biofilm after achieving pain control using Lidocaine  4% Topical Solution. No specimens were taken. [name] time out was conducted at 11:03, prior to the start of the procedure. [name] Moderate amount of bleeding was controlled with Pressure. The procedure was tolerated well. Post Debridement Measurements: 1.9cm length x 0.7cm width x 0.2cm depth; 0.209cm^3 volume. Character of Wound/Ulcer Post Debridement is stable. Severity of Tissue Post Debridement is: Fat layer exposed. Post procedure Diagnosis Wound #1: Same as Pre-Procedure Brian [name], Brian [name] ([id number]) [id number] Page 8 of 9 Plan Follow-up Appointments: Return Appointment in 1 week. Bathing/ Shower/ Hygiene: May shower; gently cleanse wound with antibacterial soap, rinse and pat dry prior to dressing wounds Anesthetic (Use 'Patient Medications' Section for Anesthetic Order Entry): Lidocaine  applied to wound bed Hyperbaric Oxygen Therapy: Wound #1 Right,Anterior T Second: oe Evaluate for HBO Therapy Indication and location: - Right 2nd T Ulcer, wagoner grade 3 by MRI oe If appropriate for treatment, begin HBOT per protocol: 2.0 ATA for 90 Minutes without Air Breaks One treatment per day (delivered Monday through Friday unless otherwise specified in Special Instructions below): T # of Treatments: - 40 otal Antihistamine 30 minutes prior to HBO Treatment, difficulty clearing ears. Finger stick Blood Glucose Pre- and Post- HBOT Treatment. Follow Hyperbaric Oxygen Glycemia Protocol WOUND #1: - T Second Wound Laterality: Right, Anterior oe Cleanser: Byram  Ancillary Kit - 15 Day Supply (Generic) Every Other Day/30 Days Discharge Instructions: Use supplies as instructed; Kit contains: (15) Saline Bullets; (  15) 3x3 Gauze; 15 pr Gloves Cleanser: Soap and Water Every Other Day/30 Days Discharge Instructions: Gently cleanse wound with antibacterial soap, rinse and pat dry prior to dressing wounds Cleanser: Vashe 5.8 (oz) Every Other Day/30 Days Discharge Instructions: Use vashe 5.8 (oz) as directed Prim Dressing: Prisma 4.34 (in) (Generic) Every Other Day/30 Days ary Discharge Instructions: Moisten w/normal saline or sterile water; Cover wound as directed. Brian not remove from wound bed. Prim Dressing: Xeroform-HBD 2x2 (in/in) Every Other Day/30 Days ary Discharge Instructions: Apply Xeroform-HBD 2x2 (in/in) as directed Secured With: Conform 2''- Conforming Stretch Gauze Bandage 2x75 (in/in) Every Other Day/30 Days Discharge Instructions: Apply as directed Secured With: Stretch Net Dressing, Latex-free, Size 5, Small-Head / Shoulder / Thigh Every Other Day/30 Days Discharge Instructions: size 2 used 1. I would recommend that we have the patient continue to monitor for any evidence of infection or worsening. Based on what I see I Brian believe that he is doing awesome with regard to the Prisma followed by Xeroform is keeping this from drying out. 2. I am going to recommend he should continue to monitor for any signs of infection he is using some roll gauze and then stretch net to secure in place. 3. Also can recommend that he continue with the hyperbaric oxygen therapy which I think has been excellent for him but he is having [name] little trouble with his ears but seems to be tolerating and managing pretty well. I did look in his ears today he had [name] little bit of fluid behind but the redness and erythema is actually resolving no bleeding. We will see patient back for reevaluation in 1 week here in the clinic. If anything worsens or changes patient will contact  our office for additional recommendations. Electronic Signature(s) Signed: 01/17/2023 2:16:10 PM By: Brian Ferraris PA-C Entered By: Brian Ferraris on 01/17/2023 11:16:10 -------------------------------------------------------------------------------- SuperBill Details Patient Name: Date of Service: Brian ORN, Brian NA LD [name]. 01/17/2023 Medical Record Number: [id number] Patient Account Number: [id number] Date of Birth/Sex: Treating RN: 02-Oct-1955 (68 y.o. Brian [name] Primary Care Provider: Sampson [name] Other Clinician: Referring Provider: Treating Provider/Extender: Brian Ferraris Brian [name] Weeks in Treatment: 3 Diagnosis Coding Brian [name], Brian [name] ([id number]) [id number] Page 9 of 9 ICD-10 Codes Code Description E11.621 Type 2 diabetes mellitus with foot ulcer L97.512 Non-pressure chronic ulcer of other part of right foot with fat layer exposed I10 Essential (primary) hypertension I48.0 Paroxysmal atrial fibrillation Z79.01 Long term (current) use of anticoagulants Z95.0 Presence of cardiac pacemaker Facility Procedures : CPT4 Code: [id number] Description: 11042 - DEB SUBQ TISSUE 20 SQ CM/< ICD-10 Diagnosis Description L97.512 Non-pressure chronic ulcer of other part of right foot with fat layer exposed Modifier: Quantity: 1 Physician Procedures : CPT4 Code Description Modifier 11042 11042 - WC PHYS SUBQ TISS 20 SQ CM ICD-10 Diagnosis Description L97.512 Non-pressure chronic ulcer of other part of right foot with fat layer exposed Quantity: 1 Electronic Signature(s) Signed: 01/17/2023 2:16:17 PM By: Brian Ferraris PA-C Entered By: Brian Ferraris on 01/17/2023 11:16:17

## 2023-01-17 NOTE — Progress Notes (Signed)
 [name] ([id number]) [id number] Page 1 of 2 Visit Report for 01/17/2023 HBO Details Patient Name: Date of Service: Brian ORN, DO [place] LD [name]. 01/17/2023 7:30 [name] Medical Record Number: [id number] Patient Account Number: [id number] Date of Birth/Sex: Treating RN: 1955/09/07 (68 y.o. M) Primary Care [name]: [name] Other Clinician: Myriam [name] Referring [name]: Treating [name] [name] [name] in Treatment: 3 HBO Treatment Course Details Treatment Course Number: 1 Ordering [name] T Treatments Ordered: otal 40 HBO Treatment Start Date: 01/03/2023 HBO Indication: Other (specify in Notes) Right 2nd T Ulcer, wagoner grade 3 oe Notes: by MRI HBO Treatment Details Treatment Number: 8 Patient Type: Outpatient Chamber Type: Monoplace Chamber Serial #: [id number] Treatment Protocol: 2.0 ATA with 90 minutes oxygen, and no air breaks Treatment Details Compression Rate Down: 2.0 psi / minute De-Compression Rate Up: 1.5 psi / minute Air breaks and breathing Decompress Decompress Compress Tx Pressure Begins Reached periods Begins Ends (leave unused spaces blank) Chamber Pressure (ATA 1 2 ------2 1 ) Clock Time (24 hr) 8:40 8:51 - - - - - - 10:22 10:32 Treatment Length: 112 (minutes) Treatment Segments: 4 Vital Signs Capillary Blood Glucose Reference Range: 80 - 120 mg / dl HBO Diabetic Blood Glucose Intervention Range: <131 mg/dl or >750 mg/dl Time Vitals Blood Respiratory Capillary Blood Glucose Pulse Action Type: Pulse: Temperature: Taken: Pressure: Rate: Glucose (mg/dl): Meter #: Oximetry (%) Taken: Pre 08:00 118/80 65 18 97.8 297 1 99 Post 10:34 128/80 70 18 98 215 1 98 Treatment Response Treatment Toleration: Well Treatment Completion Status: Treatment Completed without Adverse Event Electronic Signature(s) Signed: 01/17/2023 12:50:42 PM By: [name] Signed: 01/17/2023 2:24:34 PM  By: Brian Andre PA-C Entered By: [name] on 01/17/2023 11:17:42 [name] ([id number]) [id number] Page 2 of 2 -------------------------------------------------------------------------------- HBO Safety Checklist Details Patient Name: Date of Service: Brian ORN, DO [place] LD [name]. 01/17/2023 7:30 [name] Medical Record Number: [id number] Patient Account Number: [id number] Date of Birth/Sex: Treating RN: 11-07-1955 (68 y.o. M) Primary Care Vanissa Strength: [name] Other Clinician: Myriam [name] Referring [name]: Treating [name]/Extender: [name] Weeks in Treatment: 3 HBO Safety Checklist Items Safety Checklist Consent Form Signed Patient voided / foley secured and emptied When did you last eato 01/16/22 Last dose of injectable or oral agent 01/16/22 Ostomy pouch emptied and vented if applicable NA All implantable devices assessed, documented and approved NA Intravenous access site secured and place NA Valuables secured Linens and cotton and cotton/polyester blend (less than 51% polyester) Personal oil-based products / skin lotions / body lotions removed Wigs or hairpieces removed NA Smoking or tobacco materials removed NA Books / newspapers / magazines / loose paper removed Cologne, aftershave, perfume and deodorant removed Jewelry removed (may wrap wedding band) Make-up removed NA Hair care products removed Battery operated devices (external) removed NA Heating patches and chemical warmers removed NA Titanium eyewear removed NA Nail polish cured greater than 10 hours NA Casting material cured greater than 10 hours NA Hearing aids removed NA Loose dentures or partials removed NA Prosthetics have been removed NA Patient demonstrates correct use of air break device (if applicable) Patient concerns have been addressed Patient grounding bracelet on and cord attached to chamber Specifics for Inpatients (complete in  addition to above) Medication sheet sent with patient NA Intravenous medications needed or due during therapy sent with patient NA Drainage tubes (e.g. nasogastric tube or chest tube secured and vented) NA Endotracheal or Tracheotomy tube secured NA Cuff deflated of  air and inflated with saline NA Airway suctioned NA Electronic Signature(s) Signed: 01/17/2023 12:50:42 PM By: [name] Entered By: [name] on 01/17/2023 11:15:29

## 2023-01-17 NOTE — Progress Notes (Addendum)
 Brian, BACHA [name] ([id number]) [id number] Page 1 of 9 Visit Report for 01/17/2023 Arrival Information Details Patient Name: Date of Service: Brian ORN, DO [place] LD [name]. 01/17/2023 10:30 [name] Medical Record Number: [id number] Patient Account Number: [id number] Date of Birth/Sex: Treating RN: 07-30-1955 (68 y.o. [name] Primary Care Wally Shevchenko: Sampson Scales Other Clinician: Referring Toan Mort: Treating Otniel Hoe/Extender: Bethena Andre Sampson Scales Devra in Treatment: 3 Visit Information History Since Last Visit Added or deleted any medications: No Patient Arrived: Ambulatory Any new allergies or adverse reactions: No Arrival Time: 10:46 Had [name] fall or experienced change in No Accompanied By: self activities of daily living that may affect Transfer Assistance: None risk of falls: Patient Identification Verified: Yes Hospitalized since last visit: No Secondary Verification Process Completed: Yes Has Dressing in Place as Prescribed: Yes Patient Requires Transmission-Based Precautions: No Pain Present Now: No Patient Has Alerts: Yes Patient Alerts: Patient on Blood Thinner Diabetic Type 2 Eliquis  Electronic Signature(s) Signed: 01/17/2023 1:23:59 PM By: [name] Entered By: [name] on 01/17/2023 07:46:32 -------------------------------------------------------------------------------- Clinic Level of Care Assessment Details Patient Name: Date of Service: Brian ORN, DO NA LD [name]. 01/17/2023 10:30 [name] Medical Record Number: [id number] Patient Account Number: [id number] Date of Birth/Sex: Treating RN: 16-Feb-1955 (68 y.o. [name] Primary Care Kenedy Haisley: Sampson Scales Other Clinician: Referring [name]: Treating [name]/Extender: Bethena Andre Sampson Scales Devra in Treatment: 3 Clinic Level of Care Assessment Items TOOL 1 Quantity Score []  - 0 Use when EandM and Procedure is performed on INITIAL visit ASSESSMENTS  - Nursing Assessment / Reassessment []  - 0 General Physical Exam (combine w/ comprehensive assessment (listed just below) when performed on new pt. evals) []  - 0 Comprehensive Assessment (HX, ROS, Risk Assessments, Wounds Hx, etc.) ASSESSMENTS - Wound and Skin Assessment / Reassessment []  - 0 Dermatologic / Skin Assessment (not related to wound area) SONNY, ANTHES [name] ([id number]) [id number] Page 2 of 9 ASSESSMENTS - Ostomy and/or Continence Assessment and Care []  - 0 Incontinence Assessment and Management []  - 0 Ostomy Care Assessment and Management (repouching, etc.) PROCESS - Coordination of Care []  - 0 Simple Patient / Family Education for ongoing care []  - 0 Complex (extensive) Patient / Family Education for ongoing care []  - 0 Staff obtains Chiropractor, Records, T Results / Process Orders est []  - 0 Staff telephones HHA, Nursing Homes / Clarify orders / etc []  - 0 Routine Transfer to another Facility (non-emergent condition) []  - 0 Routine Hospital Admission (non-emergent condition) []  - 0 New Admissions / Manufacturing Engineer / Ordering NPWT Apligraf, etc. , []  - 0 Emergency Hospital Admission (emergent condition) PROCESS - Special Needs []  - 0 Pediatric / Minor Patient Management []  - 0 Isolation Patient Management []  - 0 Hearing / Language / Visual special needs []  - 0 Assessment of Community assistance (transportation, D/C planning, etc.) []  - 0 Additional assistance / Altered mentation []  - 0 Support Surface(s) Assessment (bed, cushion, seat, etc.) INTERVENTIONS - Miscellaneous []  - 0 External ear exam []  - 0 Patient Transfer (multiple staff / Nurse, Adult / Similar devices) []  - 0 Simple Staple / Suture removal (25 or less) []  - 0 Complex Staple / Suture removal (26 or more) []  - 0 Hypo/Hyperglycemic Management (do not check if billed separately) []  - 0 Ankle / Brachial Index (ABI) - do not check if billed separately Has  the patient been seen at the hospital within the last three years: Yes Total Score: 0 Level Of Care: ____ Electronic Signature(s) Signed: 01/17/2023  1:23:59 PM By: [name] Entered By: [name] on 01/17/2023 08:20:23 -------------------------------------------------------------------------------- Encounter Discharge Information Details Patient Name: Date of Service: Brian ORN, DO NA LD [name]. 01/17/2023 10:30 [name] Medical Record Number: [id number] Patient Account Number: [id number] Date of Birth/Sex: Treating RN: 10/16/1955 (68 y.o. [name] Primary Care [name]: Sampson Scales Other Clinician: Referring [name]: Treating [name]/Extender: Bethena Andre Sampson Scales Devra in Treatment: 3 Encounter Discharge Information Items Post Procedure Vitals COYE, DAWOOD [name] ([id number]) [id number] Page 3 of 9 Discharge Condition: Stable Temperature (F): 98 Ambulatory Status: Ambulatory Pulse (bpm): 70 Discharge Destination: Home Respiratory Rate (breaths/min): 18 Transportation: Private Auto Blood Pressure (mmHg): 128/80 Accompanied By: self Schedule Follow-up Appointment: Yes Clinical Summary of Care: Electronic Signature(s) Signed: 01/17/2023 11:22:47 AM By: [name] Entered By: [name] on 01/17/2023 08:22:47 -------------------------------------------------------------------------------- Lower Extremity Assessment Details Patient Name: Date of Service: Brian ORN, DO NA LD [name]. 01/17/2023 10:30 [name] Medical Record Number: [id number] Patient Account Number: [id number] Date of Birth/Sex: Treating RN: [birth year] (68 y.o. [name] Primary Care [name]: Sampson Scales Other Clinician: Referring [name]: Treating [name]/Extender: Bethena Andre Sampson Scales Weeks in Treatment: 3 Edema Assessment Assessed: [Left: No] [Right: No] Edema: [Left: N] [Right: o] Vascular Assessment Pulses: Dorsalis  Pedis Palpable: [Right:Yes] Extremity colors, hair growth, and conditions: Extremity Color: [Right:Normal] Hair Growth on Extremity: [Right:No] Temperature of Extremity: [Right:Cool < 3 seconds] Toe Nail Assessment Left: Right: Thick: Yes Discolored: No Deformed: No Improper Length and Hygiene: No Electronic Signature(s) Signed: 01/17/2023 1:23:59 PM By: [name] Entered By: [name] on 01/17/2023 07:50:41 Lechuga, Foch [name] ([id number]) [id number] Page 4 of 9 -------------------------------------------------------------------------------- Multi Wound Chart Details Patient Name: Date of Service: Brian ORN, DO NA LD [name]. 01/17/2023 10:30 [name] Medical Record Number: [id number] Patient Account Number: [id number] Date of Birth/Sex: Treating RN: 07/05/1955 (68 y.o. [name] Primary Care Cozetta Seif: Sampson Scales Other Clinician: Referring Terren Haberle: Treating Ahniya Mitchum/Extender: Bethena Andre Sampson Scales Weeks in Treatment: 3 Vital Signs Height(in): 69 Pulse(bpm): 70 Weight(lbs): 210 Blood Pressure(mmHg): 128/80 Body Mass Index(BMI): 31 Temperature(F): 98 Respiratory Rate(breaths/min): 18 [1:Photos:] [N/[name]:N/[name]] Right, Anterior T Second oe N/[name] N/[name] Wound Location: Gradually Appeared N/[name] N/[name] Wounding Event: Diabetic Wound/Ulcer of the Lower N/[name] N/[name] Primary Etiology: Extremity Hypertension, Type II Diabetes N/[name] N/[name] Comorbid History: 11/27/2022 N/[name] N/[name] Date Acquired: 3 N/[name] N/[name] Weeks of Treatment: Open N/[name] N/[name] Wound Status: No N/[name] N/[name] Wound Recurrence: 1.9x0.7x0.1 N/[name] N/[name] Measurements L x W x D (cm) 1.045 N/[name] N/[name] [name] (cm) : rea 0.104 N/[name] N/[name] Volume (cm) : 44.60% N/[name] N/[name] % Reduction in [name] rea: 72.40% N/[name] N/[name] % Reduction in Volume: Grade 3 N/[name] N/[name] Classification: Medium N/[name] N/[name] Exudate [name] mount: Serosanguineous N/[name] N/[name] Exudate Type: red, brown N/[name] N/[name] Exudate Color: Medium (34-66%) N/[name] N/[name] Granulation [name]  mount: Pink N/[name] N/[name] Granulation Quality: Medium (34-66%) N/[name] N/[name] Necrotic [name] mount: Eschar, Adherent Slough N/[name] N/[name] Necrotic Tissue: Fat Layer (Subcutaneous Tissue): Yes N/[name] N/[name] Exposed Structures: Fascia: No Tendon: No Muscle: No Joint: No Bone: No None N/[name] N/[name] Epithelialization: Treatment Notes Electronic Signature(s) Signed: 01/17/2023 1:23:59 PM By: [name] Entered By: [name] on 01/17/2023 08:03:44 WRIGHT GOLAS [name] ([id number]) [id number] Page 5 of 9 -------------------------------------------------------------------------------- Multi-Disciplinary Care Plan Details Patient Name: Date of Service: Brian ORN, DO [place] [name]. 01/17/2023 10:30 [name] Medical Record Number: [id number] Patient Account Number: [id number] Date of Birth/Sex: Treating RN: October 19, 1955 (68 y.o. [name] Primary Care Aniston Christman: Sampson Scales Other Clinician: Referring Luian Schumpert: Treating Cyniah Gossard/Extender:  Stone, Hoyt Entzminger, Ethridge Weeks in Treatment: 3 Active Inactive Necrotic Tissue Nursing Diagnoses: Impaired tissue integrity related to necrotic/devitalized tissue Knowledge deficit related to management of necrotic/devitalized tissue Goals: Necrotic/devitalized tissue will be minimized in the wound bed Date Initiated: 12/27/2022 Target Resolution Date: 01/27/2023 Goal Status: Active Patient/caregiver will verbalize understanding of reason and process for debridement of necrotic tissue Date Initiated: 12/27/2022 Target Resolution Date: 01/27/2023 Goal Status: Active Interventions: Assess patient pain level pre-, during and post procedure and prior to discharge Provide education on necrotic tissue and debridement process Treatment Activities: Apply topical anesthetic as ordered : 12/27/2022 Excisional debridement : 12/27/2022 Notes: Osteomyelitis Nursing Diagnoses: Infection: osteomyelitis Knowledge deficit related to disease process and  management Goals: Diagnostic evaluation for osteomyelitis completed as ordered Date Initiated: 12/27/2022 Date Inactivated: 01/13/2023 Target Resolution Date: 01/11/2023 Goal Status: Met Patient/caregiver will verbalize understanding of disease process and disease management Date Initiated: 12/27/2022 Date Inactivated: 01/07/2023 Target Resolution Date: 01/03/2023 Goal Status: Met Patient's osteomyelitis will resolve Date Initiated: 12/27/2022 Target Resolution Date: 02/27/2023 Goal Status: Active Signs and symptoms for osteomyelitis will be recognized and promptly addressed Date Initiated: 12/27/2022 Target Resolution Date: 01/11/2023 Goal Status: Active Interventions: Provide education on osteomyelitis Treatment Activities: Consult for HBO : 12/27/2022 MRI : 12/17/2022 T ordered outside of clinic : 12/27/2022 est White Blood Cell Scan : 12/17/2022 KODEY, XUE ([id number]) [phone] Page 6 of 9 Notes: Wound/Skin Impairment Nursing Diagnoses: Impaired tissue integrity Knowledge deficit related to ulceration/compromised skin integrity Goals: Patient/caregiver will verbalize understanding of skin care regimen Date Initiated: 12/27/2022 Target Resolution Date: 01/27/2023 Goal Status: Active Ulcer/skin breakdown will have [name] volume reduction of 30% by week 4 Date Initiated: 12/27/2022 Target Resolution Date: 01/27/2023 Goal Status: Active Ulcer/skin breakdown will have [name] volume reduction of 50% by week 8 Date Initiated: 12/27/2022 Target Resolution Date: 02/27/2023 Goal Status: Active Ulcer/skin breakdown will have [name] volume reduction of 80% by week 12 Date Initiated: 12/27/2022 Target Resolution Date: 03/27/2023 Goal Status: Active Ulcer/skin breakdown will heal within 14 weeks Date Initiated: 12/27/2022 Target Resolution Date: 04/10/2023 Goal Status: Active Interventions: Assess patient/caregiver ability to obtain necessary supplies Assess  patient/caregiver ability to perform ulcer/skin care regimen upon admission and as needed Assess ulceration(s) every visit Provide education on ulcer and skin care Screen for HBO Treatment Activities: Consult for HBO : 12/27/2022 Referred to DME Arelly Whittenberg for dressing supplies : 12/27/2022 Skin care regimen initiated : 12/27/2022 Notes: Electronic Signature(s) Signed: 01/17/2023 11:20:42 AM By: [name] Entered By: [name] on 01/17/2023 08:20:42 -------------------------------------------------------------------------------- Pain Assessment Details Patient Name: Date of Service: Brian ORN, DO NA LD [name]. 01/17/2023 10:30 [name] Medical Record Number: [id number] Patient Account Number: [id number] Date of Birth/Sex: Treating RN: 09-Sep-1955 (68 y.o. [name] Primary Care Psalm Arman: Sampson Ethridge Other Clinician: Referring Ryot Burrous: Treating Leafy Motsinger/Extender: Bethena Andre Sampson Ethridge Weeks in Treatment: 3 Active Problems Location of Pain Severity and Description of Pain Patient Has Paino No Site Locations [place] [name] ([id number]) [id number] Page 7 of 9 Pain Management and Medication Current Pain Management: Electronic Signature(s) Signed: 01/17/2023 1:23:59 PM By: [name] Entered By: [name] on 01/17/2023 07:46:52 -------------------------------------------------------------------------------- Patient/Caregiver Education Details Patient Name: Date of Service: Brian ORN, DO NA LD [name]. 1/6/2025andnbsp10:30 [name] Medical Record Number: [id number] Patient Account Number: [id number] Date of Birth/Gender: Treating RN: 1955/06/26 (68 y.o. [name] Primary Care Physician: Sampson Ethridge Other Clinician: Referring Physician: Treating Physician/Extender: Bethena Andre Sampson Ethridge Devra in Treatment: 3 Education Assessment Education Provided To: Patient Education Topics  Provided Wound  Debridement: Handouts: Wound Debridement Methods: Explain/Verbal Responses: State content correctly Wound/Skin Impairment: Handouts: Caring for Your Ulcer Methods: Explain/Verbal Responses: State content correctly Electronic Signature(s) Signed: 01/17/2023 1:23:59 PM By: [name] Entered By: [name] on 01/17/2023 08:20:56 WRIGHT NANCYANN LABOR ([id number]) [id number] Page 8 of 9 -------------------------------------------------------------------------------- Wound Assessment Details Patient Name: Date of Service: Brian ORN, DO NA LD [name]. 01/17/2023 10:30 [name] Medical Record Number: [id number] Patient Account Number: [id number] Date of Birth/Sex: Treating RN: Nov 09, 1955 (68 y.o. [name] Primary Care Annalisia Ingber: Sampson Scales Other Clinician: Referring Benjamim Harnish: Treating Demarious Kapur/Extender: Bethena Andre Sampson Scales Weeks in Treatment: 3 Wound Status Wound Number: 1 Primary Etiology: Diabetic Wound/Ulcer of the Lower Extremity Wound Location: Right, Anterior T Second oe Wound Status: Open Wounding Event: Gradually Appeared Comorbid History: Hypertension, Type II Diabetes Date Acquired: 11/27/2022 Weeks Of Treatment: 3 Clustered Wound: No Photos Wound Measurements Length: (cm) 1.9 Width: (cm) 0.7 Depth: (cm) 0.1 Area: (cm) 1.045 Volume: (cm) 0.104 % Reduction in Area: 44.6% % Reduction in Volume: 72.4% Epithelialization: None Tunneling: No Undermining: No Wound Description Classification: Grade 3 Exudate Amount: Medium Exudate Type: Serosanguineous Exudate Color: red, brown Foul Odor After Cleansing: No Slough/Fibrino Yes Wound Bed Granulation Amount: Medium (34-66%) Exposed Structure Granulation Quality: Pink Fascia Exposed: No Necrotic Amount: Medium (34-66%) Fat Layer (Subcutaneous Tissue) Exposed: Yes Necrotic Quality: Eschar, Adherent Slough Tendon Exposed: No Muscle Exposed: No Joint Exposed: No Bone  Exposed: No Treatment Notes Wound #1 (Toe Second) Wound Laterality: Right, Anterior Cleanser [place] Ancillary Kit - 15 Day Supply Discharge Instruction: Use supplies as instructed; Kit contains: (15) Saline Bullets; (15) 3x3 Gauze; 15 pr Gloves Soap and [address] ROSHAWN, AYALA [name] ([id number]) [phone].pdf Page 9 of 9 Discharge Instruction: Gently cleanse wound with antibacterial soap, rinse and pat dry prior to dressing wounds Vashe 5.8 (oz) Discharge Instruction: Use vashe 5.8 (oz) as directed Peri-Wound Care Topical Primary Dressing Prisma 4.34 (in) Discharge Instruction: Moisten w/normal saline or sterile water; Cover wound as directed. Do not remove from wound bed. Xeroform-HBD 2x2 (in/in) Discharge Instruction: Apply Xeroform-HBD 2x2 (in/in) as directed Secondary Dressing Secured With Conform 2''- Conforming Stretch Gauze Bandage 2x75 (in/in) Discharge Instruction: Apply as directed Stretch Net Dressing, Latex-free, Size 5, Small-Head / Shoulder / Thigh Discharge Instruction: size 2 used Compression Wrap Compression Stockings Add-Ons Electronic Signature(s) Signed: 01/17/2023 1:23:59 PM By: [name] Entered By: [name] on 01/17/2023 07:53:04 -------------------------------------------------------------------------------- Vitals Details Patient Name: Date of Service: Brian ORN, DO NA LD [name]. 01/17/2023 10:30 [name] Medical Record Number: [id number] Patient Account Number: [id number] Date of Birth/Sex: Treating RN: [birth year] (68 y.o. [name] Primary Care Jaydeen Darley: Sampson Scales Other Clinician: Referring Phelicia Dantes: Treating Charlis Harner/Extender: Bethena Andre Sampson Scales Weeks in Treatment: 3 Vital Signs Time Taken: 10:46 Temperature (F): 98 Height (in): 69 Pulse (bpm): 70 Weight (lbs): 210 Respiratory Rate (breaths/min): 18 Body Mass Index (BMI): 31 Blood Pressure (mmHg): 128/80 Reference Range: 80 - 120 mg /  dl Electronic Signature(s) Signed: 01/17/2023 1:23:59 PM By: [name] Entered By: [name] on 01/17/2023 07:46:46

## 2023-01-17 NOTE — Progress Notes (Signed)
 TREQUAN, MARSOLEK A ([id number]) [id number] Page 1 of 2 Visit Report for 01/17/2023 Arrival Information Details Patient Name: Date of Service: [name], [name] 01/17/2023 7:30 A M Medical Record Number: [id number] Patient Account Number: [id number] Date of Birth/Sex: Treating RN: 25-Feb-1955 (68 y.o. M) Primary Care Tudor Chandley: Sampson Scales Other Clinician: [name] Referring Ilisha Blust: Treating Yancy Hascall/Extender: Bethena Andre Sampson Scales Devra in Treatment: 3 Visit Information History Since Last Visit Added or deleted any medications: No Patient Arrived: Ambulatory Any new allergies or adverse reactions: No Arrival Time: 07:30 Had a fall or experienced change in No Accompanied By: self activities of daily living that may affect Transfer Assistance: None risk of falls: Patient Identification Verified: Yes Signs or symptoms of abuse/neglect since last visito No Secondary Verification Process Completed: Yes Hospitalized since last visit: No Patient Requires Transmission-Based Precautions: No Implantable device outside of the clinic excluding No Patient Has Alerts: Yes cellular tissue based products placed in the center Patient Alerts: Patient on Blood Thinner since last visit: Diabetic Type 2 Pain Present Now: No Eliquis  Electronic Signature(s) Signed: 01/17/2023 12:50:42 PM By: [name] Entered By: [name] on 01/17/2023 08:15:19 -------------------------------------------------------------------------------- Encounter Discharge Information Details Patient Name: Date of Service: [name], DO NA LD A. 01/17/2023 7:30 A M Medical Record Number: [id number] Patient Account Number: [id number] Date of Birth/Sex: Treating RN: [birth year] (68 y.o. M) Primary Care Chenille Toor: Sampson Scales Other Clinician: [name] Referring Maryelizabeth Eberle: Treating Haralambos Yeatts/Extender: Bethena Andre Sampson Scales Devra in Treatment: 3 Encounter  Discharge Information Items Discharge Condition: Stable Ambulatory Status: Ambulatory Discharge Destination: Home Transportation: Private Auto Accompanied By: self Schedule Follow-up Appointment: Yes Clinical Summary of Care: IGNATIUS, KLOOS ([id number]) [phone].pdf Page 2 of 2 Electronic Signature(s) Signed: 01/17/2023 12:50:42 PM By: [name] Entered By: [name] on 01/17/2023 08:18:10 -------------------------------------------------------------------------------- Vitals Details Patient Name: Date of Service: [name], DO NA LD A. 01/17/2023 7:30 A M Medical Record Number: [id number] Patient Account Number: [id number] Date of Birth/Sex: Treating RN: 07-19-1955 (68 y.o. M) Primary Care Zyiere Rosemond: Sampson Scales Other Clinician: [name] Referring Jiaire Rosebrook: Treating Davena Julian/Extender: Bethena Andre Sampson Scales Devra in Treatment: 3 Vital Signs Time Taken: 08:00 Temperature (F): 97.8 Height (in): 69 Pulse (bpm): 65 Weight (lbs): 210 Respiratory Rate (breaths/min): 18 Body Mass Index (BMI): 31 Blood Pressure (mmHg): 118/80 Capillary Blood Glucose (mg/dl): 702 Reference Range: 80 - 120 mg / dl Airway Pulse Oximetry (%): 99 Electronic Signature(s) Signed: 01/17/2023 12:50:42 PM By: [name] Entered By: [name] on 01/17/2023 08:15:24

## 2023-01-18 ENCOUNTER — Encounter: Payer: Medicare HMO | Admitting: Physician Assistant

## 2023-01-18 DIAGNOSIS — E11621 Type 2 diabetes mellitus with foot ulcer: Secondary | ICD-10-CM | POA: Diagnosis not present

## 2023-01-18 LAB — GLUCOSE, CAPILLARY
Glucose-Capillary: 249 mg/dL — ABNORMAL HIGH (ref 70–99)
Glucose-Capillary: 274 mg/dL — ABNORMAL HIGH (ref 70–99)

## 2023-01-18 NOTE — Progress Notes (Signed)
 [name] ([id number]) [id number] Page 1 of 2 Visit Report for 01/18/2023 Arrival Information Details Patient Name: Date of Service: [name], [name]. 01/18/2023 7:30 [name] Medical Record Number: [id number] Patient Account Number: [id number] Date of Birth/Sex: Treating RN: [name], [name] (68 y.o. M) Primary Care [name]: [name] Other Clinician: Referring [name]: Treating [name] [name] in Treatment: 3 Visit Information History Since Last Visit Added or deleted any medications: No Patient Arrived: Ambulatory Any new allergies or adverse reactions: No Arrival Time: 07:30 Had [name] fall or experienced change in No Accompanied By: self activities of daily living that may affect Transfer Assistance: None risk of falls: Patient Identification Verified: Yes Signs or symptoms of abuse/neglect since last visito No Secondary Verification Process Completed: Yes Hospitalized since last visit: No Patient Requires Transmission-Based Precautions: No Implantable device outside of the clinic excluding No Patient Has Alerts: Yes cellular tissue based products placed in the center Patient Alerts: Patient on Blood Thinner since last visit: Diabetic Type 2 Pain Present Now: No Eliquis  Electronic Signature(s) Signed: 01/18/2023 1:10:40 PM By: [name] Entered By: [name] on 01/18/2023 06:13:06 -------------------------------------------------------------------------------- Vitals Details Patient Name: Date of Service: Brian ORN, Brian NA LD [name]. 01/18/2023 7:30 [name] Medical Record Number: [id number] Patient Account Number: [id number] Date of Birth/Sex: Treating RN: [name] (68 y.o. M) Primary Care [name]: [name] Other Clinician: Referring [name]: Treating [name]/Extender: [name] Weeks in Treatment: 3 Vital Signs Time Taken: 07:30 Temperature (F): 98.0 Height (in):  69 Pulse (bpm): 93 Weight (lbs): 210 Respiratory Rate (breaths/min): 18 Body Mass Index (BMI): 31 Blood Pressure (mmHg): 124/80 Capillary Blood Glucose (mg/dl): 750 Reference Range: 80 - 120 mg / dl Airway [name] ([id number]) [phone].pdf Page 2 of 2 Pulse Oximetry (%): 98 Electronic Signature(s) Signed: 01/18/2023 1:10:40 PM By: [name] Entered By: [name] on 01/18/2023 06:13:27

## 2023-01-19 ENCOUNTER — Other Ambulatory Visit: Payer: Self-pay | Admitting: Cardiology

## 2023-01-19 ENCOUNTER — Encounter: Payer: Medicare HMO | Admitting: Physician Assistant

## 2023-01-19 DIAGNOSIS — E11621 Type 2 diabetes mellitus with foot ulcer: Secondary | ICD-10-CM | POA: Diagnosis not present

## 2023-01-19 LAB — GLUCOSE, CAPILLARY
Glucose-Capillary: 320 mg/dL — ABNORMAL HIGH (ref 70–99)
Glucose-Capillary: 208 mg/dL — ABNORMAL HIGH (ref 70–99)

## 2023-01-19 NOTE — Progress Notes (Signed)
 [name], [name] ([id number]) [id number] Page 1 of 2 Visit Report for 01/19/2023 Arrival Information Details Patient Name: Date of Service: [name], [name]. 01/19/2023 7:30 [name] Medical Record Number: [id number] Patient Account Number: [id number] Date of Birth/Sex: Treating RN: 30-Jul-1955 (68 y.o. M) Primary Care Thana Ramp: Sampson Scales Other Clinician: Referring Nataliee Shurtz: Treating Jermar Colter/Extender: Bethena Andre Sampson Scales Devra in Treatment: 3 Visit Information History Since Last Visit Added or deleted any medications: No Patient Arrived: Ambulatory Any new allergies or adverse reactions: No Arrival Time: 07:30 Had [name] fall or experienced change in No Transfer Assistance: None activities of daily living that may affect Patient Identification Verified: Yes risk of falls: Secondary Verification Process Completed: Yes Signs or symptoms of abuse/neglect since last visito No Patient Requires Transmission-Based Precautions: No Hospitalized since last visit: No Patient Has Alerts: Yes Implantable device outside of the clinic excluding No Patient Alerts: Patient on Blood Thinner cellular tissue based products placed in the center Diabetic Type 2 since last visit: Eliquis  Pain Present Now: No Electronic Signature(s) Signed: 01/19/2023 2:32:37 PM By: [name] Entered By: [name] on 01/19/2023 06:21:45 -------------------------------------------------------------------------------- Encounter Discharge Information Details Patient Name: Date of Service: Brian ORN, Brian NA LD [name]. 01/19/2023 7:30 [name] Medical Record Number: [id number] Patient Account Number: [id number] Date of Birth/Sex: Treating RN: 1955/07/11 (68 y.o. M) Primary Care Yaakov Saindon: Sampson Scales Other Clinician: Referring Karmon Andis: Treating Icelynn Onken/Extender: Bethena Andre Sampson Scales Devra in Treatment: 3 Encounter Discharge Information Items Discharge Condition:  Stable Ambulatory Status: Ambulatory Discharge Destination: Home Transportation: Private Auto Accompanied By: self Schedule Follow-up Appointment: Yes Clinical Summary of Care: JAHDIEL, KROL ([id number]) [id number] Page 2 of 2 Electronic Signature(s) Signed: 01/19/2023 2:32:37 PM By: [name] Entered By: [name] on 01/19/2023 08:33:12 -------------------------------------------------------------------------------- Vitals Details Patient Name: Date of Service: Brian ORN, Brian NA LD [name]. 01/19/2023 7:30 [name] Medical Record Number: [id number] Patient Account Number: [id number] Date of Birth/Sex: Treating RN: [birth year] (68 y.o. M) Primary Care Stan Cantave: Sampson Scales Other Clinician: Referring Shequila Neglia: Treating Jayln Branscom/Extender: Bethena Andre Sampson Scales Weeks in Treatment: 3 Vital Signs Time Taken: 07:30 Temperature (F): 98.0 Height (in): 69 Pulse (bpm): 70 Weight (lbs): 210 Respiratory Rate (breaths/min): 18 Body Mass Index (BMI): 31 Blood Pressure (mmHg): 142/80 Capillary Blood Glucose (mg/dl): 679 Reference Range: 80 - 120 mg / dl Airway Pulse Oximetry (%): 99 Electronic Signature(s) Signed: 01/19/2023 2:32:37 PM By: [name] Entered By: [name] on 01/19/2023 06:22:36

## 2023-01-20 ENCOUNTER — Encounter: Payer: Medicare HMO | Admitting: Physician Assistant

## 2023-01-20 DIAGNOSIS — E11621 Type 2 diabetes mellitus with foot ulcer: Secondary | ICD-10-CM | POA: Diagnosis not present

## 2023-01-20 LAB — GLUCOSE, CAPILLARY
Glucose-Capillary: 250 mg/dL — ABNORMAL HIGH (ref 70–99)
Glucose-Capillary: 193 mg/dL — ABNORMAL HIGH (ref 70–99)

## 2023-01-20 NOTE — Progress Notes (Signed)
 SAVEON, PLANT [name] ([id number]) [id number] Page 1 of 2 Visit Report for 01/19/2023 HBO Details Patient Name: Date of Service: [name]. 01/19/2023 7:30 [name] Medical Record Number: [id number] Patient Account Number: [id number] Date of Birth/Sex: Treating RN: [birth year] (68 y.o. 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, wagoner grade 3 oe Notes: by MRI HBO Treatment Details Treatment Number: 10 Patient Type: Outpatient Chamber Type: Monoplace Chamber Serial #: [id number] Treatment Protocol: 2.0 ATA with 90 minutes oxygen, and no air breaks Treatment Details Compression Rate Down: 2.0 psi / minute De-Compression Rate Up: 1.5 psi / minute Air breaks and breathing Decompress Decompress Compress Tx Pressure Begins Reached periods Begins Ends (leave unused spaces blank) Chamber Pressure (ATA 1 2 ------2 1 ) Clock Time (24 hr) 8:04 8:13 - - - - - - 9:44 9:53 Treatment Length: 109 (minutes) Treatment Segments: 4 Vital Signs Capillary Blood Glucose Reference Range: 80 - 120 mg / dl HBO Diabetic Blood Glucose Intervention Range: <131 mg/dl or >750 mg/dl Time Vitals Blood Respiratory Capillary Blood Glucose Pulse Action Type: Pulse: Temperature: Taken: Pressure: Rate: Glucose (mg/dl): Meter #: Oximetry (%) Taken: Pre 07:30 142/80 70 18 98 320 1 99 Post 09:54 134/80 68 18 97.8 208 1 98 Treatment Response Treatment Toleration: Well Treatment Completion Status: Treatment Completed without Adverse Event Electronic Signature(s) Signed: 01/19/2023 2:32:37 PM By: [name] Signed: 01/19/2023 6:16:28 PM By: Bethena Andre  PA-C Entered By: [name] on 01/19/2023 08:32:40 [name] ([id number]) [id number] Page 2 of 2 -------------------------------------------------------------------------------- HBO Safety Checklist Details Patient Name: Date of Service: [name]. 01/19/2023 7:30 [name] Medical Record Number: [id number] Patient Account Number: [id number] Date of Birth/Sex: Treating RN: 05/09/1955 (68 y.o. M) Primary Care Jontavius Rabalais: Sampson Scales Other Clinician: Referring Kobyn Kray: Treating Mayo Owczarzak/Extender: Bethena Andre Sampson Scales Weeks in Treatment: 3 HBO Safety Checklist Items Safety Checklist Consent Form Signed Patient voided / foley secured and emptied When did you last eato 01/18/22 Last dose of injectable or oral agent 01/18/22 Ostomy pouch emptied and vented if applicable NA All implantable devices assessed, documented and approved NA Intravenous access site secured and place NA Valuables secured Linens and cotton and cotton/polyester blend (less than 51% polyester) Personal oil-based products / skin lotions / body lotions removed Wigs or hairpieces removed NA Smoking or tobacco materials removed NA Books / newspapers / magazines / loose paper removed Cologne, aftershave, perfume and deodorant removed Jewelry removed (may wrap wedding band) Make-up removed NA Hair care products removed Battery operated devices (external) removed NA Heating patches and chemical warmers removed NA Titanium eyewear removed NA Nail polish cured greater than 10 hours NA Casting material cured greater than 10 hours NA Hearing aids removed NA Loose dentures or partials removed NA Prosthetics have been removed NA Patient demonstrates correct use of air break device (if applicable) Patient concerns have been addressed Patient grounding bracelet on and cord attached to chamber Specifics for Inpatients (complete in addition to above) Medication  sheet sent with patient NA Intravenous medications needed or due during therapy sent with patient NA Drainage tubes (e.g. nasogastric tube or chest tube secured and vented) NA Endotracheal or Tracheotomy tube secured NA Cuff deflated of air and inflated with  saline NA Airway suctioned NA Electronic Signature(s) Signed: 01/19/2023 2:32:37 PM By: [name] Entered By: [name] on 01/19/2023 06:23:26

## 2023-01-20 NOTE — Progress Notes (Signed)
 Brian, SANDLER [name] ([id number]) [id number] Page 1 of 2 Visit Report for 01/18/2023 HBO Details Patient Name: Date of Service: [name] [place] LD [name]. 01/18/2023 7:30 [name] Medical Record Number: [id number] Patient Account Number: [id number] Date of Birth/Sex: Treating RN: 06/04/1955 (68 y.o. 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, wagoner grade 3 oe Notes: by MRI HBO Treatment Details Treatment Number: 9 Patient Type: Outpatient Chamber Type: Monoplace Chamber Serial #: [id number] Treatment Protocol: 2.0 ATA with 90 minutes oxygen, and no air breaks Treatment Details Compression Rate Down: 2.0 psi / minute De-Compression Rate Up: 1.5 psi / minute Air breaks and breathing Decompress Decompress Compress Tx Pressure Begins Reached periods Begins Ends (leave unused spaces blank) Chamber Pressure (ATA 1 2 ------2 1 ) Clock Time (24 hr) 7:57 8:08 - - - - - - 9:38 9:48 Treatment Length: 111 (minutes) Treatment Segments: 4 Vital Signs Capillary Blood Glucose Reference Range: 80 - 120 mg / dl HBO Diabetic Blood Glucose Intervention Range: <131 mg/dl or >750 mg/dl Time Vitals Blood Respiratory Capillary Blood Glucose Pulse Action Type: Pulse: Temperature: Taken: Pressure: Rate: Glucose (mg/dl): Meter #: Oximetry (%) Taken: Pre 07:30 124/80 93 18 98 249 1 98 Post 09:49 128/78 64 18 97.8 274 1 98 Treatment Response Treatment Toleration: Well Treatment Completion Status: Treatment Completed without Adverse Event Electronic Signature(s) Signed: 01/18/2023 1:10:40 PM By: [name] Signed: 01/19/2023 6:16:28 PM By: Bethena Andre  PA-C Entered By: [name] on 01/18/2023 08:19:35 Tokunaga, Tadarrius [name] ([id number]) [id number] Page 2 of 2 -------------------------------------------------------------------------------- HBO Safety Checklist Details Patient Name: Date of Service: [name] [place] LD [name]. 01/18/2023 7:30 [name] Medical Record Number: [id number] Patient Account Number: [id number] Date of Birth/Sex: Treating RN: 10-16-1955 (68 y.o. M) Primary Care Storm Dulski: Sampson Scales Other Clinician: Referring Stevi Hollinshead: Treating Ixchel Duck/Extender: Bethena Andre Sampson Scales Weeks in Treatment: 3 HBO Safety Checklist Items Safety Checklist Consent Form Signed Patient voided / foley secured and emptied When did you last eato 01/18/23 Last dose of injectable or oral agent 01/18/23 Ostomy pouch emptied and vented if applicable NA All implantable devices assessed, documented and approved NA Intravenous access site secured and place NA Valuables secured Linens and cotton and cotton/polyester blend (less than 51% polyester) Personal oil-based products / skin lotions / body lotions removed Wigs or hairpieces removed NA Smoking or tobacco materials removed NA Books / newspapers / magazines / loose paper removed Cologne, aftershave, perfume and deodorant removed Jewelry removed (may wrap wedding band) Make-up removed NA Hair care products removed Battery operated devices (external) removed NA Heating patches and chemical warmers removed NA Titanium eyewear removed NA Nail polish cured greater than 10 hours NA Casting material cured greater than 10 hours NA Hearing aids removed NA Loose dentures or partials removed NA Prosthetics have been removed NA Patient demonstrates correct use of air break device (if applicable) Patient concerns have been addressed Patient grounding bracelet on and cord attached to chamber Specifics for Inpatients (complete in addition to above) Medication  sheet sent with patient NA Intravenous medications needed or due during therapy sent with patient NA Drainage tubes (e.g. nasogastric tube or chest tube secured and vented) NA Endotracheal or Tracheotomy tube secured NA Cuff deflated of air and inflated with  saline NA Airway suctioned NA Electronic Signature(s) Signed: 01/18/2023 1:10:40 PM By: [name] Entered By: [name] on 01/18/2023 08:18:44

## 2023-01-21 ENCOUNTER — Encounter: Payer: Medicare HMO | Admitting: Physician Assistant

## 2023-01-21 DIAGNOSIS — E11621 Type 2 diabetes mellitus with foot ulcer: Secondary | ICD-10-CM | POA: Diagnosis not present

## 2023-01-21 LAB — GLUCOSE, CAPILLARY
Glucose-Capillary: 340 mg/dL — ABNORMAL HIGH (ref 70–99)
Glucose-Capillary: 224 mg/dL — ABNORMAL HIGH (ref 70–99)

## 2023-01-21 NOTE — Progress Notes (Signed)
 KEVON, TENCH A ([id number]) [id number] Page 1 of 2 Visit Report for 01/20/2023 Physician Orders Details Patient Name: Date of Service: [name], [name] 01/20/2023 7:30 A M Medical Record Number: [id number] Patient Account Number: [id number] Date of Birth/Sex: Treating RN: [birth year] (69 y.o. M) Primary Care Provider: Sampson Scales Other Clinician: Referring Provider: Treating Provider/Extender: Bethena Andre Sampson Scales Devra in Treatment: 3 Verbal / Phone Orders: No Diagnosis Coding Wound Treatment Consults Ear, Nose Throat - I am referring my patient Janie Strothman to you for evaluation of His Ears for Tub Placement . Patient is currently in treatment with Hyperbaric Oxygen Therapy, with complaints of clogged ears. Electronic Signature(s) Signed: 01/20/2023 3:53:53 PM By: Bethena Andre PA-C Signed: 01/21/2023 8:25:18 AM By: [name] Entered By: [name] on 01/20/2023 05:16:40 -------------------------------------------------------------------------------- SuperBill Details Patient Name: Date of Service: [name], DO NA LD A. 01/20/2023 Medical Record Number: [id number] Patient Account Number: [id number] Date of Birth/Sex: Treating RN: March 02, 1955 (68 y.o. 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 AROLDO, GALLI A ([id number]) [id number] Page 2 of 2 Facility Procedures : CPT4 Code: 58699997 Description: (Facility Use Only) HBOT full body  chamber, , Modifier: Quantity: 4 Physician Procedures : CPT4: Description Modifier Code 00816 [id number] Physician attendance and supervision of hyperbaric oxygen therapy, per sessionsupervision of hyperbaric Oxygen therapy, per session ICD-10 Diagnosis Description L97.512 Non-pressure chronic ulcer of other part  of right foot with fat layer exposed E11.621 Type 2 diabetes mellitus with foot ulcer Quantity: 1 Electronic Signature(s) Signed: 01/20/2023 3:53:53 PM By: Bethena Andre PA-C Signed: 01/21/2023 8:25:18 AM By: [name] Entered By: [name] on 01/20/2023 08:36:54

## 2023-01-21 NOTE — Progress Notes (Signed)
 [name], [name] ([id number]) [id number] Page 1 of 2 Visit Report for 01/21/2023 HBO Details Patient Name: Date of Service: [name], [name]. 01/21/2023 7:30 [name] Medical Record Number: [id number] Patient Account Number: [id number] Date of Birth/Sex: Treating RN: [birth year] (68 y.o. 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, wagoner grade 3 oe Notes: by MRI HBO Treatment Details Treatment Number: 12 Patient Type: Outpatient Chamber Type: Monoplace Chamber Serial #: [id number] Treatment Protocol: 2.0 ATA with 90 minutes oxygen, and no air breaks Treatment Details Compression Rate Down: 2.0 psi / minute De-Compression Rate Up: 1.5 psi / minute Air breaks and breathing Decompress Decompress Compress Tx Pressure Begins Reached periods Begins Ends (leave unused spaces blank) Chamber Pressure (ATA 1 2 ------2 1 ) Clock Time (24 hr) 7:51 8:05 - - - - - - 9:35 9:49 Treatment Length: 118 (minutes) Treatment Segments: 4 Vital Signs Capillary Blood Glucose Reference Range: 80 - 120 mg / dl HBO Diabetic Blood Glucose Intervention Range: <131 mg/dl or >750 mg/dl Time Vitals Blood Respiratory Capillary Blood Glucose Pulse Action Type: Pulse: Temperature: Taken: Pressure: Rate: Glucose (mg/dl): Meter #: Oximetry (%) Taken: Pre 07:30 124/80 70 18 98 340 1 99 Post 09:49 126/80 68 18 98.1 224 1 98 Treatment Response Treatment Toleration: Well Treatment Completion Status: Treatment Completed without Adverse Event Electronic Signature(s) Signed: 01/21/2023 11:43:16 AM By: [name] Signed: 01/21/2023 12:58:57 PM By: Bethena Andre PA-C Entered By: [name] on 01/21/2023 11:00:21 WRIGHT GOLAS [name] ([id number]) [id number] Page 2 of 2 -------------------------------------------------------------------------------- HBO Safety Checklist Details Patient Name: Date of Service: [name], [name]. 01/21/2023 7:30 [name] Medical Record Number: [id number] Patient Account Number: [id number] Date of Birth/Sex: Treating RN: March 17, 1955 (68 y.o. M) Primary Care Wenona [place]: Sampson Scales Other Clinician: Referring Asal Teas: Treating Jyquan Kenley/Extender: Bethena Andre Sampson Scales Weeks in Treatment: 3 HBO Safety Checklist Items Safety Checklist Consent Form Signed Patient voided / foley secured and emptied When did you last eato 01/21/23 Last dose of injectable or oral agent 01/21/23 Ostomy pouch emptied and vented if applicable NA All implantable devices assessed, documented and approved NA Intravenous access site secured and place NA Valuables secured Linens and cotton and cotton/polyester blend (less than 51% polyester) Personal oil-based products / skin lotions / body lotions removed Wigs or hairpieces removed NA Smoking or tobacco materials removed NA Books / newspapers / magazines / loose paper removed Cologne, aftershave, perfume and deodorant removed Jewelry removed (may wrap wedding band) Make-up removed NA Hair care products removed Battery operated devices (external) removed NA Heating patches and chemical warmers removed NA Titanium eyewear removed NA Nail polish cured greater than 10 hours NA Casting material cured greater than 10 hours NA Hearing aids removed NA Loose dentures or partials removed NA Prosthetics have been removed NA Patient demonstrates correct use of air break device (if applicable) Patient concerns have been addressed Patient grounding bracelet on and cord attached to chamber Specifics for Inpatients (complete in addition to  above) Medication sheet sent with patient NA Intravenous medications needed or due during therapy sent with patient NA Drainage tubes (e.g. nasogastric tube or chest tube secured and vented) NA Endotracheal or Tracheotomy tube secured NA Cuff deflated of air and inflated with  saline NA Airway suctioned NA Electronic Signature(s) Signed: 01/21/2023 11:43:16 AM By: [name] Entered By: [name] on 01/21/2023 10:58:59

## 2023-01-21 NOTE — Progress Notes (Signed)
 [name], DENAULT A ([id number]) [id number] Page 1 of 2 Visit Report for 01/20/2023 HBO Details Patient Name: Date of Service: Brian ORN, [name] 01/20/2023 7:30 A M Medical Record Number: [id number] Patient Account Number: [id number] Date of Birth/Sex: Treating RN: May 28, 1955 (68 y.o. 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, wagoner grade 3 oe Notes: by MRI HBO Treatment Details Treatment Number: 11 Patient Type: Outpatient Chamber Type: Monoplace Chamber Serial #: [id number] Treatment Protocol: 2.0 ATA with 90 minutes oxygen, and no air breaks Treatment Details Compression Rate Down: 2.0 psi / minute De-Compression Rate Up: 1.0 psi / minute Air breaks and breathing Decompress Decompress Compress Tx Pressure Begins Reached periods Begins Ends (leave unused spaces blank) Chamber Pressure (ATA 1 2 ------2 1 ) Clock Time (24 hr) 7:59 8:14 - - - - - - 9:49 9:58 Treatment Length: 119 (minutes) Treatment Segments: 4 Vital Signs Capillary Blood Glucose Reference Range: 80 - 120 mg / dl HBO Diabetic Blood Glucose Intervention Range: <131 mg/dl or >750 mg/dl Time Vitals Blood Respiratory Capillary Blood Glucose Pulse Action Type: Pulse: Temperature: Taken: Pressure: Rate: Glucose (mg/dl): Meter #: Oximetry (%) Taken: Pre 07:30 138/80 60 18 97.8 250 1 99 Post 09:59 118/80 76 18 98 193 1 98 Treatment Response Treatment Toleration: Well Treatment Completion Status: Treatment Completed without Adverse Event Treatment Notes Patient stated he could not equalize the pressure in ears./ [name] notified referral to ENT for  tubes Electronic Signature(s) Signed: 01/20/2023 3:53:53 PM By: Bethena Andre PA-C Signed: 01/21/2023 8:25:18 AM By: [name] [place] By: [name] on 01/20/2023 08:36:42 WRIGHT [name] ([id number]) [id number] Page 2 of 2 -------------------------------------------------------------------------------- HBO Safety Checklist Details Patient Name: Date of Service: Brian ORN, [name] 01/20/2023 7:30 A M Medical Record Number: [id number] Patient Account Number: [id number] Date of Birth/Sex: Treating RN: [birth year] (68 y.o. M) Primary Care Aurorah Schlachter: Sampson Scales Other Clinician: Referring Aleynah Rocchio: Treating [name]/Extender: Bethena Andre Sampson Scales Weeks in Treatment: 3 HBO Safety Checklist Items Safety Checklist Consent Form Signed Patient voided / foley secured and emptied When did you last eato 01/20/23 Last dose of injectable or oral agent 01/20/23 Ostomy pouch emptied and vented if applicable NA All implantable devices assessed, documented and approved NA Intravenous access site secured and place NA Valuables secured Linens and cotton and cotton/polyester blend (less than 51% polyester) Personal oil-based products / skin lotions / body lotions removed Wigs or hairpieces removed NA Smoking or tobacco materials removed NA Books / newspapers / magazines / loose paper removed Cologne, aftershave, perfume and deodorant removed Jewelry removed (may wrap wedding band) Make-up removed NA Hair care products removed Battery operated devices (external) removed NA Heating patches and chemical warmers removed NA Titanium eyewear removed NA Nail polish cured greater than 10 hours NA Casting material cured greater than 10 hours NA Hearing aids removed NA Loose dentures or partials removed NA Prosthetics have been removed NA Patient demonstrates correct use of air break device (if applicable) Patient concerns have been addressed Patient  grounding bracelet on and cord attached to chamber Specifics for Inpatients (complete in addition to above) Medication sheet sent with patient NA Intravenous medications needed or due during therapy sent with patient NA Drainage tubes (e.g. nasogastric tube or  chest tube secured and vented) NA Endotracheal or Tracheotomy tube secured NA Cuff deflated of air and inflated with saline NA Airway suctioned NA Electronic Signature(s) Signed: 01/21/2023 8:25:18 AM By: Brian Cramp Entered By: Brian Cramp on 01/20/2023 08:36:35

## 2023-01-21 NOTE — Progress Notes (Signed)
 RODRIC, PUNCH A ([id number]) [id number] Page 1 of 2 Visit Report for 01/20/2023 Arrival Information Details Patient Name: Date of Service: [name], [name] 01/20/2023 7:30 A M Medical Record Number: [id number] Patient Account Number: [id number] Date of Birth/Sex: Treating RN: September 08, 1955 (68 y.o. M) Primary Care Merril Nagy: Sampson Scales Other Clinician: Referring Deane Wattenbarger: Treating Marissa Weaver/Extender: Bethena Andre Sampson Scales Devra in Treatment: 3 Visit Information History Since Last Visit Added or deleted any medications: No Patient Arrived: Ambulatory Any new allergies or adverse reactions: No Arrival Time: 07:30 Had a fall or experienced change in No Accompanied By: self activities of daily living that may affect Transfer Assistance: None risk of falls: Patient Identification Verified: Yes Signs or symptoms of abuse/neglect since last visito No Secondary Verification Process Completed: Yes Hospitalized since last visit: No Patient Requires Transmission-Based Precautions: No Implantable device outside of the clinic excluding No Patient Has Alerts: Yes cellular tissue based products placed in the center Patient Alerts: Patient on Blood Thinner since last visit: Diabetic Type 2 Pain Present Now: No Eliquis  Electronic Signature(s) Signed: 01/21/2023 8:25:18 AM By: [name] Entered By: [name] on 01/20/2023 08:36:29 -------------------------------------------------------------------------------- Encounter Discharge Information Details Patient Name: Date of Service: [name], DO NA LD A. 01/20/2023 7:30 A M Medical Record Number: [id number] Patient Account Number: [id number] Date of Birth/Sex: Treating RN: 04/16/1955 (68 y.o. M) Primary Care Yaslene Lindamood: Sampson Scales Other Clinician: Referring Safiya Girdler: Treating Kendyl Bissonnette/Extender: Bethena Andre Sampson Scales Devra in Treatment: 3 Encounter Discharge Information  Items Discharge Condition: Stable Ambulatory Status: Ambulatory Discharge Destination: Home Transportation: Private Auto Accompanied By: self Schedule Follow-up Appointment: Yes Clinical Summary of Care: YANDELL, MCJUNKINS ([id number]) [phone] Page 2 of 2 Electronic Signature(s) Signed: 01/21/2023 8:25:18 AM By: [name] Entered By: [name] on 01/20/2023 08:37:00 -------------------------------------------------------------------------------- Vitals Details Patient Name: Date of Service: [name], DO NA LD A. 01/20/2023 7:30 A M Medical Record Number: [id number] Patient Account Number: [id number] Date of Birth/Sex: Treating RN: 1955-04-09 (68 y.o. M) Primary Care Adena Sima: Sampson Scales Other Clinician: Referring Francene Mcerlean: Treating Vana Arif/Extender: Bethena Andre Sampson Scales Weeks in Treatment: 3 Vital Signs Time Taken: 07:30 Temperature (F): 97.8 Height (in): 69 Pulse (bpm): 60 Weight (lbs): 210 Respiratory Rate (breaths/min): 18 Body Mass Index (BMI): 31 Blood Pressure (mmHg): 138/80 Capillary Blood Glucose (mg/dl): 749 Reference Range: 80 - 120 mg / dl Airway Pulse Oximetry (%): 99 Electronic Signature(s) Signed: 01/21/2023 8:25:18 AM By: [name] Entered By: [name] on 01/20/2023 08:36:32

## 2023-01-21 NOTE — Progress Notes (Signed)
 CALIX, HEINBAUGH [name] ([id number]) [id number] Page 1 of 2 Visit Report for 01/21/2023 Arrival Information Details Patient Name: Date of Service: [name], [name]. 01/21/2023 7:30 [name] Medical Record Number: [id number] Patient Account Number: [id number] Date of Birth/Sex: Treating RN: December 20, 1955 (68 y.o. M) Primary Care Sukari Grist: Sampson Scales Other Clinician: Referring Xcaret Morad: Treating Leldon Steege/Extender: Bethena Andre Sampson Scales Devra in Treatment: 3 Visit Information History Since Last Visit Added or deleted any medications: No Patient Arrived: Ambulatory Any new allergies or adverse reactions: No Arrival Time: 07:30 Had [name] fall or experienced change in No Accompanied By: self activities of daily living that may affect Transfer Assistance: None risk of falls: Patient Identification Verified: Yes Signs or symptoms of abuse/neglect since last visito No Secondary Verification Process Completed: Yes Hospitalized since last visit: No Patient Requires Transmission-Based Precautions: No Implantable device outside of the clinic excluding No Patient Has Alerts: Yes cellular tissue based products placed in the center Patient Alerts: Patient on Blood Thinner since last visit: Diabetic Type 2 Pain Present Now: No Eliquis  Electronic Signature(s) Signed: 01/21/2023 11:43:16 AM By: [name] Entered By: [name] on 01/21/2023 09:33:39 -------------------------------------------------------------------------------- Encounter Discharge Information Details Patient Name: Date of Service: [name], [name]. 01/21/2023 7:30 [name] Medical Record Number: [id number] Patient Account Number: [id number] Date of Birth/Sex: Treating RN: 1955/03/16 (68 y.o. M) Primary Care Jodeci Rini: Sampson Scales Other Clinician: Referring [place] Warriner: Treating Ramiah Helfrich/Extender: Bethena Andre Sampson Scales Devra in Treatment: 3 Encounter Discharge Information  Items Discharge Condition: Stable Ambulatory Status: Ambulatory Discharge Destination: Home Transportation: Private Auto Accompanied By: self Schedule Follow-up Appointment: Yes Clinical Summary of Care: [name], [name] ([id number]) [phone].pdf Page 2 of 2 Electronic Signature(s) Signed: 01/21/2023 11:43:16 AM By: [name] Entered By: [name] on 01/21/2023 11:00:44 -------------------------------------------------------------------------------- Vitals Details Patient Name: Date of Service: [name], [name]. 01/21/2023 7:30 [name] Medical Record Number: [id number] Patient Account Number: [id number] Date of Birth/Sex: Treating RN: 04/13/1955 (68 y.o. M) Primary Care Mikaelyn Arthurs: Sampson Scales Other Clinician: Referring Keeanna Villafranca: Treating Arica Bevilacqua/Extender: Bethena Andre Sampson Scales Weeks in Treatment: 3 Vital Signs Time Taken: 07:30 Temperature (F): 98.0 Height (in): 69 Pulse (bpm): 70 Weight (lbs): 210 Respiratory Rate (breaths/min): 18 Body Mass Index (BMI): 31 Blood Pressure (mmHg): 124/80 Capillary Blood Glucose (mg/dl): 659 Reference Range: 80 - 120 mg / dl Airway Pulse Oximetry (%): 99 Electronic Signature(s) Signed: 01/21/2023 11:43:16 AM By: [name] Entered By: [name] on 01/21/2023 10:58:57

## 2023-01-24 ENCOUNTER — Ambulatory Visit: Payer: Medicare HMO | Admitting: Physician Assistant

## 2023-01-24 ENCOUNTER — Encounter: Payer: Medicare HMO | Admitting: Physician Assistant

## 2023-01-25 ENCOUNTER — Encounter: Payer: Medicare HMO | Admitting: Physician Assistant

## 2023-01-25 DIAGNOSIS — E11621 Type 2 diabetes mellitus with foot ulcer: Secondary | ICD-10-CM | POA: Diagnosis not present

## 2023-01-25 LAB — GLUCOSE, CAPILLARY
Glucose-Capillary: 256 mg/dL — ABNORMAL HIGH (ref 70–99)
Glucose-Capillary: 171 mg/dL — ABNORMAL HIGH (ref 70–99)

## 2023-01-26 ENCOUNTER — Encounter: Payer: Medicare HMO | Admitting: Physician Assistant

## 2023-01-26 DIAGNOSIS — E11621 Type 2 diabetes mellitus with foot ulcer: Secondary | ICD-10-CM | POA: Diagnosis not present

## 2023-01-26 LAB — GLUCOSE, CAPILLARY
Glucose-Capillary: 170 mg/dL — ABNORMAL HIGH (ref 70–99)
Glucose-Capillary: 130 mg/dL — ABNORMAL HIGH (ref 70–99)

## 2023-01-26 NOTE — Progress Notes (Signed)
 VENTURA, HOLLENBECK [name] ([id number]) [id number] Page 1 of 9 Visit Report for 01/25/2023 Chief Complaint Document Details Patient Name: Date of Service: Brian ORN, Brian NA LD [name]. 01/25/2023 8:45 [name] Medical Record Number: [id number] Patient Account Number: [id number] Date of Birth/Sex: Treating RN: [birth year] (68 y.o. [name]) [name] Primary Care Provider: Sampson Scales Other Clinician: Referring Provider: Treating Provider/Extender: Bethena Andre Sampson Scales Weeks in Treatment: 4 Information Obtained from: Patient Chief Complaint Right second toe ulcer Electronic Signature(s) Signed: 01/25/2023 9:46:52 AM By: Bethena Andre PA-C Entered By: Bethena Andre on 01/25/2023 09:46:52 -------------------------------------------------------------------------------- Debridement Details Patient Name: Date of Service: Brian ORN, Brian NA LD [name]. 01/25/2023 8:45 [name] Medical Record Number: [id number] Patient Account Number: [id number] Date of Birth/Sex: Treating RN: June 23, 1955 (68 y.o. [name]) [name] Primary Care Provider: Sampson Scales Other Clinician: Referring Provider: Treating Provider/Extender: Bethena Andre Sampson Scales Devra in Treatment: 4 Debridement Performed for Assessment: Wound #1 Right,Anterior T Second oe Performed By: Physician Bethena Andre, PA-C The following information was scribed by: [name] The information was scribed for: Bethena Andre Debridement Type: Debridement Severity of Tissue Pre Debridement: Fat layer exposed Level of Consciousness (Pre-procedure): Awake and Alert Pre-procedure Verification/Time Out Yes - 09:40 Taken: Start Time: 09:40 Percent of Wound Bed Debrided: 100% T Area Debrided (cm): otal 0.82 Tissue and other material debrided: Viable, Non-Viable, Slough, Subcutaneous, Skin: Dermis , Skin: Epidermis, Slough Level: Skin/Subcutaneous Tissue Debridement Description: Excisional Instrument: Curette Bleeding:  Minimum Hemostasis Achieved: Pressure JOESPH, MARCY [name] ([id number]) [id number] Page 2 of 9 End Time: 09:43 Procedural Pain: 0 Post Procedural Pain: 0 Response to Treatment: Procedure was tolerated well Level of Consciousness (Post- Awake and Alert procedure): Post Debridement Measurements of Total Wound Length: (cm) 1.5 Width: (cm) 0.7 Depth: (cm) 0.1 Volume: (cm) 0.082 Character of Wound/Ulcer Post Debridement: Improved Severity of Tissue Post Debridement: Fat layer exposed Post Procedure Diagnosis Same as Pre-procedure Electronic Signature(s) Signed: 01/25/2023 2:57:41 PM By: Brian Sailors RN Signed: 01/25/2023 4:44:02 PM By: Bethena Andre PA-C Entered By: [name] on 01/25/2023 09:44:20 -------------------------------------------------------------------------------- HPI Details Patient Name: Date of Service: Brian ORN, Brian NA LD [name]. 01/25/2023 8:45 [name] Medical Record Number: [id number] Patient Account Number: [id number] Date of Birth/Sex: Treating RN: 1955/11/29 (68 y.o. [name] Primary Care Provider: Sampson Scales Other Clinician: Referring Provider: Treating Provider/Extender: Bethena Andre Sampson Scales Weeks in Treatment: 4 History of Present Illness Chronic/Inactive Conditions Condition 1: 12-27-2022 patient's ABI in the office was 0.92 on the right. With that being said he did have [name] bilateral lower extremity arterial duplex evaluation and this was on December 16, 2022. The result of this showed abnormal left extremity arterial Doppler consistent with stenosis within the distal superficial femoral and popliteal arteries. CTA of the abdominal aorta including lower extremity runoffs is recommended. There was stated to be no significant plaque formation visualized within the arteries of the right lower extremity HPI Description: 12-27-2022 upon evaluation today patient presents for initial inspection here in the clinic as [name]  referral from his primary care provider, Dr. Sampson. With that being said I did discuss with the patient the treatment that is been undertaken up to this point as well as reviewing what I could in epic although I could not see any of the actual PCP notes it sounds as if she has been doing an awesome job with regard to the care of the patient up to this point. He does have osteomyelitis of the second toe right foot. With that being said there  is [name] wound here as well there is some muscle/tendon exposed some of this necrotic as well over the area in question. 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 I Brian believe that he has had excellent care and has been on antibiotics since actually 8 November initially he was placed on it appears doxycycline and then transition to Bactrim which is what he is still on currently. Fortunately he has seen some signs of improvement with regard to the infection which I think is definitely good but I think he still at high risk as far as amputation of the toe is concerned and I discussed that with him today as well. Overall I believe that he is [name] strong candidate for hyperbaric oxygen therapy to try to get his wound to heal. I discussed with the patient how this can increase his chances of healing from around 50% with what we have going on right now to closer to the 85% with HBO therapy. This is obviously [name] fairly significant shift and though not 100% gives him the best chance I think to try to get this wound healed without progressing to further intervention such as amputation. Patient does have [name] history of diabetes mellitus type 2, hypertension, atrial fibrillation, long-term use of anticoagulant therapy, and he does have [name] cardiac pacemaker. I did review notes through epic. He has had [name] recent EKG which did not appear to be significantly different from an EKG even about [name] month ago which was good news. This EKG was  performed 12-17-2022 and the when it was compared to was 11-19-2022 and 10-25-2022. Subsequently the patient also has undergone an arterial lower extremity duplex which showed on 12-15-2022 that there was no significant plaque formation visualized within the arteries of the right lower extremity. It would appear that he has sufficient arterial flow to heal although he does have further follow-up with vascular as well. This is mainly in regard to his left lower extremity. He did have an MRI on December 10, 2022 which shows that he does have osteomyelitis which was stated to be acute at that point of the middle phalanx of the second toe right foot. Again he has been on antibiotics since November 19, 2022. 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 I did review the patient's CBC and currently white blood cell count was 9.0 hemoglobin 12.6 hematocrit 37.9 otherwise everything was within normal limits. I did also review the basic metabolic panel which showed that the patient does have elevated creatinine with [name] normal BUN. Estimated GFR was 44. Upon reviewing notes on 12-17-2022 also came across the note where the patient has had an echo on March 05, 2022 which did demonstrated left ventricular ejection fraction estimated to be 60 to 65% with normal function of the left ventricle and no regional wall abnormalities. There was just mild ventricular hypertrophy. Patient's most recent hemoglobin A1c was November 04, 2022 and was at 9.0. KRRISH, FREUND [name] ([id number]) [id number] Page 3 of 9 12/27; this is [name] patient with [name] difficult wound over the dorsal surface of his right second toe. He had an MRI on 11/24 showing acute osteomyelitis in the middle of phalanx and either reactive osteitis or early acute osteomyelitis involving the proximal phalanx. He tells me he has completed antibiotics but I am really not sure of the duration of  treatment. I Brian not see any relevant cultures in epic. He  started HBO on Monday and said he had some ear pain in his first dive but feels better lately although his ears feel somewhat stuffy. No pain We have been using Prisma as the primary dressing. He has [name] surgical shoe that would preclude any pressure or friction on the involved area. The patient had [name] CT angiogram and by [facility] with and without contrast on 12/21. This did not show any acute abnormality within the abdomen or pelvis no hemodynamically significant peripheral arterial disease in either lower extremity. 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 Aortic atherosclerosis but nothing that seems to indicate an adequate flow for healing the wound we are concerned with 01-13-23 upon evaluation today patient appears to be doing well currently in regard to his wound. He is showing signs of improvement and this does seem to be doing quite well. Fortunately I Brian not see any evidence of active infection at this time which is great news. 01-17-23 upon evaluation today patient appears to be doing well currently in regard to his toe ulcer. This is showing signs of improvement I am very pleased with where we stand. I Brian not see any evidence of worsening and I feel like slowly but surely this wound is getting smaller I Brian believe he has been helped by the hyperbaric oxygen therapy along with good wound care. 01-24-2022 upon evaluation today patient's wound is actually showing signs of excellent improvement he continues to make progress towards complete closure. Fortunately I Brian not see any evidence of active infection locally or systemically which is great news and overall I Brian believe that making good headway here towards closure. Electronic Signature(s) Signed: 01/25/2023 10:05:50 AM By: Bethena Ferraris PA-C Entered By: Bethena Ferraris on 01/25/2023  10:05:50 -------------------------------------------------------------------------------- Physical Exam Details Patient Name: Date of Service: Brian ORN, Brian NA LD [name]. 01/25/2023 8:45 [name] Medical Record Number: [id number] Patient Account Number: [id number] Date of Birth/Sex: Treating RN: February 11, 1955 (68 y.o. [name] Primary Care Provider: Sampson Scales Other Clinician: Referring Provider: Treating Provider/Extender: Bethena Ferraris Sampson Scales Weeks in Treatment: 4 Constitutional Well-nourished and well-hydrated in no acute distress. Respiratory normal breathing without difficulty. Psychiatric this patient is able to make decisions and demonstrates good insight into disease process. Alert and Oriented x 3. pleasant and cooperative. Notes Upon evaluation patient's wound bed actually showed signs of excellent granulation and epithelization at this point. I Brian believe that he is making really good progress here towards closure and I feel like that we are doing awesome at this point. I did perform debridement clearway necrotic debris he tolerated that today without complication postdebridement wound bed is significantly improved. Electronic Signature(s) Signed: 01/25/2023 10:09:00 AM By: Bethena Ferraris PA-C Entered By: Bethena Ferraris on 01/25/2023 10:08:59 [name] ([id number]) [id number] Page 4 of 9 -------------------------------------------------------------------------------- Physician Orders Details Patient Name: Date of Service: Brian ORN, Brian NA LD [name]. 01/25/2023 8:45 [name] Medical Record Number: [id number] Patient Account Number: [id number] Date of Birth/Sex: Treating RN: 12-Jun-1955 (68 y.o. [name]) [name] Primary Care Provider: Sampson Scales Other Clinician: Referring Provider: Treating Provider/Extender: Bethena Ferraris Sampson Scales Devra in Treatment: 4 The following information was scribed by: [name] The information  was scribed for: Bethena Ferraris Verbal / Phone Orders: No Diagnosis Coding Follow-up Appointments Return Appointment in 1 week. Bathing/ Shower/ Hygiene May shower; gently cleanse wound with antibacterial soap, rinse and pat dry prior to dressing wounds Anesthetic (Use 'Patient Medications' Section for Anesthetic Order Entry) Lidocaine  applied to  wound bed Hyperbaric Oxygen Therapy Wound #1 Right,Anterior T Second oe Evaluate for HBO Therapy Indication and location: - Right 2nd T Ulcer, wagoner grade 3 by MRI oe If appropriate for treatment, begin HBOT per protocol: 2.0 ATA for 90 Minutes without [name] Breaks ir One treatment per day (delivered Monday through Friday unless otherwise specified in Special Instructions below): Total # of Treatments: - 40 [name] ntihistamine 30 minutes prior to HBO Treatment, difficulty clearing ears. Finger stick Blood Glucose Pre- and Post- HBOT Treatment. Follow Hyperbaric Oxygen Glycemia Protocol Wound Treatment Wound #1 - T Second oe Wound Laterality: Right, Anterior Cleanser: Byram Ancillary Kit - 15 Day Supply (Generic) Every Other Day/30 Days Discharge Instructions: Use supplies as instructed; Kit contains: (15) Saline Bullets; (15) 3x3 Gauze; 15 pr Gloves Cleanser: Soap and Water Every Other Day/30 Days Discharge Instructions: Gently cleanse wound with antibacterial soap, rinse and pat dry prior to dressing wounds Cleanser: Vashe 5.8 (oz) Every Other Day/30 Days Discharge Instructions: Use vashe 5.8 (oz) as directed Prim Dressing: Gauze Every Other Day/30 Days ary Discharge Instructions: As directed: dry, moistened with saline or moistened with Dakins Solution Prim Dressing: Promogran Matrix 4.34 (in) Every Other Day/30 Days ary Discharge Instructions: Moisten w/normal saline or sterile water; Cover wound as directed. Brian not remove from wound bed. Prim Dressing: Xeroform-HBD 2x2 (in/in) Every Other Day/30 Days ary Discharge Instructions: Apply  Xeroform-HBD 2x2 (in/in) as directed Secured With: Medipore T - 34M Medipore H Soft Cloth Surgical T ape ape, 2x2 (in/yd) Every Other Day/30 Days Secured With: [employer], Latex-free, Size 5, Small-Head / Shoulder / Thigh Every Other Day/30 Days Discharge Instructions: size 2 used GLYCEMIA INTERVENTIONS PROTOCOL PRE-HBO GLYCEMIA INTERVENTIONS ACTION INTERVENTION Obtain pre-HBO capillary blood glucose (ensure [name], [name] ([id number]) [id number] Page 5 of 9 Obtain pre-HBO capillary blood glucose (ensure 1 physician order is in chart). [name]. Notify HBO physician and await physician orders. 2 If result is 70 mg/dl or below: B. If the result meets the hospital definition of [name] critical result, follow hospital policy. [name]. Give patient an 8 ounce Glucerna Shake, an 8 ounce Ensure, or 8 ounces of [name] Glucerna/Ensure equivalent dietary supplement*. B. Wait 30 minutes. If result is 71 mg/dl to 869 mg/dl: C. Retest patients capillary blood glucose (CBG). D. If result greater than or equal to 110 mg/dl, proceed with HBO. If result less than 110 mg/dl, notify HBO physician and consider holding HBO. If result is 131 mg/dl to 750 mg/dl: [name]. Proceed with HBO. [name]. Notify HBO physician and await physician orders. B. It is recommended to hold HBO and Brian If result is 250 mg/dl or greater: blood/urine ketone testing. C. If the result meets the hospital definition of [name] critical result, follow hospital policy. POST-HBO GLYCEMIA INTERVENTIONS ACTION INTERVENTION Obtain post HBO capillary blood glucose (ensure 1 physician order is in chart). [name]. Notify HBO physician and await physician orders. 2 If result is 70 mg/dl or below: B. If the result meets the hospital definition of [name] critical result, follow hospital policy. [name]. Give patient an 8 ounce Glucerna Shake, an 8 ounce Ensure, or 8 ounces of [name] Glucerna/Ensure equivalent dietary supplement*. B. Wait 15  minutes for symptoms of If result is 71 mg/dl to 899 mg/dl: hypoglycemia (i.e. nervousness, anxiety, sweating, chills, clamminess, irritability, confusion, tachycardia or dizziness). C. If patient asymptomatic, discharge patient. If patient symptomatic, repeat capillary blood glucose (CBG) and notify HBO physician. If result is 101 mg/dl to 750 mg/dl: [name]. Discharge patient. [name]. Notify  HBO physician and await physician orders. B. It is recommended to Brian blood/urine ketone If result is 250 mg/dl or greater: testing. C. If the result meets the hospital definition of [name] critical result, follow hospital policy. *Juice or candies are NOT equivalent products. If patient refuses the Glucerna or Ensure, please consult the hospital dietitian for an appropriate substitute. Electronic Signature(s) Signed: 01/25/2023 2:57:41 PM By: Brian Sailors RN Signed: 01/25/2023 4:44:02 PM By: Bethena Ferraris PA-C Entered By: [name] on 01/25/2023 09:45:59 -------------------------------------------------------------------------------- Problem List Details Patient Name: Date of Service: Brian ORN, Brian NA LD [name]. 01/25/2023 8:45 [name] Medical Record Number: [id number] Patient Account Number: [id number] Date of Birth/Sex: Treating RN: February 08, 1955 (68 y.o. [name] Primary Care Provider: Sampson Scales Other Clinician: Referring Provider: Treating Provider/Extender: Bethena Ferraris Sampson Scales Devra in Treatment: 4 Active Problems [name], [name] ([id number]) [id number] Page 6 of 9 ICD-10 Encounter Code Description Active Date MDM Diagnosis E11.621 Type 2 diabetes mellitus with foot ulcer 12/27/2022 No Yes L97.512 Non-pressure chronic ulcer of other part of right foot with fat layer exposed 12/27/2022 No Yes I10 Essential (primary) hypertension 12/27/2022 No Yes I48.0 Paroxysmal atrial fibrillation 12/27/2022 No Yes Z79.01 Long term (current) use of anticoagulants  12/27/2022 No Yes Z95.0 Presence of cardiac pacemaker 12/27/2022 No Yes Inactive Problems Resolved Problems Electronic Signature(s) Signed: 01/25/2023 9:44:58 AM By: Bethena Ferraris PA-C Entered By: Bethena Ferraris on 01/25/2023 09:44:58 -------------------------------------------------------------------------------- Progress Note Details Patient Name: Date of Service: Brian ORN, Brian NA LD [name]. 01/25/2023 8:45 [name] Medical Record Number: [id number] Patient Account Number: [id number] Date of Birth/Sex: Treating RN: 1955/06/12 (68 y.o. [name]) [name] Primary Care Provider: Sampson Scales Other Clinician: Referring Provider: Treating Provider/Extender: Bethena Ferraris Sampson Scales Weeks in Treatment: 4 Subjective Chief Complaint Information obtained from Patient Right second toe ulcer History of Present Illness (HPI) Chronic/Inactive Condition: 12-27-2022 patient's ABI in the office was 0.92 on the right. With that being said he did have [name] bilateral lower extremity arterial duplex evaluation and this was on December 16, 2022. The result of this showed abnormal left extremity arterial Doppler consistent with stenosis within the distal superficial femoral and popliteal arteries. CTA of the abdominal aorta including lower extremity runoffs is recommended. There was stated to be no significant plaque formation visualized within the arteries of the right lower extremity 12-27-2022 upon evaluation today patient presents for initial inspection here in the clinic as [name] referral from his primary care provider, Dr. Sampson. With that being said I did discuss with the patient the treatment that is been undertaken up to this point as well as reviewing what I could in epic although I could not see any of the actual PCP notes it sounds as if she has been doing an awesome job with regard to the care of the patient up to this point. He does have osteomyelitis of the second toe right foot. With that being said  there is [name] wound here as well there is some muscle/tendon exposed some of this necrotic as [name], [name] ([id number]) [id number] Page 7 of 9 well over the area in question. 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 I Brian believe that he has had excellent care and has been on antibiotics since actually 8 November initially he was placed on it appears doxycycline and then transition to Bactrim which is what he is still on currently. Fortunately he has seen some signs of improvement with regard to  the infection which I think is definitely good but I think he still at high risk as far as amputation of the toe is concerned and I discussed that with him today as well. Overall I believe that he is [name] strong candidate for hyperbaric oxygen therapy to try to get his wound to heal. I discussed with the patient how this can increase his chances of healing from around 50% with what we have going on right now to closer to the 85% with HBO therapy. This is obviously [name] fairly significant shift and though not 100% gives him the best chance I think to try to get this wound healed without progressing to further intervention such as amputation. Patient does have [name] history of diabetes mellitus type 2, hypertension, atrial fibrillation, long-term use of anticoagulant therapy, and he does have [name] cardiac pacemaker. I did review notes through epic. He has had [name] recent EKG which did not appear to be significantly different from an EKG even about [name] month ago which was good news. This EKG was performed 12-17-2022 and the when it was compared to was 11-19-2022 and 10-25-2022. Subsequently the patient also has undergone an arterial lower extremity duplex which showed on 12-15-2022 that there was no significant plaque formation visualized within the arteries of the right lower extremity. It would appear that he has sufficient arterial flow to heal although he  does have further follow-up with vascular as well. This is mainly in regard to his left lower extremity. He did have an MRI on December 10, 2022 which shows that he does have osteomyelitis which was stated to be acute at that point of the middle phalanx of the second toe right foot. Again he has been on antibiotics since November 19, 2022. 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 I did review the patient's CBC and currently white blood cell count was 9.0 hemoglobin 12.6 hematocrit 37.9 otherwise everything was within normal limits. I did also review the basic metabolic panel which showed that the patient does have elevated creatinine with [name] normal BUN. Estimated GFR was 44. Upon reviewing notes on 12-17-2022 also came across the note where the patient has had an echo on March 05, 2022 which did demonstrated left ventricular ejection fraction estimated to be 60 to 65% with normal function of the left ventricle and no regional wall abnormalities. There was just mild ventricular hypertrophy. Patient's most recent hemoglobin A1c was November 04, 2022 and was at 9.0. 12/27; this is [name] patient with [name] difficult wound over the dorsal surface of his right second toe. He had an MRI on 11/24 showing acute osteomyelitis in the middle of phalanx and either reactive osteitis or early acute osteomyelitis involving the proximal phalanx. He tells me he has completed antibiotics but I am really not sure of the duration of treatment. I Brian not see any relevant cultures in epic. He started HBO on Monday and said he had some ear pain in his first dive but feels better lately although his ears feel somewhat stuffy. No pain We have been using Prisma as the primary dressing. He has [name] surgical shoe that would preclude any pressure or friction on the involved area. The patient had [name] CT angiogram and by [facility] with and without contrast on 12/21. This did not show any acute  abnormality within the abdomen or pelvis no hemodynamically significant peripheral arterial disease in either lower extremity. There is [name] chronic dissection involving the right common  iliac. Aortic atherosclerosis but nothing that seems to indicate an adequate flow for healing the wound we are concerned with 01-13-23 upon evaluation today patient appears to be doing well currently in regard to his wound. He is showing signs of improvement and this does seem to be doing quite well. Fortunately I Brian not see any evidence of active infection at this time which is great news. 01-17-23 upon evaluation today patient appears to be doing well currently in regard to his toe ulcer. This is showing signs of improvement I am very pleased with where we stand. I Brian not see any evidence of worsening and I feel like slowly but surely this wound is getting smaller I Brian believe he has been helped by the hyperbaric oxygen therapy along with good wound care. 01-24-2022 upon evaluation today patient's wound is actually showing signs of excellent improvement he continues to make progress towards complete closure. Fortunately I Brian not see any evidence of active infection locally or systemically which is great news and overall I Brian believe that making good headway here towards closure. Objective Constitutional Well-nourished and well-hydrated in no acute distress. Vitals Time Taken: 8:59 AM, Height: 69 in, Weight: 210 lbs, BMI: 31, Temperature: 97.9 F, Pulse: 77 bpm, Respiratory Rate: 18 breaths/min, Blood Pressure: 149/79 mmHg. Respiratory normal breathing without difficulty. Psychiatric this patient is able to make decisions and demonstrates good insight into disease process. Alert and Oriented x 3. pleasant and cooperative. General Notes: Upon evaluation patient's wound bed actually showed signs of excellent granulation and epithelization at this point. I Brian believe that he is making really good progress here towards  closure and I feel like that we are doing awesome at this point. I did perform debridement clearway necrotic debris he tolerated that today without complication postdebridement wound bed is significantly improved. Integumentary (Hair, Skin) Wound #1 status is Open. Original cause of wound was Gradually Appeared. The date acquired was: 11/27/2022. The wound has been in treatment 4 weeks. The wound is located on the Right,Anterior T Second. The wound measures 1.5cm length x 0.7cm width x 0.1cm depth; 0.825cm^2 area and 0.082cm^3 volume. oe There is Fat Layer (Subcutaneous Tissue) exposed. There is no tunneling or undermining noted. There is [name] medium amount of serosanguineous drainage noted. There is large (67-100%) pink granulation within the wound bed. There is [name] small (1-33%) amount of necrotic tissue within the wound bed including Adherent Slough. Assessment Active Problems ICD-10 Type 2 diabetes mellitus with foot ulcer Non-pressure chronic ulcer of other part of right foot with fat layer exposed [name], [name] ([id number]) [phone].pdf Page 8 of 9 Essential (primary) hypertension Paroxysmal atrial fibrillation Long term (current) use of anticoagulants Presence of cardiac pacemaker Procedures Wound #1 Pre-procedure diagnosis of Wound #1 is [name] Diabetic Wound/Ulcer of the Lower Extremity located on the Right,Anterior T Second .Severity of Tissue Pre oe Debridement is: Fat layer exposed. There was [name] Excisional Skin/Subcutaneous Tissue Debridement with [name] total area of 0.82 sq cm performed by Bethena Ferraris, PA-C. With the following instrument(s): Curette to remove Viable and Non-Viable tissue/material. Material removed includes Subcutaneous Tissue, Slough, Skin: Dermis, and Skin: Epidermis. No specimens were taken. [name] time out was conducted at 09:40, prior to the start of the procedure. [name] Minimum amount of bleeding was controlled with Pressure. The procedure was  tolerated well with [name] pain level of 0 throughout and [name] pain level of 0 following the procedure. Post Debridement Measurements: 1.5cm length x 0.7cm width x 0.1cm depth; 0.082cm^3  volume. Character of Wound/Ulcer Post Debridement is improved. Severity of Tissue Post Debridement is: Fat layer exposed. Post procedure Diagnosis Wound #1: Same as Pre-Procedure Plan Follow-up Appointments: Return Appointment in 1 week. Bathing/ Shower/ Hygiene: May shower; gently cleanse wound with antibacterial soap, rinse and pat dry prior to dressing wounds Anesthetic (Use 'Patient Medications' Section for Anesthetic Order Entry): Lidocaine  applied to wound bed Hyperbaric Oxygen Therapy: Wound #1 Right,Anterior T Second: oe Evaluate for HBO Therapy Indication and location: - Right 2nd T Ulcer, wagoner grade 3 by MRI oe If appropriate for treatment, begin HBOT per protocol: 2.0 ATA for 90 Minutes without Air Breaks One treatment per day (delivered Monday through Friday unless otherwise specified in Special Instructions below): T # of Treatments: - 40 otal Antihistamine 30 minutes prior to HBO Treatment, difficulty clearing ears. Finger stick Blood Glucose Pre- and Post- HBOT Treatment. Follow Hyperbaric Oxygen Glycemia Protocol WOUND #1: - T Second Wound Laterality: Right, Anterior oe Cleanser: Byram Ancillary Kit - 15 Day Supply (Generic) Every Other Day/30 Days Discharge Instructions: Use supplies as instructed; Kit contains: (15) Saline Bullets; (15) 3x3 Gauze; 15 pr Gloves Cleanser: Soap and Water Every Other Day/30 Days Discharge Instructions: Gently cleanse wound with antibacterial soap, rinse and pat dry prior to dressing wounds Cleanser: Vashe 5.8 (oz) Every Other Day/30 Days Discharge Instructions: Use vashe 5.8 (oz) as directed Prim Dressing: Gauze Every Other Day/30 Days ary Discharge Instructions: As directed: dry, moistened with saline or moistened with Dakins Solution Prim Dressing:  Promogran Matrix 4.34 (in) Every Other Day/30 Days ary Discharge Instructions: Moisten w/normal saline or sterile water; Cover wound as directed. Brian not remove from wound bed. Prim Dressing: Xeroform-HBD 2x2 (in/in) Every Other Day/30 Days ary Discharge Instructions: Apply Xeroform-HBD 2x2 (in/in) as directed Secured With: Medipore T - 42M Medipore H Soft Cloth Surgical T ape ape, 2x2 (in/yd) Every Other Day/30 Days Secured With: [employer] Dressing, Latex-free, Size 5, Small-Head / Shoulder / Thigh Every Other Day/30 Days Discharge Instructions: size 2 used 1. I would recommend that we have the patient continue to monitor for signs of infection or worsening. Based on what I am seeing I Brian believe that her making really good headway here towards closure. 2. I Brian believe that we can continue with the Promogran followed by Xeroform to keep this from drying out. 3. I will recommend that we continue with the roll gauze and stretch next care please. 4. I would also recommend at this time that the patient continue with the hyperbaric oxygen therapy which I think has done excellent for him and is helping to get this wound closed I think we are on the right track here. We will see patient back for reevaluation in 1 week here in the clinic. If anything worsens or changes patient will contact our office for additional recommendations. Electronic Signature(s) Signed: 01/25/2023 10:09:55 AM By: Bethena Ferraris PA-C Entered By: Bethena Ferraris on 01/25/2023 10:09:55 [name] ([id number]) [id number] Page 9 of 9 -------------------------------------------------------------------------------- SuperBill Details Patient Name: Date of Service: Brian ORN, Brian NA LD [name]. 01/25/2023 Medical Record Number: [id number] Patient Account Number: [id number] Date of Birth/Sex: Treating RN: 06-24-1955 (68 y.o. [name]) [name] Primary Care Provider: Sampson Scales Other Clinician: Referring  Provider: Treating Provider/Extender: Bethena Ferraris Sampson Scales Weeks in Treatment: 4 Diagnosis Coding ICD-10 Codes Code Description [id number] Type 2 diabetes mellitus with foot ulcer L97.512 Non-pressure chronic ulcer of other part of right foot with fat layer exposed I10 Essential (primary) hypertension I48.0  Paroxysmal atrial fibrillation Z79.01 Long term (current) use of anticoagulants Z95.0 Presence of cardiac pacemaker Facility Procedures : CPT4 Code: [id number] Description: 11042 - DEB SUBQ TISSUE 20 SQ CM/< ICD-10 Diagnosis Description L97.512 Non-pressure chronic ulcer of other part of right foot with fat layer exposed Modifier: Quantity: 1 Physician Procedures : CPT4 Code Description Modifier 11042 11042 - WC PHYS SUBQ TISS 20 SQ CM ICD-10 Diagnosis Description L97.512 Non-pressure chronic ulcer of other part of right foot with fat layer exposed Quantity: 1 Electronic Signature(s) Signed: 01/25/2023 10:10:02 AM By: Bethena Ferraris PA-C Entered By: Bethena Ferraris on 01/25/2023 10:10:02

## 2023-01-26 NOTE — Progress Notes (Signed)
 ADARRYL, GOLDAMMER A ([id number]) [id number] Page 1 of 2 Visit Report for 01/25/2023 Arrival Information Details Patient Name: Date of Service: [name], [name] 01/25/2023 10:45 A M Medical Record Number: [id number] Patient Account Number: [id number] Date of Birth/Sex: Treating RN: Aug 19, 1955 (68 y.o. M) Primary Care Hunter Bachar: Sampson Scales Other Clinician: [name] Referring Ramata Strothman: Treating Korine Winton/Extender: Bethena Andre Sampson Scales Devra in Treatment: 4 Visit Information History Since Last Visit Added or deleted any medications: No Patient Arrived: Ambulatory Any new allergies or adverse reactions: No Arrival Time: 10:00 Had a fall or experienced change in No Accompanied By: self activities of daily living that may affect Transfer Assistance: None risk of falls: Patient Identification Verified: Yes Signs or symptoms of abuse/neglect since last visito No Secondary Verification Process Completed: Yes Hospitalized since last visit: No Patient Requires Transmission-Based Precautions: No Implantable device outside of the clinic excluding No Patient Has Alerts: Yes cellular tissue based products placed in the center Patient Alerts: Patient on Blood Thinner since last visit: Diabetic Type 2 Pain Present Now: No Eliquis  Electronic Signature(s) Signed: 01/25/2023 12:08:29 PM By: [name] Entered By: [name] on 01/25/2023 11:01:12 -------------------------------------------------------------------------------- Encounter Discharge Information Details Patient Name: Date of Service: [name], DO NA LD A. 01/25/2023 10:45 A M Medical Record Number: [id number] Patient Account Number: [id number] Date of Birth/Sex: Treating RN: December 17, 1955 (68 y.o. M) Primary Care Ledora Delker: Sampson Scales Other Clinician: [name] Referring Terrian Ridlon: Treating Kilee Hedding/Extender: Bethena Andre Sampson Scales Devra in Treatment: 4 Encounter  Discharge Information Items Discharge Condition: Stable Ambulatory Status: Ambulatory Discharge Destination: Home Transportation: Private Auto Accompanied By: self Schedule Follow-up Appointment: Yes Clinical Summary of Care: ADIAN, JABLONOWSKI ([id number]) [id number] Page 2 of 2 Electronic Signature(s) Signed: 01/25/2023 12:08:29 PM By: [name] Entered By: [name] on 01/25/2023 12:08:12 -------------------------------------------------------------------------------- Vitals Details Patient Name: Date of Service: [name], DO NA LD A. 01/25/2023 10:45 A M Medical Record Number: [id number] Patient Account Number: [id number] Date of Birth/Sex: Treating RN: 07/29/1955 (68 y.o. M) Primary Care Miray Mancino: Sampson Scales Other Clinician: [name] Referring Taytum Scheck: Treating Ami Mally/Extender: Bethena Andre Sampson Scales Devra in Treatment: 4 Vital Signs Time Taken: 10:00 Temperature (F): 97.9 Height (in): 69 Pulse (bpm): 77 Weight (lbs): 210 Respiratory Rate (breaths/min): 18 Body Mass Index (BMI): 31 Blood Pressure (mmHg): 149/79 Capillary Blood Glucose (mg/dl): 743 Reference Range: 80 - 120 mg / dl Airway Pulse Oximetry (%): 98 Electronic Signature(s) Signed: 01/25/2023 12:08:29 PM By: [name] Entered By: [name] on 01/25/2023 11:04:02

## 2023-01-26 NOTE — Progress Notes (Signed)
 [name], [name] ([id number]) [id number] Page 1 of 8 Visit Report for 01/25/2023 Arrival Information Details Patient Name: Date of Service: Brian ORN, [name] 01/25/2023 8:45 A M Medical Record Number: [id number] Patient Account Number: [id number] Date of Birth/Sex: Treating RN: 1955/07/26 (68 y.o. [name]) Alverta Sailors Primary Care Missael Ferrari: Sampson Scales Other Clinician: Referring Craigory Toste: Treating Kayelee Herbig/Extender: Bethena Andre Sampson Scales Devra in Treatment: 4 Visit Information History Since Last Visit Added or deleted any medications: No Patient Arrived: Ambulatory Any new allergies or adverse reactions: No Arrival Time: 08:58 Had a fall or experienced change in No Accompanied By: self activities of daily living that may affect Transfer Assistance: None risk of falls: Patient Identification Verified: Yes Signs or symptoms of abuse/neglect since last visito No Secondary Verification Process Completed: Yes Hospitalized since last visit: No Patient Requires Transmission-Based Precautions: No Implantable device outside of the clinic excluding No Patient Has Alerts: Yes cellular tissue based products placed in the center Patient Alerts: Patient on Blood Thinner since last visit: Diabetic Type 2 Has Dressing in Place as Prescribed: Yes Eliquis  Pain Present Now: No Electronic Signature(s) Signed: 01/25/2023 2:57:41 PM By: Alverta Sailors RN Entered By: Alverta Sailors on 01/25/2023 08:59:11 -------------------------------------------------------------------------------- Encounter Discharge Information Details Patient Name: Date of Service: Brian ORN, DO NA LD A. 01/25/2023 8:45 A M Medical Record Number: [id number] Patient Account Number: [id number] Date of Birth/Sex: Treating RN: [birth year] (68 y.o. [name]) Alverta Sailors Primary Care Selyna Klahn: Sampson Scales Other Clinician: Referring Ruhama Lehew: Treating Jillienne Egner/Extender: Bethena Andre Sampson Scales Devra in Treatment: 4 Encounter Discharge Information Items Post Procedure Vitals Discharge Condition: Stable Temperature (F): 97.9 Ambulatory Status: Ambulatory Pulse (bpm): 77 Discharge Destination: Home Respiratory Rate (breaths/min): 18 Transportation: Private Auto Blood Pressure (mmHg): 149/79 Accompanied By: self Schedule Follow-up Appointment: Yes Clinical Summary of Care: JACOBUS, COLVIN A ([id number]) [id number] Page 2 of 8 Electronic Signature(s) Signed: 01/25/2023 11:47:48 AM By: Alverta Sailors RN Entered By: Alverta Sailors on 01/25/2023 11:47:48 -------------------------------------------------------------------------------- Lower Extremity Assessment Details Patient Name: Date of Service: Brian ORN, DO NA LD A. 01/25/2023 8:45 A M Medical Record Number: [id number] Patient Account Number: [id number] Date of Birth/Sex: Treating RN: Aug 28, 1955 (68 y.o. [name]) Alverta Sailors Primary Care Rashun Grattan: Sampson Scales Other Clinician: Referring Kandee Escalante: Treating Intisar Claudio/Extender: Bethena Andre Sampson Scales Weeks in Treatment: 4 Edema Assessment Assessed: Colletta: No] [Right: No] Edema: [Left: N] [Right: o] Vascular Assessment Pulses: Dorsalis Pedis Palpable: [Right:Yes] Extremity colors, hair growth, and conditions: Extremity Color: [Right:Hyperpigmented] Hair Growth on Extremity: [Right:No] Temperature of Extremity: [Right:Warm] Capillary Refill: [Right:< 3 seconds] Dependent Rubor: [Right:No] Blanched when Elevated: [Right:No No] Toe Nail Assessment Left: Right: Thick: Yes Discolored: Yes Deformed: Yes Improper Length and Hygiene: Yes Electronic Signature(s) Signed: 01/25/2023 2:57:41 PM By: Alverta Sailors RN Entered By: Alverta Sailors on 01/25/2023 09:04:40 -------------------------------------------------------------------------------- Multi Wound Chart Details Patient Name: Date of Service: Brian ORN, DO NA LD A. 01/25/2023 8:45  A [name] WRIGHT, [name] A ([id number]) [id number] Page 3 of 8 Medical Record Number: [id number] Patient Account Number: [id number] Date of Birth/Sex: Treating RN: March 09, 1955 (68 y.o. [name]) Alverta Sailors Primary Care Olan Kurek: Sampson Scales Other Clinician: Referring Tamecca Artiga: Treating Tyshawn Keel/Extender: Bethena Andre Sampson Scales Weeks in Treatment: 4 Vital Signs Height(in): 69 Pulse(bpm): 77 Weight(lbs): 210 Blood Pressure(mmHg): 149/79 Body Mass Index(BMI): 31 Temperature(F): 97.9 Respiratory Rate(breaths/min): 18 [1:Photos:] [N/A:N/A] Right, Anterior T Second oe N/A N/A Wound Location: Gradually Appeared N/A N/A Wounding Event: Diabetic Wound/Ulcer of the Lower N/A N/A Primary Etiology: Extremity Hypertension, Type II Diabetes N/A N/A Comorbid History:  11/27/2022 N/A N/A Date Acquired: 4 N/A N/A Weeks of Treatment: Open N/A N/A Wound Status: No N/A N/A Wound Recurrence: 1.5x0.7x0.1 N/A N/A Measurements L x W x D (cm) 0.825 N/A N/A A (cm) : rea 0.082 N/A N/A Volume (cm) : 56.20% N/A N/A % Reduction in A rea: 78.20% N/A N/A % Reduction in Volume: Grade 3 N/A N/A Classification: Medium N/A N/A Exudate A mount: Serosanguineous N/A N/A Exudate Type: red, brown N/A N/A Exudate Color: Large (67-100%) N/A N/A Granulation A mount: Pink N/A N/A Granulation Quality: Small (1-33%) N/A N/A Necrotic A mount: Fat Layer (Subcutaneous Tissue): Yes N/A N/A Exposed Structures: Fascia: No Tendon: No Muscle: No Joint: No Bone: No None N/A N/A Epithelialization: Treatment Notes Electronic Signature(s) Signed: 01/25/2023 2:57:41 PM By: Alverta Sailors RN Entered By: Alverta Sailors on 01/25/2023 09:04:44 -------------------------------------------------------------------------------- Multi-Disciplinary Care Plan Details Patient Name: Date of Service: Brian ORN, DO NA LD A. 01/25/2023 8:45 A M Medical Record Number: [id number] Patient  Account Number: [id number] JAYE, POLIDORI ([id number]) [phone].pdf Page 4 of 8 Date of Birth/Sex: Treating RN: [birth year] (68 y.o. [name]) Alverta Sailors Primary Care Cutter Passey: Other Clinician: Sampson Scales Referring Caster Fayette: Treating Kdyn Vonbehren/Extender: Bethena Andre Sampson Scales Devra in Treatment: 4 Active Inactive Necrotic Tissue Nursing Diagnoses: Impaired tissue integrity related to necrotic/devitalized tissue Knowledge deficit related to management of necrotic/devitalized tissue Goals: Necrotic/devitalized tissue will be minimized in the wound bed Date Initiated: 12/27/2022 Target Resolution Date: 01/27/2023 Goal Status: Active Patient/caregiver will verbalize understanding of reason and process for debridement of necrotic tissue Date Initiated: 12/27/2022 Date Inactivated: 01/25/2023 Target Resolution Date: 01/27/2023 Goal Status: Met Interventions: Assess patient pain level pre-, during and post procedure and prior to discharge Provide education on necrotic tissue and debridement process Treatment Activities: Apply topical anesthetic as ordered : 12/27/2022 Excisional debridement : 12/27/2022 Notes: Osteomyelitis Nursing Diagnoses: Infection: osteomyelitis Knowledge deficit related to disease process and management Goals: Diagnostic evaluation for osteomyelitis completed as ordered Date Initiated: 12/27/2022 Date Inactivated: 01/13/2023 Target Resolution Date: 01/11/2023 Goal Status: Met Patient/caregiver will verbalize understanding of disease process and disease management Date Initiated: 12/27/2022 Date Inactivated: 01/07/2023 Target Resolution Date: 01/03/2023 Goal Status: Met Patient's osteomyelitis will resolve Date Initiated: 12/27/2022 Target Resolution Date: 02/27/2023 Goal Status: Active Signs and symptoms for osteomyelitis will be recognized and promptly addressed Date Initiated: 12/27/2022 Target Resolution Date:  01/11/2023 Goal Status: Active Interventions: Provide education on osteomyelitis Treatment Activities: Consult for HBO : 12/27/2022 MRI : 12/17/2022 T ordered outside of clinic : 12/27/2022 est White Blood Cell Scan : 12/17/2022 Notes: Wound/Skin Impairment Nursing Diagnoses: Impaired tissue integrity Knowledge deficit related to ulceration/compromised skin integrity Goals: Patient/caregiver will verbalize understanding of skin care regimen Date Initiated: 12/27/2022 Target Resolution Date: 01/27/2023 Goal Status: Active Ulcer/skin breakdown will have a volume reduction of 30% by week 4 Date Initiated: 12/27/2022 Target Resolution Date: 01/27/2023 Goal Status: Active ASHLEY, MONTMINY A ([id number]) [phone] Page 5 of 8 Ulcer/skin breakdown will have a volume reduction of 50% by week 8 Date Initiated: 12/27/2022 Target Resolution Date: 02/27/2023 Goal Status: Active Ulcer/skin breakdown will have a volume reduction of 80% by week 12 Date Initiated: 12/27/2022 Target Resolution Date: 03/27/2023 Goal Status: Active Ulcer/skin breakdown will heal within 14 weeks Date Initiated: 12/27/2022 Target Resolution Date: 04/10/2023 Goal Status: Active Interventions: Assess patient/caregiver ability to obtain necessary supplies Assess patient/caregiver ability to perform ulcer/skin care regimen upon admission and as needed Assess ulceration(s) every visit Provide education on ulcer and skin care Screen for HBO Treatment Activities: Consult for  HBO : 12/27/2022 Referred to DME Kailei Cowens for dressing supplies : 12/27/2022 Skin care regimen initiated : 12/27/2022 Notes: Electronic Signature(s) Signed: 01/25/2023 2:57:41 PM By: Alverta Sailors RN Entered By: Alverta Sailors on 01/25/2023 09:05:07 -------------------------------------------------------------------------------- Pain Assessment Details Patient Name: Date of Service: Brian ORN, DO NA LD A. 01/25/2023 8:45 A  M Medical Record Number: [id number] Patient Account Number: [id number] Date of Birth/Sex: Treating RN: 29-Jul-1955 (68 y.o. NETTY Alverta Sailors Primary Care Madysun Thall: Sampson Scales Other Clinician: Referring Kohler Pellerito: Treating Wyn Nettle/Extender: Bethena Andre Sampson Scales Devra in Treatment: 4 Active Problems Location of Pain Severity and Description of Pain Patient Has Paino No Site Locations Pain Management and Medication BURNARD, ENIS A ([id number]) [id number] Page 6 of 8 Current Pain Management: Electronic Signature(s) Signed: 01/25/2023 2:57:41 PM By: Alverta Sailors RN Entered By: Alverta Sailors on 01/25/2023 08:59:38 -------------------------------------------------------------------------------- Patient/Caregiver Education Details Patient Name: Date of Service: Brian ORN, DO NA LD A. 1/14/2025andnbsp8:45 A M Medical Record Number: [id number] Patient Account Number: [id number] Date of Birth/Gender: Treating RN: December 31, 1955 (68 y.o. [name]) Alverta Sailors Primary Care Physician: Sampson Scales Other Clinician: Referring Physician: Treating Physician/Extender: Bethena Andre Sampson Scales Devra in Treatment: 4 Education Assessment Education Provided To: Patient Education Topics Provided Wound Debridement: Handouts: Wound Debridement Methods: Explain/Verbal Responses: State content correctly Electronic Signature(s) Signed: 01/25/2023 2:57:41 PM By: Alverta Sailors RN Entered By: Alverta Sailors on 01/25/2023 09:05:22 -------------------------------------------------------------------------------- Wound Assessment Details Patient Name: Date of Service: Brian ORN, DO NA LD A. 01/25/2023 8:45 A M Medical Record Number: [id number] Patient Account Number: [id number] Date of Birth/Sex: Treating RN: 03-19-1955 (68 y.o. [name]) Alverta Sailors Primary Care Saladin Petrelli: Sampson Scales Other Clinician: Referring Mercer Stallworth: Treating Wynell Halberg/Extender: Bethena Andre Sampson Scales Weeks in Treatment: 4 Wound Status Wound Number: 1 Primary Etiology: Diabetic Wound/Ulcer of the Lower Extremity Wound Location: Right, Anterior T Second oe Wound Status: Open Wounding Event: Gradually Appeared Comorbid History: Hypertension, Type II Diabetes ROMMIE, DUNN A ([id number]) [id number] Page 7 of 8 Date Acquired: 11/27/2022 Weeks Of Treatment: 4 Clustered Wound: No Photos Wound Measurements Length: (cm) 1.5 Width: (cm) 0.7 Depth: (cm) 0.1 Area: (cm) 0.825 Volume: (cm) 0.082 % Reduction in Area: 56.2% % Reduction in Volume: 78.2% Epithelialization: None Tunneling: No Undermining: No Wound Description Classification: Grade 3 Exudate Amount: Medium Exudate Type: Serosanguineous Exudate Color: red, brown Foul Odor After Cleansing: No Slough/Fibrino Yes Wound Bed Granulation Amount: Large (67-100%) Exposed Structure Granulation Quality: Pink Fascia Exposed: No Necrotic Amount: Small (1-33%) Fat Layer (Subcutaneous Tissue) Exposed: Yes Necrotic Quality: Adherent Slough Tendon Exposed: No Muscle Exposed: No Joint Exposed: No Bone Exposed: No Treatment Notes Wound #1 (Toe Second) Wound Laterality: Right, Anterior Cleanser Byram Ancillary Kit - 15 Day Supply Discharge Instruction: Use supplies as instructed; Kit contains: (15) Saline Bullets; (15) 3x3 Gauze; 15 pr Gloves Soap and Water Discharge Instruction: Gently cleanse wound with antibacterial soap, rinse and pat dry prior to dressing wounds Vashe 5.8 (oz) Discharge Instruction: Use vashe 5.8 (oz) as directed Peri-Wound Care Topical Primary Dressing Gauze Discharge Instruction: As directed: dry, moistened with saline or moistened with Dakins Solution Promogran Matrix 4.34 (in) Discharge Instruction: Moisten w/normal saline or sterile water; Cover wound as directed. Do not remove from wound bed. Xeroform-HBD 2x2 (in/in) Discharge Instruction:  Apply Xeroform-HBD 2x2 (in/in) as directed Secondary Dressing Secured With Medipore T - 30M Medipore H Soft Cloth Surgical T ape ape, 2x2 (in/yd) Stretch Net Dressing, Latex-free, Size 5, Small-Head / Shoulder / Thigh Discharge Instruction: size 2 used Compression KODEY, XUE A ([id number])  [id number] Page 8 of 8 Compression Stockings Add-Ons Electronic Signature(s) Signed: 01/25/2023 2:57:41 PM By: Alverta Sailors RN Entered By: Alverta Sailors on 01/25/2023 09:03:40 -------------------------------------------------------------------------------- Vitals Details Patient Name: Date of Service: Brian ORN, DO NA LD A. 01/25/2023 8:45 A M Medical Record Number: [id number] Patient Account Number: [id number] Date of Birth/Sex: Treating RN: 27-Jul-1955 (68 y.o. [name]) Alverta Sailors Primary Care Deyon Chizek: Sampson Scales Other Clinician: Referring Nalu Troublefield: Treating Nariyah Osias/Extender: Bethena Andre Sampson Scales Devra in Treatment: 4 Vital Signs Time Taken: 08:59 Temperature (F): 97.9 Height (in): 69 Pulse (bpm): 77 Weight (lbs): 210 Respiratory Rate (breaths/min): 18 Body Mass Index (BMI): 31 Blood Pressure (mmHg): 149/79 Reference Range: 80 - 120 mg / dl Electronic Signature(s) Signed: 01/25/2023 2:57:41 PM By: Alverta Sailors RN Entered By: Alverta Sailors on 01/25/2023 08:59:31

## 2023-01-26 NOTE — Progress Notes (Signed)
 FABION, GATSON A ([id number]) [id number] Page 1 of 2 Visit Report for 01/25/2023 HBO Details Patient Name: Date of Service: [name], DO NA LD A. 01/25/2023 10:45 A M Medical Record Number: [id number] Patient Account Number: [id number] Date of Birth/Sex: Treating RN: January 24, 1955 (68 y.o. 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, wagoner grade 3 oe Notes: by MRI HBO Treatment Details Treatment Number: 13 Patient Type: Outpatient Chamber Type: Monoplace Chamber Serial #: [id number] Treatment Protocol: 2.0 ATA with 90 minutes oxygen, and no air breaks Treatment Details Compression Rate Down: 2.0 psi / minute De-Compression Rate Up: 1.5 psi / minute Air breaks and breathing Decompress Decompress Compress Tx Pressure Begins Reached periods Begins Ends (leave unused spaces blank) Chamber Pressure (ATA 1 2 ------2 1 ) Clock Time (24 hr) 10:05 10:17 - - - - - - 11:48 11:58 Treatment Length: 113 (minutes) Treatment Segments: 4 Vital Signs Capillary Blood Glucose Reference Range: 80 - 120 mg / dl HBO Diabetic Blood Glucose Intervention Range: <131 mg/dl or >750 mg/dl Time Vitals Blood Respiratory Capillary Blood Glucose Pulse Action Type: Pulse: Temperature: Taken: Pressure: Rate: Glucose (mg/dl): Meter #: Oximetry (%) Taken: Pre 10:00 149/79 77 18 97.9 256 1 98 Post 11:59 132/80 68 18 97.8 171 1 98 Treatment Response Treatment Toleration: Well Treatment Completion Status: Treatment Completed without Adverse Event Electronic Signature(s) Signed: 01/25/2023 12:08:29 PM By: [name] Signed: 01/25/2023  4:44:02 PM By: Bethena Andre PA-C Entered By: [name] on 01/25/2023 12:07:14 WRIGHT [name] ([id number]) [id number] Page 2 of 2 -------------------------------------------------------------------------------- HBO Safety Checklist Details Patient Name: Date of Service: [name], [name] 01/25/2023 10:45 A M Medical Record Number: [id number] Patient Account Number: [id number] Date of Birth/Sex: Treating RN: 12/28/1955 (68 y.o. M) Primary Care Mahkai Fangman: Sampson Scales Other Clinician: [name] Referring Olney Monier: Treating Shawan Corella/Extender: Bethena Andre Sampson Scales Weeks in Treatment: 4 HBO Safety Checklist Items Safety Checklist Consent Form Signed Patient voided / foley secured and emptied When did you last eato 01/25/23 Last dose of injectable or oral agent 01/25/23 Ostomy pouch emptied and vented if applicable NA All implantable devices assessed, documented and approved NA Intravenous access site secured and place NA Valuables secured Linens and cotton and cotton/polyester blend (less than 51% polyester) Personal oil-based products / skin lotions / body lotions removed Wigs or hairpieces removed NA Smoking or tobacco materials removed NA Books / newspapers / magazines / loose paper removed Cologne, aftershave, perfume and deodorant removed Jewelry removed (may wrap wedding band) Make-up removed NA Hair care products removed Battery operated devices (external) removed NA Heating patches and chemical warmers removed NA Titanium eyewear removed NA Nail polish cured greater than 10 hours NA Casting material cured greater than 10 hours NA Hearing aids removed NA Loose dentures or partials removed NA Prosthetics have been removed NA Patient demonstrates correct use of air break device (if applicable) Patient concerns have been addressed Patient grounding bracelet on and cord attached to chamber Specifics for Inpatients  (complete in addition to above) Medication sheet sent with patient NA Intravenous medications needed or due during therapy sent with patient NA Drainage tubes (e.g. nasogastric tube or chest tube secured and vented) NA Endotracheal or Tracheotomy tube secured NA Cuff deflated of  air and inflated with saline NA Airway suctioned NA Electronic Signature(s) Signed: 01/25/2023 12:08:29 PM By: [name] Entered By: [name] on 01/25/2023 12:06:21

## 2023-01-27 ENCOUNTER — Encounter: Payer: Medicare HMO | Admitting: Physician Assistant

## 2023-01-27 DIAGNOSIS — E11621 Type 2 diabetes mellitus with foot ulcer: Secondary | ICD-10-CM | POA: Diagnosis not present

## 2023-01-27 LAB — GLUCOSE, CAPILLARY
Glucose-Capillary: 154 mg/dL — ABNORMAL HIGH (ref 70–99)
Glucose-Capillary: 101 mg/dL — ABNORMAL HIGH (ref 70–99)

## 2023-01-27 NOTE — Progress Notes (Signed)
NICKALOUS, BARBA A ([id number]) [id number] Page 1 of 2 Visit Report for 01/26/2023 HBO Details Patient Name: Date of Service: Arnold Long, [name] 01/26/2023 7:30 A M Medical Record Number: [id number] Patient Account Number: [id number] Date of Birth/Sex: Treating RN: 03-22-1955 (68 y.o. 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, wagoner grade 3 oe Notes: by MRI HBO Treatment Details Treatment Number: 14 Patient Type: Outpatient Chamber Type: Monoplace Chamber Serial #: [id number] Treatment Protocol: 2.0 ATA with 90 minutes oxygen, and no air breaks Treatment Details Compression Rate Down: 2.0 psi / minute De-Compression Rate Up: 1.5 psi / minute Air breaks and breathing Decompress Decompress Compress Tx Pressure Begins Reached periods Begins Ends (leave unused spaces blank) Chamber Pressure (ATA 1 2 ------2 1 ) Clock Time (24 hr) 8:02 8:15 - - - - - - 9:46 9:56 Treatment Length: 114 (minutes) Treatment Segments: 4 Vital Signs Capillary Blood Glucose Reference Range: 80 - 120 mg / dl HBO Diabetic Blood Glucose Intervention Range: <131 mg/dl or >259 mg/dl Time Vitals Blood Respiratory Capillary Blood Glucose Pulse Action Type: Pulse: Temperature: Taken: Pressure: Rate: Glucose (mg/dl): Meter #: Oximetry (%) Taken: Pre 07:30 120/80 80 18 98 170 1 98 Post 09:56 124/80 78 18 97.8 130 1 98 Treatment Response Treatment Toleration: Well Treatment Completion Status: Treatment Completed without Adverse Event Electronic Signature(s) Signed: 01/26/2023 12:26:59 PM By: Demetria Pore Signed: 01/27/2023 6:05:15 PM  By: Allen Derry PA-C Entered By: Demetria Pore on 01/26/2023 11:02:00 [name] ([id number]) [id number] Page 2 of 2 -------------------------------------------------------------------------------- HBO Safety Checklist Details Patient Name: Date of Service: Arnold Long, [name] 01/26/2023 7:30 A M Medical Record Number: [id number] Patient Account Number: [id number] Date of Birth/Sex: Treating RN: 10/27/1955 (68 y.o. M) Primary Care Anala Whisenant: Sumner Boast Other Clinician: Demetria Pore Referring Gayathri Futrell: Treating Lavonne Kinderman/Extender: Milton Ferguson Weeks in Treatment: 4 HBO Safety Checklist Items Safety Checklist Consent Form Signed Patient voided / foley secured and emptied When did you last eato 01/26/23 Last dose of injectable or oral agent 01/26/23 Ostomy pouch emptied and vented if applicable NA All implantable devices assessed, documented and approved NA Intravenous access site secured and place NA Valuables secured Linens and cotton and cotton/polyester blend (less than 51% polyester) Personal oil-based products / skin lotions / body lotions removed Wigs or hairpieces removed NA Smoking or tobacco materials removed NA Books / newspapers / magazines / loose paper removed Cologne, aftershave, perfume and deodorant removed Jewelry removed (may wrap wedding band) Make-up removed NA Hair care products removed Battery operated devices (external) removed NA Heating patches and chemical warmers removed NA Titanium eyewear removed NA Nail polish cured greater than 10 hours NA Casting material cured greater than 10 hours NA Hearing aids removed NA Loose dentures or partials removed NA Prosthetics have been removed NA Patient demonstrates correct use of air break device (if applicable) Patient concerns have been addressed Patient grounding bracelet on and cord attached to chamber Specifics for Inpatients (complete in  addition to above) Medication sheet sent with patient NA Intravenous medications needed or due during therapy sent with patient NA Drainage tubes (e.g. nasogastric tube or chest tube secured and vented) NA Endotracheal or Tracheotomy tube secured NA Cuff deflated of  air and inflated with saline NA Airway suctioned NA Electronic Signature(s) Signed: 01/26/2023 12:26:59 PM By: Demetria Pore Entered By: Demetria Pore on 01/26/2023 10:56:01

## 2023-01-27 NOTE — Progress Notes (Signed)
MARV, BOCKUS A ([id number]) [id number] Page 1 of 2 Visit Report for 01/27/2023 HBO Details Patient Name: Date of Service: Arnold Long, [name] 01/27/2023 7:30 A M Medical Record Number: [id number] Patient Account Number: [id number] Date of Birth/Sex: Treating RN: Mar 05, 1955 (68 y.o. 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, wagoner grade 3 oe Notes: by MRI HBO Treatment Details Treatment Number: 15 Patient Type: Outpatient Chamber Type: Monoplace Chamber Serial #: [id number] Treatment Protocol: 2.0 ATA with 90 minutes oxygen, and no air breaks Treatment Details Compression Rate Down: 2.0 psi / minute De-Compression Rate Up: 1.5 psi / minute Air breaks and breathing Decompress Decompress Compress Tx Pressure Begins Reached periods Begins Ends (leave unused spaces blank) Chamber Pressure (ATA 1 2 ------2 1 ) Clock Time (24 hr) 8:02 8:14 - - - - - - 9:45 9:57 Treatment Length: 115 (minutes) Treatment Segments: 4 Vital Signs Capillary Blood Glucose Reference Range: 80 - 120 mg / dl HBO Diabetic Blood Glucose Intervention Range: <131 mg/dl or >086 mg/dl Time Vitals Blood Respiratory Capillary Blood Glucose Pulse Action Type: Pulse: Temperature: Taken: Pressure: Rate: Glucose (mg/dl): Meter #: Oximetry (%) Taken: Pre 08:00 130/80 70 18 97.8 154 1 99 Post 09:58 128/80 74 18 98 101 1 98 Treatment Response Treatment Toleration: Well Treatment Completion Status: Treatment Completed without Adverse Event Electronic Signature(s) Signed: 01/27/2023 11:37:48 AM By: Demetria Pore Signed: 01/27/2023 6:05:15 PM  By: Allen Derry PA-C Entered By: Demetria Pore on 01/27/2023 11:12:03 [name] ([id number]) [id number] Page 2 of 2 -------------------------------------------------------------------------------- HBO Safety Checklist Details Patient Name: Date of Service: Arnold Long, [name] 01/27/2023 7:30 A M Medical Record Number: [id number] Patient Account Number: [id number] Date of Birth/Sex: Treating RN: [birth year] (68 y.o. M) Primary Care Eero Dini: Sumner Boast Other Clinician: Demetria Pore Referring Glover Capano: Treating Oakley Orban/Extender: Milton Ferguson Weeks in Treatment: 4 HBO Safety Checklist Items Safety Checklist Consent Form Signed Patient voided / foley secured and emptied When did you last eato 01/27/23 Last dose of injectable or oral agent 01/27/23 Ostomy pouch emptied and vented if applicable NA All implantable devices assessed, documented and approved NA Intravenous access site secured and place NA Valuables secured Linens and cotton and cotton/polyester blend (less than 51% polyester) Personal oil-based products / skin lotions / body lotions removed Wigs or hairpieces removed NA Smoking or tobacco materials removed NA Books / newspapers / magazines / loose paper removed Cologne, aftershave, perfume and deodorant removed Jewelry removed (may wrap wedding band) Make-up removed NA Hair care products removed NA Battery operated devices (external) removed NA Heating patches and chemical warmers removed NA Titanium eyewear removed NA Nail polish cured greater than 10 hours NA Casting material cured greater than 10 hours NA Hearing aids removed NA Loose dentures or partials removed NA Prosthetics have been removed NA Patient demonstrates correct use of air break device (if applicable) Patient concerns have been addressed Patient grounding bracelet on and cord attached to chamber Specifics for Inpatients (complete  in addition to above) Medication sheet sent with patient NA Intravenous medications needed or due during therapy sent with patient NA Drainage tubes (e.g. nasogastric tube or chest tube secured and vented) NA Endotracheal or Tracheotomy tube secured NA Cuff deflated  of air and inflated with saline NA Airway suctioned NA Electronic Signature(s) Signed: 01/27/2023 11:37:48 AM By: Demetria Pore Entered By: Demetria Pore on 01/27/2023 11:10:45

## 2023-01-27 NOTE — Progress Notes (Signed)
ESAUL, RENFROE A ([id number]) [id number] Page 1 of 2 Visit Report for 01/26/2023 Arrival Information Details Patient Name: Date of Service: Brian Long, [name] 01/26/2023 7:30 A M Medical Record Number: [id number] Patient Account Number: [id number] Date of Birth/Sex: Treating RN: 05/14/1955 (68 y.o. M) Primary Care [name]: Sumner Boast Other Clinician: Demetria Pore Referring Kerensa Nicklas: Treating Zakhi Dupre/Extender: Renee Rival in Treatment: 4 Visit Information History Since Last Visit Added or deleted any medications: No Patient Arrived: Ambulatory Any new allergies or adverse reactions: No Arrival Time: 07:30 Had a fall or experienced change in No Accompanied By: self activities of daily living that may affect Transfer Assistance: None risk of falls: Patient Identification Verified: Yes Signs or symptoms of abuse/neglect since last visito No Secondary Verification Process Completed: Yes Hospitalized since last visit: No Patient Requires Transmission-Based Precautions: No Implantable device outside of the clinic excluding No Patient Has Alerts: Yes cellular tissue based products placed in the center Patient Alerts: Patient on Blood Thinner since last visit: Diabetic Type 2 Pain Present Now: No Eliquis Electronic Signature(s) Signed: 01/26/2023 12:26:59 PM By: Demetria Pore Entered By: Demetria Pore on 01/26/2023 10:52:27 -------------------------------------------------------------------------------- Encounter Discharge Information Details Patient Name: Date of Service: Brian Long, DO NA LD A. 01/26/2023 7:30 A M Medical Record Number: [id number] Patient Account Number: [id number] Date of Birth/Sex: Treating RN: 27-Dec-1955 (68 y.o. M) Primary Care [name]: Sumner Boast Other Clinician: Demetria Pore Referring Nichole Neyer: Treating [name] in Treatment: 4 Encounter  Discharge Information Items Discharge Condition: Stable Ambulatory Status: Ambulatory Discharge Destination: Home Transportation: Private Auto Accompanied By: self Schedule Follow-up Appointment: Yes Clinical Summary of Care: FRENCH, [name] ([id number]) [phone].pdf Page 2 of 2 Electronic Signature(s) Signed: 01/26/2023 12:26:59 PM By: Demetria Pore Entered By: Demetria Pore on 01/26/2023 11:02:36 -------------------------------------------------------------------------------- Vitals Details Patient Name: Date of Service: Brian Long, DO NA LD A. 01/26/2023 7:30 A M Medical Record Number: [id number] Patient Account Number: [id number] Date of Birth/Sex: Treating RN: 12-01-1955 (68 y.o. M) Primary Care Caliah Kopke: Sumner Boast Other Clinician: Demetria Pore Referring Loie Jahr: Treating [name] in Treatment: 4 Vital Signs Time Taken: 07:30 Temperature (F): 98.0 Height (in): 69 Pulse (bpm): 80 Weight (lbs): 210 Respiratory Rate (breaths/min): 18 Body Mass Index (BMI): 31 Blood Pressure (mmHg): 120/80 Capillary Blood Glucose (mg/dl): 010 Reference Range: 80 - 120 mg / dl Airway Pulse Oximetry (%): 98 Electronic Signature(s) Signed: 01/26/2023 12:26:59 PM By: Demetria Pore Entered By: Demetria Pore on 01/26/2023 10:54:47

## 2023-01-27 NOTE — Progress Notes (Signed)
[name], [name] A ([id number]) [id number] Page 1 of 2 Visit Report for 01/27/2023 Arrival Information Details Patient Name: Date of Service: Brian Long, [name] 01/27/2023 7:30 A M Medical Record Number: [id number] Patient Account Number: [id number] Date of Birth/Sex: Treating RN: 11-25-1955 (68 y.o. M) Primary Care [name]: [name] Other Clinician: Demetria [name] Referring [name]: Treating [name]: 4 Visit Information History Since Last Visit Added or deleted any medications: No Patient Arrived: Ambulatory Any new allergies or adverse reactions: No Arrival Time: 08:02 Had a fall or experienced change in No Accompanied By: self activities of daily living that may affect Transfer Assistance: None risk of falls: Patient Identification Verified: Yes Signs or symptoms of abuse/neglect since last visito No Secondary Verification Process Completed: Yes Hospitalized since last visit: No Patient Requires Transmission-Based Precautions: No Implantable device outside of the clinic excluding No Patient Has Alerts: Yes cellular tissue based products placed in the center Patient Alerts: Patient on Blood Thinner since last visit: Diabetic Type 2 Pain Present Now: No Eliquis Electronic Signature(s) Signed: 01/27/2023 11:37:48 AM By: [name] Entered By: [name] on 01/27/2023 11:10:38 -------------------------------------------------------------------------------- Encounter Discharge Information Details Patient Name: Date of Service: Brian Long, DO NA LD A. 01/27/2023 7:30 A M Medical Record Number: [id number] Patient Account Number: [id number] Date of Birth/Sex: Treating RN: Dec 29, 1955 (68 y.o. M) Primary Care Analy Bassford: [name] Other Clinician: Demetria [name] Referring [name]: Treating [name]: 4 Encounter  Discharge Information Items Discharge Condition: Stable Ambulatory Status: Ambulatory Discharge Destination: Home Transportation: Private Auto Accompanied By: self Schedule Follow-up Appointment: Yes Clinical Summary of Care: Brian, [name] ([id number]) [phone].pdf Page 2 of 2 Electronic Signature(s) Signed: 01/27/2023 11:37:48 AM By: [name] Entered By: [name] on 01/27/2023 11:12:24 -------------------------------------------------------------------------------- Vitals Details Patient Name: Date of Service: Brian Long, DO NA LD A. 01/27/2023 7:30 A M Medical Record Number: [id number] Patient Account Number: [id number] Date of Birth/Sex: Treating RN: [birth year] (68 y.o. M) Primary Care Janilah Hojnacki: [name] Other Clinician: Demetria [name] Referring Jarman Litton: Treating Monte Bronder/Extender: [name] in [name]: 4 Vital Signs Time Taken: 08:00 Temperature (F): 97.8 Height (in): 69 Pulse (bpm): 70 Weight (lbs): 210 Respiratory Rate (breaths/min): 18 Body Mass Index (BMI): 31 Blood Pressure (mmHg): 130/80 Capillary Blood Glucose (mg/dl): 220 Reference Range: 80 - 120 mg / dl Airway Pulse Oximetry (%): 99 Electronic Signature(s) Signed: 01/27/2023 11:37:48 AM By: [name] Entered By: [name] on 01/27/2023 11:10:40

## 2023-01-28 ENCOUNTER — Encounter: Payer: Medicare HMO | Admitting: Physician Assistant

## 2023-01-28 DIAGNOSIS — E11621 Type 2 diabetes mellitus with foot ulcer: Secondary | ICD-10-CM | POA: Diagnosis not present

## 2023-01-28 LAB — GLUCOSE, CAPILLARY
Glucose-Capillary: 190 mg/dL — ABNORMAL HIGH (ref 70–99)
Glucose-Capillary: 114 mg/dL — ABNORMAL HIGH (ref 70–99)

## 2023-01-28 NOTE — Progress Notes (Signed)
URA, SLAIGHT A ([id number]) [id number] Page 1 of 2 Visit Report for 01/28/2023 Arrival Information Details Patient Name: Date of Service: Arnold Long, [name] 01/28/2023 7:30 A M Medical Record Number: [id number] Patient Account Number: [id number] Date of Birth/Sex: Treating RN: 1955-05-05 (68 y.o. M) Primary Care Remedy Corporan: Sumner Boast Other Clinician: Demetria Pore Referring Landrum Carbonell: Treating Bryam Taborda/Extender: Renee Rival in Treatment: 4 Visit Information History Since Last Visit Added or deleted any medications: No Patient Arrived: Ambulatory Any new allergies or adverse reactions: No Arrival Time: 07:58 Had a fall or experienced change in No Accompanied By: 8:09 activities of daily living that may affect Transfer Assistance: None risk of falls: Patient Identification Verified: Yes Signs or symptoms of abuse/neglect since last visito No Secondary Verification Process Completed: Yes Hospitalized since last visit: No Patient Requires Transmission-Based Precautions: No Implantable device outside of the clinic excluding No Patient Has Alerts: Yes cellular tissue based products placed in the center Patient Alerts: Patient on Blood Thinner since last visit: Diabetic Type 2 Pain Present Now: No Eliquis Electronic Signature(s) Signed: 01/28/2023 10:54:00 AM By: Demetria Pore Entered By: Demetria Pore on 01/28/2023 09:14:40 -------------------------------------------------------------------------------- Encounter Discharge Information Details Patient Name: Date of Service: Arnold Long, DO NA LD A. 01/28/2023 7:30 A M Medical Record Number: [id number] Patient Account Number: [id number] Date of Birth/Sex: Treating RN: Jun 07, 1955 (68 y.o. M) Primary Care Lacara Dunsworth: Sumner Boast Other Clinician: Demetria Pore Referring Laylonie Marzec: Treating Lumi Winslett/Extender: Renee Rival in Treatment: 4 Encounter  Discharge Information Items Discharge Condition: Stable Ambulatory Status: Ambulatory Discharge Destination: Home Transportation: Private Auto Accompanied By: self Schedule Follow-up Appointment: Yes Clinical Summary of Care: DECARLOS, SEPP ([id number]) [id number].pdf Page 2 of 2 Electronic Signature(s) Signed: 01/28/2023 10:54:00 AM By: Demetria Pore Entered By: Demetria Pore on 01/28/2023 10:53:44 -------------------------------------------------------------------------------- Vitals Details Patient Name: Date of Service: Arnold Long, DO NA LD A. 01/28/2023 7:30 A M Medical Record Number: [id number] Patient Account Number: [id number] Date of Birth/Sex: Treating RN: [birth year] (68 y.o. M) Primary Care Hazaiah Edgecombe: Sumner Boast Other Clinician: Demetria Pore Referring Cythia Bachtel: Treating Kambrie Eddleman/Extender: Renee Rival in Treatment: 4 Vital Signs Time Taken: 07:30 Temperature (F): 98.0 Height (in): 69 Pulse (bpm): 72 Weight (lbs): 210 Respiratory Rate (breaths/min): 18 Body Mass Index (BMI): 31 Blood Pressure (mmHg): 120/78 Capillary Blood Glucose (mg/dl): 732 Reference Range: 80 - 120 mg / dl Airway Pulse Oximetry (%): 99 Electronic Signature(s) Signed: 01/28/2023 10:54:00 AM By: Demetria Pore Entered By: Demetria Pore on 01/28/2023 09:17:02

## 2023-01-28 NOTE — Progress Notes (Signed)
JERIUS, KOZIK A ([id number]) [id number] Page 1 of 2 Visit Report for 01/28/2023 HBO Details Patient Name: Date of Service: Brian Long, [name] 01/28/2023 7:30 A M Medical Record Number: [id number] Patient Account Number: [id number] Date of Birth/Sex: Treating RN: 08-17-1955 (68 y.o. 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, wagoner grade 3 oe Notes: by MRI HBO Treatment Details Treatment Number: 16 Patient Type: Outpatient Chamber Type: Monoplace Chamber Serial #: [id number] Treatment Protocol: 2.0 ATA with 90 minutes oxygen, and no air breaks Treatment Details Compression Rate Down: 2.0 psi / minute De-Compression Rate Up: 1.5 psi / minute Air breaks and breathing Decompress Decompress Compress Tx Pressure Begins Reached periods Begins Ends (leave unused spaces blank) Chamber Pressure (ATA 1 2 ------2 1 ) Clock Time (24 hr) 7:58 8:09 - - - - - - 9:40 9:51 Treatment Length: 113 (minutes) Treatment Segments: 4 Vital Signs Capillary Blood Glucose Reference Range: 80 - 120 mg / dl HBO Diabetic Blood Glucose Intervention Range: <131 mg/dl or >469 mg/dl Time Vitals Blood Respiratory Capillary Blood Glucose Pulse Action Type: Pulse: Temperature: Taken: Pressure: Rate: Glucose (mg/dl): Meter #: Oximetry (%) Taken: Pre 07:30 120/78 72 18 98 190 99 Post 09:52 118/80 70 18 97.8 114 1 98 Treatment Response Treatment Toleration: Well Treatment Completion Status: Treatment Completed without Adverse Event Electronic Signature(s) Signed: 01/28/2023 10:54:00 AM By: Demetria Pore Signed: 01/28/2023 12:54:04 PM  By: Allen Derry PA-C Entered By: Demetria Pore on 01/28/2023 10:53:18 [name] ([id number]) [id number] Page 2 of 2 -------------------------------------------------------------------------------- HBO Safety Checklist Details Patient Name: Date of Service: Brian Long, [name] 01/28/2023 7:30 A M Medical Record Number: [id number] Patient Account Number: [id number] Date of Birth/Sex: Treating RN: Feb 25, 1955 (68 y.o. M) Primary Care Helder Crisafulli: Sumner Boast Other Clinician: Demetria Pore Referring Rathana Viveros: Treating Michalina Calbert/Extender: Milton Ferguson Weeks in Treatment: 4 HBO Safety Checklist Items Safety Checklist Consent Form Signed Patient voided / foley secured and emptied When did you last eato 01/28/23 Last dose of injectable or oral agent 01/28/23 Ostomy pouch emptied and vented if applicable NA All implantable devices assessed, documented and approved NA Intravenous access site secured and place NA Valuables secured Linens and cotton and cotton/polyester blend (less than 51% polyester) Personal oil-based products / skin lotions / body lotions removed Wigs or hairpieces removed NA Smoking or tobacco materials removed NA Books / newspapers / magazines / loose paper removed Cologne, aftershave, perfume and deodorant removed Jewelry removed (may wrap wedding band) Make-up removed NA Hair care products removed Battery operated devices (external) removed NA Heating patches and chemical warmers removed NA Titanium eyewear removed NA Nail polish cured greater than 10 hours NA Casting material cured greater than 10 hours NA Hearing aids removed NA Loose dentures or partials removed NA Prosthetics have been removed NA Patient demonstrates correct use of air break device (if applicable) Patient concerns have been addressed Patient grounding bracelet on and cord attached to chamber Specifics for Inpatients (complete in  addition to above) Medication sheet sent with patient NA Intravenous medications needed or due during therapy sent with patient NA Drainage tubes (e.g. nasogastric tube or chest tube secured and vented) NA Endotracheal or Tracheotomy tube secured NA Cuff deflated of air  and inflated with saline NA Airway suctioned NA Electronic Signature(s) Signed: 01/28/2023 10:54:00 AM By: Demetria Pore Entered By: Demetria Pore on 01/28/2023 10:51:56

## 2023-01-31 ENCOUNTER — Encounter: Payer: Medicare HMO | Admitting: Physician Assistant

## 2023-01-31 DIAGNOSIS — E11621 Type 2 diabetes mellitus with foot ulcer: Secondary | ICD-10-CM | POA: Diagnosis not present

## 2023-01-31 LAB — GLUCOSE, CAPILLARY
Glucose-Capillary: 195 mg/dL — ABNORMAL HIGH (ref 70–99)
Glucose-Capillary: 143 mg/dL — ABNORMAL HIGH (ref 70–99)

## 2023-01-31 NOTE — Progress Notes (Signed)
DACORION, ASMAR A ([id number]) [id number] Page 1 of 2 Visit Report for 01/31/2023 Arrival Information Details Patient Name: Date of Service: Brian Long, [name] 01/31/2023 7:30 A M Medical Record Number: [id number] Patient Account Number: [id number] Date of Birth/Sex: Treating RN: 1955/01/19 (68 y.o. M) Primary Care Adi Seales: Sumner Boast Other Clinician: Referring Mirna Sutcliffe: Treating Ki Luckman/Extender: Renee Rival in Treatment: 5 Visit Information History Since Last Visit Added or deleted any medications: No Patient Arrived: Ambulatory Any new allergies or adverse reactions: No Arrival Time: 07:30 Had a fall or experienced change in No Accompanied By: self activities of daily living that may affect Transfer Assistance: None risk of falls: Patient Identification Verified: Yes Signs or symptoms of abuse/neglect since last visito No Secondary Verification Process Completed: Yes Hospitalized since last visit: No Patient Requires Transmission-Based Precautions: No Implantable device outside of the clinic excluding No Patient Has Alerts: Yes cellular tissue based products placed in the center Patient Alerts: Patient on Blood Thinner since last visit: Diabetic Type 2 Pain Present Now: No Eliquis Electronic Signature(s) Signed: 01/31/2023 1:02:01 PM By: Demetria Pore Entered By: Demetria Pore on 01/31/2023 09:15:55 -------------------------------------------------------------------------------- Encounter Discharge Information Details Patient Name: Date of Service: Brian Long, DO NA LD A. 01/31/2023 7:30 A M Medical Record Number: [id number] Patient Account Number: [id number] Date of Birth/Sex: Treating RN: 02-Dec-1955 (68 y.o. M) Primary Care Alvino Lechuga: Sumner Boast Other Clinician: Referring Rubee Vega: Treating Marvelyn Bouchillon/Extender: Renee Rival in Treatment: 5 Encounter Discharge Information  Items Discharge Condition: Stable Ambulatory Status: Ambulatory Discharge Destination: Home Transportation: Private Auto Accompanied By: self Schedule Follow-up Appointment: Yes Clinical Summary of Care: [name], [name] ([id number]) [phone].pdf Page 2 of 2 Electronic Signature(s) Signed: 01/31/2023 1:02:01 PM By: Demetria Pore Entered By: Demetria Pore on 01/31/2023 11:18:57 -------------------------------------------------------------------------------- Vitals Details Patient Name: Date of Service: Brian Long, DO NA LD A. 01/31/2023 7:30 A M Medical Record Number: [id number] Patient Account Number: [id number] Date of Birth/Sex: Treating RN: [birth year] (68 y.o. M) Primary Care Taura Lamarre: Sumner Boast Other Clinician: Referring Read Bonelli: Treating Marah Park/Extender: Milton Ferguson Weeks in Treatment: 5 Vital Signs Time Taken: 07:30 Temperature (F): 98.0 Height (in): 69 Pulse (bpm): 66 Weight (lbs): 210 Respiratory Rate (breaths/min): 18 Body Mass Index (BMI): 31 Blood Pressure (mmHg): 110/78 Capillary Blood Glucose (mg/dl): 831 Reference Range: 80 - 120 mg / dl Airway Pulse Oximetry (%): 99 Electronic Signature(s) Signed: 01/31/2023 1:02:01 PM By: Demetria Pore Entered By: Demetria Pore on 01/31/2023 09:16:15

## 2023-01-31 NOTE — Progress Notes (Signed)
KEELAN, TORREZ A ([id number]) [id number] Page 1 of 2 Visit Report for 01/31/2023 HBO Details Patient Name: Date of Service: Arnold Long, [name] 01/31/2023 7:30 A M Medical Record Number: [id number] Patient Account Number: [id number] Date of Birth/Sex: Treating RN: August 09, 1955 (67 y.o. 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, wagoner grade 3 oe Notes: by MRI HBO Treatment Details Treatment Number: 17 Patient Type: Outpatient Chamber Type: Monoplace Chamber Serial #: [id number] Treatment Protocol: 2.0 ATA with 90 minutes oxygen, and no air breaks Treatment Details Compression Rate Down: 2.0 psi / minute De-Compression Rate Up: 1.5 psi / minute Air breaks and breathing Decompress Decompress Compress Tx Pressure Begins Reached periods Begins Ends (leave unused spaces blank) Chamber Pressure (ATA 1 2 ------2 1 ) Clock Time (24 hr) 8:12 8:24 - - - - - - 9:54 10:05 Treatment Length: 113 (minutes) Treatment Segments: 4 Vital Signs Capillary Blood Glucose Reference Range: 80 - 120 mg / dl HBO Diabetic Blood Glucose Intervention Range: <131 mg/dl or >267 mg/dl Time Vitals Blood Respiratory Capillary Blood Glucose Pulse Action Type: Pulse: Temperature: Taken: Pressure: Rate: Glucose (mg/dl): Meter #: Oximetry (%) Taken: Pre 07:30 110/78 66 18 98 195 1 99 Post 10:06 122/80 65 18 98.1 143 1 99 Treatment Response Treatment Toleration: Well Treatment Completion Status: Treatment Completed without Adverse Event Electronic Signature(s) Signed: 01/31/2023 1:02:01 PM By: Demetria Pore Signed: 01/31/2023 5:18:10 PM By: Allen Derry PA-C Entered By: Demetria Pore on 01/31/2023 11:18:17 Dorin, Abhimanyu A ([id number]) [id number] Page 2 of 2 -------------------------------------------------------------------------------- HBO Safety Checklist Details Patient Name: Date of Service: Arnold Long, [name] 01/31/2023 7:30 A M Medical Record Number: [id number] Patient Account Number: [id number] Date of Birth/Sex: Treating RN: 12/19/1955 (68 y.o. M) Primary Care Peja Allender: Sumner Boast Other Clinician: Referring Marvette Schamp: Treating Takyla Kuchera/Extender: Milton Ferguson Weeks in Treatment: 5 HBO Safety Checklist Items Safety Checklist Consent Form Signed Patient voided / foley secured and emptied When did you last eato 02/05/23 Last dose of injectable or oral agent 02/05/23 Ostomy pouch emptied and vented if applicable NA All implantable devices assessed, documented and approved NA Intravenous access site secured and place NA Valuables secured Linens and cotton and cotton/polyester blend (less than 51% polyester) Personal oil-based products / skin lotions / body lotions removed Wigs or hairpieces removed NA Smoking or tobacco materials removed NA Books / newspapers / magazines / loose paper removed Cologne, aftershave, perfume and deodorant removed Jewelry removed (may wrap wedding band) Make-up removed NA Hair care products removed Battery operated devices (external) removed Heating patches and chemical warmers removed NA Titanium eyewear removed NA Nail polish cured greater than 10 hours NA Casting material cured greater than 10 hours NA Hearing aids removed NA Loose dentures or partials removed NA Prosthetics have been removed NA Patient demonstrates correct use of air break device (if applicable) Patient concerns have been addressed Patient grounding bracelet on and cord attached to chamber Specifics for Inpatients (complete in addition to  above) Medication sheet sent with patient NA Intravenous medications needed or due during therapy sent with patient NA Drainage tubes (e.g. nasogastric tube or chest tube secured and vented) NA Endotracheal or Tracheotomy tube secured NA Cuff deflated of air and inflated with saline  NA Airway suctioned NA Electronic Signature(s) Signed: 01/31/2023 1:02:01 PM By: Demetria Pore Entered By: Demetria Pore on 01/31/2023 09:17:25

## 2023-02-01 ENCOUNTER — Encounter: Payer: Medicare HMO | Admitting: Physician Assistant

## 2023-02-01 DIAGNOSIS — E11621 Type 2 diabetes mellitus with foot ulcer: Secondary | ICD-10-CM | POA: Diagnosis not present

## 2023-02-01 LAB — GLUCOSE, CAPILLARY
Glucose-Capillary: 218 mg/dL — ABNORMAL HIGH (ref 70–99)
Glucose-Capillary: 166 mg/dL — ABNORMAL HIGH (ref 70–99)

## 2023-02-01 NOTE — Progress Notes (Signed)
YEKUSIEL, VANOTTERLOO A ([id number]) [phone].pdf Page 1 of 2 Visit Report for 02/01/2023 Arrival Information Details Patient Name: Date of Service: Brian Long, [name] 02/01/2023 7:30 A M Medical Record Number: [id number] Patient Account Number: [id number] Date of Birth/Sex: Treating RN: [birth year] (68 y.o. M) Primary Care Brendy Ficek: Sumner Boast Other Clinician: Referring Aj Crunkleton: Treating Capri Raben/Extender: Renee Rival in Treatment: 5 Visit Information History Since Last Visit Added or deleted any medications: No Patient Arrived: Ambulatory Any new allergies or adverse reactions: No Arrival Time: 07:30 Had a fall or experienced change in No Accompanied By: self activities of daily living that may affect Transfer Assistance: None risk of falls: Patient Identification Verified: Yes Signs or symptoms of abuse/neglect since last visito No Secondary Verification Process Completed: Yes Hospitalized since last visit: No Patient Requires Transmission-Based Precautions: No Implantable device outside of the clinic excluding No Patient Has Alerts: Yes cellular tissue based products placed in the center Patient Alerts: Patient on Blood Thinner since last visit: Diabetic Type 2 Pain Present Now: No Eliquis Electronic Signature(s) Signed: 02/01/2023 12:19:03 PM By: Demetria Pore Entered By: Demetria Pore on 02/01/2023 10:29:44 -------------------------------------------------------------------------------- Encounter Discharge Information Details Patient Name: Date of Service: Brian Long, DO [place] LD A. 02/01/2023 7:30 A M Medical Record Number: [id number] Patient Account Number: [id number] Date of Birth/Sex: Treating RN: Jun 09, 1955 (68 y.o. M) Primary Care Caretha Rumbaugh: Sumner Boast Other Clinician: Referring Khristen Cheyney: Treating Antionne Enrique/Extender: Renee Rival in Treatment: 5 Encounter Discharge Information  Items Discharge Condition: Stable Ambulatory Status: Ambulatory Discharge Destination: Home Transportation: Private Auto Accompanied By: self Schedule Follow-up Appointment: Yes Clinical Summary of Care: JAEVON, GRECH ([id number]) [phone].pdf Page 2 of 2 Electronic Signature(s) Signed: 02/01/2023 12:19:03 PM By: Demetria Pore Entered By: Demetria Pore on 02/01/2023 10:42:50 -------------------------------------------------------------------------------- Vitals Details Patient Name: Date of Service: Brian Long, DO NA LD A. 02/01/2023 7:30 A M Medical Record Number: [id number] Patient Account Number: [id number] Date of Birth/Sex: Treating RN: [birth year] (68 y.o. M) Primary Care Searcy Miyoshi: Sumner Boast Other Clinician: Referring Kenyana Husak: Treating Zarek Relph/Extender: Milton Ferguson Weeks in Treatment: 5 Vital Signs Time Taken: 07:30 Temperature (F): 98.0 Height (in): 69 Pulse (bpm): 68 Weight (lbs): 210 Respiratory Rate (breaths/min): 18 Body Mass Index (BMI): 31 Blood Pressure (mmHg): 138/80 Capillary Blood Glucose (mg/dl): 710 Reference Range: 80 - 120 mg / dl Airway Pulse Oximetry (%): 99 Electronic Signature(s) Signed: 02/01/2023 12:19:03 PM By: Demetria Pore Entered By: Demetria Pore on 02/01/2023 10:30:08

## 2023-02-01 NOTE — Progress Notes (Signed)
KALEP, SUMMERTON A ([id number]) [id number] Page 1 of 2 Visit Report for 02/01/2023 HBO Details Patient Name: Date of Service: Brian Long, [name] 02/01/2023 7:30 A M Medical Record Number: [id number] Patient Account Number: [id number] Date of Birth/Sex: Treating RN: December 18, 1955 (68 y.o. 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, wagoner grade 3 oe Notes: by MRI HBO Treatment Details Treatment Number: 18 Patient Type: Outpatient Chamber Type: Monoplace Chamber Serial #: [id number] Treatment Protocol: 2.0 ATA with 90 minutes oxygen, and no air breaks Treatment Details Compression Rate Down: 2.0 psi / minute De-Compression Rate Up: 1.5 psi / minute Air breaks and breathing Decompress Decompress Compress Tx Pressure Begins Reached periods Begins Ends (leave unused spaces blank) Chamber Pressure (ATA 1 2 ------2 1 ) Clock Time (24 hr) 8:07 8:17 - - - - - - 9:47 9:57 Treatment Length: 110 (minutes) Treatment Segments: 4 Vital Signs Capillary Blood Glucose Reference Range: 80 - 120 mg / dl HBO Diabetic Blood Glucose Intervention Range: <131 mg/dl or >664 mg/dl Time Vitals Blood Respiratory Capillary Blood Glucose Pulse Action Type: Pulse: Temperature: Taken: Pressure: Rate: Glucose (mg/dl): Meter #: Oximetry (%) Taken: Pre 07:30 138/80 68 18 98 218 1 99 Post 09:57 124/80 68 18 98 166 1 98 Treatment Response Treatment Toleration: Well Treatment Completion Status: Treatment Completed without Adverse Event Electronic Signature(s) Signed: 02/01/2023 12:19:03 PM By: Demetria Pore Signed: 02/01/2023 6:09:39 PM By: Allen Derry PA-C Entered By: Demetria Pore on 02/01/2023 10:42:19 Avants, Jameel A ([id number]) [id number] Page 2 of 2 -------------------------------------------------------------------------------- HBO Safety Checklist Details Patient Name: Date of Service: Brian Long, [name] 02/01/2023 7:30 A M Medical Record Number: [id number] Patient Account Number: [id number] Date of Birth/Sex: Treating RN: October 24, 1955 (68 y.o. M) Primary Care Jetaime Pinnix: Sumner Boast Other Clinician: Referring Victoriana Aziz: Treating Niyanna Asch/Extender: Milton Ferguson Weeks in Treatment: 5 HBO Safety Checklist Items Safety Checklist Consent Form Signed Patient voided / foley secured and emptied When did you last eato 02/01/23 Last dose of injectable or oral agent 02/01/23 Ostomy pouch emptied and vented if applicable NA All implantable devices assessed, documented and approved NA Intravenous access site secured and place NA Valuables secured Linens and cotton and cotton/polyester blend (less than 51% polyester) Personal oil-based products / skin lotions / body lotions removed Wigs or hairpieces removed NA Smoking or tobacco materials removed NA Books / newspapers / magazines / loose paper removed Cologne, aftershave, perfume and deodorant removed Jewelry removed (may wrap wedding band) Make-up removed NA Hair care products removed Battery operated devices (external) removed NA Heating patches and chemical warmers removed NA Titanium eyewear removed NA Nail polish cured greater than 10 hours NA Casting material cured greater than 10 hours NA Hearing aids removed NA Loose dentures or partials removed NA Prosthetics have been removed NA Patient demonstrates correct use of air break device (if applicable) Patient concerns have been addressed Patient grounding bracelet on and cord attached to chamber Specifics for Inpatients (complete in addition to  above) Medication sheet sent with patient NA Intravenous medications needed or due during therapy sent with patient NA Drainage tubes (e.g. nasogastric tube or chest tube secured and vented) NA Endotracheal or Tracheotomy tube secured NA Cuff deflated of air and inflated with  saline NA Airway suctioned NA Electronic Signature(s) Signed: 02/01/2023 12:19:03 PM By: Demetria Pore Entered By: Demetria Pore on 02/01/2023 10:30:59

## 2023-02-02 ENCOUNTER — Encounter: Payer: Medicare HMO | Admitting: Physician Assistant

## 2023-02-03 ENCOUNTER — Encounter: Payer: Medicare HMO | Admitting: Physician Assistant

## 2023-02-03 DIAGNOSIS — E11621 Type 2 diabetes mellitus with foot ulcer: Secondary | ICD-10-CM | POA: Diagnosis not present

## 2023-02-03 LAB — GLUCOSE, CAPILLARY
Glucose-Capillary: 304 mg/dL — ABNORMAL HIGH (ref 70–99)
Glucose-Capillary: 180 mg/dL — ABNORMAL HIGH (ref 70–99)

## 2023-02-04 ENCOUNTER — Encounter: Payer: Medicare HMO | Admitting: Physician Assistant

## 2023-02-04 DIAGNOSIS — E11621 Type 2 diabetes mellitus with foot ulcer: Secondary | ICD-10-CM | POA: Diagnosis not present

## 2023-02-04 LAB — GLUCOSE, CAPILLARY
Glucose-Capillary: 249 mg/dL — ABNORMAL HIGH (ref 70–99)
Glucose-Capillary: 182 mg/dL — ABNORMAL HIGH (ref 70–99)

## 2023-02-07 ENCOUNTER — Encounter: Payer: Medicare HMO | Admitting: Physician Assistant

## 2023-02-07 DIAGNOSIS — E11621 Type 2 diabetes mellitus with foot ulcer: Secondary | ICD-10-CM | POA: Diagnosis not present

## 2023-02-07 LAB — GLUCOSE, CAPILLARY
Glucose-Capillary: 256 mg/dL — ABNORMAL HIGH (ref 70–99)
Glucose-Capillary: 165 mg/dL — ABNORMAL HIGH (ref 70–99)

## 2023-02-08 ENCOUNTER — Encounter: Payer: Medicare HMO | Admitting: Physician Assistant

## 2023-02-08 ENCOUNTER — Ambulatory Visit (HOSPITAL_COMMUNITY): Payer: Medicare HMO

## 2023-02-08 ENCOUNTER — Ambulatory Visit: Payer: Medicare (Managed Care)

## 2023-02-08 DIAGNOSIS — I442 Atrioventricular block, complete: Secondary | ICD-10-CM

## 2023-02-08 DIAGNOSIS — E11621 Type 2 diabetes mellitus with foot ulcer: Secondary | ICD-10-CM | POA: Diagnosis not present

## 2023-02-08 LAB — GLUCOSE, CAPILLARY
Glucose-Capillary: 236 mg/dL — ABNORMAL HIGH (ref 70–99)
Glucose-Capillary: 158 mg/dL — ABNORMAL HIGH (ref 70–99)

## 2023-02-09 ENCOUNTER — Encounter: Payer: Medicare HMO | Admitting: Physician Assistant

## 2023-02-09 DIAGNOSIS — E11621 Type 2 diabetes mellitus with foot ulcer: Secondary | ICD-10-CM | POA: Diagnosis not present

## 2023-02-09 LAB — GLUCOSE, CAPILLARY
Glucose-Capillary: 274 mg/dL — ABNORMAL HIGH (ref 70–99)
Glucose-Capillary: 159 mg/dL — ABNORMAL HIGH (ref 70–99)

## 2023-02-10 ENCOUNTER — Encounter: Payer: Medicare HMO | Admitting: Physician Assistant

## 2023-02-10 DIAGNOSIS — E11621 Type 2 diabetes mellitus with foot ulcer: Secondary | ICD-10-CM | POA: Diagnosis not present

## 2023-02-10 LAB — GLUCOSE, CAPILLARY
Glucose-Capillary: 301 mg/dL — ABNORMAL HIGH (ref 70–99)
Glucose-Capillary: 220 mg/dL — ABNORMAL HIGH (ref 70–99)

## 2023-02-11 ENCOUNTER — Encounter: Payer: Medicare HMO | Admitting: Physician Assistant

## 2023-02-11 DIAGNOSIS — E11621 Type 2 diabetes mellitus with foot ulcer: Secondary | ICD-10-CM | POA: Diagnosis not present

## 2023-02-11 LAB — GLUCOSE, CAPILLARY
Glucose-Capillary: 268 mg/dL — ABNORMAL HIGH (ref 70–99)
Glucose-Capillary: 222 mg/dL — ABNORMAL HIGH (ref 70–99)

## 2023-02-14 ENCOUNTER — Encounter: Payer: Medicare HMO | Attending: Physician Assistant | Admitting: Physician Assistant

## 2023-02-14 DIAGNOSIS — E11622 Type 2 diabetes mellitus with other skin ulcer: Secondary | ICD-10-CM | POA: Insufficient documentation

## 2023-02-14 DIAGNOSIS — L97512 Non-pressure chronic ulcer of other part of right foot with fat layer exposed: Secondary | ICD-10-CM | POA: Insufficient documentation

## 2023-02-14 DIAGNOSIS — Z7901 Long term (current) use of anticoagulants: Secondary | ICD-10-CM | POA: Diagnosis not present

## 2023-02-14 DIAGNOSIS — E1151 Type 2 diabetes mellitus with diabetic peripheral angiopathy without gangrene: Secondary | ICD-10-CM | POA: Insufficient documentation

## 2023-02-14 DIAGNOSIS — Z950 Presence of cardiac pacemaker: Secondary | ICD-10-CM | POA: Insufficient documentation

## 2023-02-14 DIAGNOSIS — I700 Atherosclerosis of aorta: Secondary | ICD-10-CM | POA: Diagnosis not present

## 2023-02-14 DIAGNOSIS — I480 Paroxysmal atrial fibrillation: Secondary | ICD-10-CM | POA: Insufficient documentation

## 2023-02-14 DIAGNOSIS — I119 Hypertensive heart disease without heart failure: Secondary | ICD-10-CM | POA: Diagnosis not present

## 2023-02-14 DIAGNOSIS — E11621 Type 2 diabetes mellitus with foot ulcer: Secondary | ICD-10-CM | POA: Diagnosis present

## 2023-02-14 LAB — GLUCOSE, CAPILLARY
Glucose-Capillary: 289 mg/dL — ABNORMAL HIGH (ref 70–99)
Glucose-Capillary: 189 mg/dL — ABNORMAL HIGH (ref 70–99)

## 2023-02-15 ENCOUNTER — Encounter: Payer: Medicare HMO | Admitting: Physician Assistant

## 2023-02-15 DIAGNOSIS — E11621 Type 2 diabetes mellitus with foot ulcer: Secondary | ICD-10-CM | POA: Diagnosis not present

## 2023-02-15 LAB — GLUCOSE, CAPILLARY
Glucose-Capillary: 314 mg/dL — ABNORMAL HIGH (ref 70–99)
Glucose-Capillary: 220 mg/dL — ABNORMAL HIGH (ref 70–99)

## 2023-02-16 ENCOUNTER — Encounter: Payer: Medicare HMO | Admitting: Physician Assistant

## 2023-02-16 DIAGNOSIS — E11621 Type 2 diabetes mellitus with foot ulcer: Secondary | ICD-10-CM | POA: Diagnosis not present

## 2023-02-16 LAB — GLUCOSE, CAPILLARY
Glucose-Capillary: 316 mg/dL — ABNORMAL HIGH (ref 70–99)
Glucose-Capillary: 186 mg/dL — ABNORMAL HIGH (ref 70–99)

## 2023-02-17 ENCOUNTER — Encounter: Payer: Medicare HMO | Admitting: Physician Assistant

## 2023-02-17 DIAGNOSIS — E11621 Type 2 diabetes mellitus with foot ulcer: Secondary | ICD-10-CM | POA: Diagnosis not present

## 2023-02-17 LAB — GLUCOSE, CAPILLARY
Glucose-Capillary: 228 mg/dL — ABNORMAL HIGH (ref 70–99)
Glucose-Capillary: 220 mg/dL — ABNORMAL HIGH (ref 70–99)

## 2023-02-18 ENCOUNTER — Encounter: Payer: Medicare HMO | Admitting: Physician Assistant

## 2023-02-18 DIAGNOSIS — E11621 Type 2 diabetes mellitus with foot ulcer: Secondary | ICD-10-CM | POA: Diagnosis not present

## 2023-02-18 LAB — GLUCOSE, CAPILLARY
Glucose-Capillary: 183 mg/dL — ABNORMAL HIGH (ref 70–99)
Glucose-Capillary: 149 mg/dL — ABNORMAL HIGH (ref 70–99)

## 2023-02-21 ENCOUNTER — Encounter: Payer: Medicare HMO | Admitting: Physician Assistant

## 2023-02-21 DIAGNOSIS — E11621 Type 2 diabetes mellitus with foot ulcer: Secondary | ICD-10-CM | POA: Diagnosis not present

## 2023-02-21 LAB — GLUCOSE, CAPILLARY
Glucose-Capillary: 193 mg/dL — ABNORMAL HIGH (ref 70–99)
Glucose-Capillary: 176 mg/dL — ABNORMAL HIGH (ref 70–99)

## 2023-02-22 ENCOUNTER — Encounter: Payer: Medicare HMO | Admitting: Physician Assistant

## 2023-02-22 DIAGNOSIS — E11621 Type 2 diabetes mellitus with foot ulcer: Secondary | ICD-10-CM | POA: Diagnosis not present

## 2023-02-22 LAB — GLUCOSE, CAPILLARY
Glucose-Capillary: 311 mg/dL — ABNORMAL HIGH (ref 70–99)
Glucose-Capillary: 225 mg/dL — ABNORMAL HIGH (ref 70–99)

## 2023-02-23 ENCOUNTER — Encounter: Payer: Medicare HMO | Admitting: Physician Assistant

## 2023-02-23 DIAGNOSIS — E11621 Type 2 diabetes mellitus with foot ulcer: Secondary | ICD-10-CM | POA: Diagnosis not present

## 2023-02-23 LAB — GLUCOSE, CAPILLARY
Glucose-Capillary: 256 mg/dL — ABNORMAL HIGH (ref 70–99)
Glucose-Capillary: 221 mg/dL — ABNORMAL HIGH (ref 70–99)

## 2023-02-24 ENCOUNTER — Encounter: Payer: Medicare HMO | Admitting: Physician Assistant

## 2023-02-24 DIAGNOSIS — E11621 Type 2 diabetes mellitus with foot ulcer: Secondary | ICD-10-CM | POA: Diagnosis not present

## 2023-02-24 LAB — GLUCOSE, CAPILLARY
Glucose-Capillary: 229 mg/dL — ABNORMAL HIGH (ref 70–99)
Glucose-Capillary: 220 mg/dL — ABNORMAL HIGH (ref 70–99)

## 2023-02-25 ENCOUNTER — Encounter: Payer: Medicare HMO | Admitting: Physician Assistant

## 2023-02-25 DIAGNOSIS — E11621 Type 2 diabetes mellitus with foot ulcer: Secondary | ICD-10-CM | POA: Diagnosis not present

## 2023-02-25 LAB — GLUCOSE, CAPILLARY
Glucose-Capillary: 223 mg/dL — ABNORMAL HIGH (ref 70–99)
Glucose-Capillary: 200 mg/dL — ABNORMAL HIGH (ref 70–99)

## 2023-02-28 ENCOUNTER — Encounter: Payer: Medicare HMO | Admitting: Physician Assistant

## 2023-02-28 DIAGNOSIS — E11621 Type 2 diabetes mellitus with foot ulcer: Secondary | ICD-10-CM | POA: Diagnosis not present

## 2023-02-28 LAB — GLUCOSE, CAPILLARY
Glucose-Capillary: 229 mg/dL — ABNORMAL HIGH (ref 70–99)
Glucose-Capillary: 168 mg/dL — ABNORMAL HIGH (ref 70–99)

## 2023-03-01 ENCOUNTER — Encounter: Payer: Medicare HMO | Admitting: Physician Assistant

## 2023-03-01 DIAGNOSIS — E11621 Type 2 diabetes mellitus with foot ulcer: Secondary | ICD-10-CM | POA: Diagnosis not present

## 2023-03-01 LAB — GLUCOSE, CAPILLARY
Glucose-Capillary: 223 mg/dL — ABNORMAL HIGH (ref 70–99)
Glucose-Capillary: 183 mg/dL — ABNORMAL HIGH (ref 70–99)

## 2023-03-02 ENCOUNTER — Encounter: Payer: Medicare HMO | Admitting: Physician Assistant

## 2023-03-02 DIAGNOSIS — E11621 Type 2 diabetes mellitus with foot ulcer: Secondary | ICD-10-CM | POA: Diagnosis not present

## 2023-03-02 LAB — GLUCOSE, CAPILLARY
Glucose-Capillary: 225 mg/dL — ABNORMAL HIGH (ref 70–99)
Glucose-Capillary: 164 mg/dL — ABNORMAL HIGH (ref 70–99)

## 2023-03-03 ENCOUNTER — Encounter: Payer: Medicare HMO | Admitting: Physician Assistant

## 2023-03-03 DIAGNOSIS — E11621 Type 2 diabetes mellitus with foot ulcer: Secondary | ICD-10-CM | POA: Diagnosis not present

## 2023-03-04 ENCOUNTER — Encounter: Payer: Medicare HMO | Admitting: Physician Assistant

## 2023-03-07 ENCOUNTER — Encounter: Payer: Medicare HMO | Admitting: Physician Assistant

## 2023-03-07 DIAGNOSIS — E11621 Type 2 diabetes mellitus with foot ulcer: Secondary | ICD-10-CM | POA: Diagnosis not present

## 2023-03-07 LAB — GLUCOSE, CAPILLARY
Glucose-Capillary: 233 mg/dL — ABNORMAL HIGH (ref 70–99)
Glucose-Capillary: 146 mg/dL — ABNORMAL HIGH (ref 70–99)

## 2023-03-08 ENCOUNTER — Encounter: Payer: Medicare HMO | Admitting: Physician Assistant

## 2023-03-08 DIAGNOSIS — E11621 Type 2 diabetes mellitus with foot ulcer: Secondary | ICD-10-CM | POA: Diagnosis not present

## 2023-03-08 LAB — GLUCOSE, CAPILLARY
Glucose-Capillary: 259 mg/dL — ABNORMAL HIGH (ref 70–99)
Glucose-Capillary: 169 mg/dL — ABNORMAL HIGH (ref 70–99)

## 2023-03-15 ENCOUNTER — Encounter: Attending: Physician Assistant | Admitting: Physician Assistant

## 2023-03-15 DIAGNOSIS — Z7901 Long term (current) use of anticoagulants: Secondary | ICD-10-CM | POA: Insufficient documentation

## 2023-03-15 DIAGNOSIS — Z09 Encounter for follow-up examination after completed treatment for conditions other than malignant neoplasm: Secondary | ICD-10-CM | POA: Insufficient documentation

## 2023-03-15 DIAGNOSIS — Z950 Presence of cardiac pacemaker: Secondary | ICD-10-CM | POA: Diagnosis not present

## 2023-03-15 DIAGNOSIS — I480 Paroxysmal atrial fibrillation: Secondary | ICD-10-CM | POA: Insufficient documentation

## 2023-03-15 DIAGNOSIS — E1169 Type 2 diabetes mellitus with other specified complication: Secondary | ICD-10-CM | POA: Insufficient documentation

## 2023-03-15 DIAGNOSIS — E11621 Type 2 diabetes mellitus with foot ulcer: Secondary | ICD-10-CM | POA: Diagnosis present

## 2023-03-15 DIAGNOSIS — M86471 Chronic osteomyelitis with draining sinus, right ankle and foot: Secondary | ICD-10-CM | POA: Insufficient documentation

## 2023-03-15 DIAGNOSIS — I10 Essential (primary) hypertension: Secondary | ICD-10-CM | POA: Diagnosis not present

## 2023-03-15 DIAGNOSIS — L97512 Non-pressure chronic ulcer of other part of right foot with fat layer exposed: Secondary | ICD-10-CM | POA: Insufficient documentation

## 2023-03-21 LAB — CUP PACEART REMOTE DEVICE CHECK
Battery Voltage: 85
Date Time Interrogation Session: 20250128082302
Implantable Lead Connection Status: 753985
Implantable Lead Connection Status: 753985
Implantable Lead Implant Date: 20230130
Implantable Lead Implant Date: 20230130
Implantable Lead Location: 753859
Implantable Lead Location: 753860
Implantable Lead Model: 377171
Implantable Lead Model: 377171
Implantable Lead Serial Number: 7000391305
Implantable Lead Serial Number: 8000666643
Implantable Pulse Generator Implant Date: 20230130
Pulse Gen Model: 407145
Pulse Gen Serial Number: 70278108

## 2023-03-21 NOTE — Progress Notes (Signed)
 Remote pacemaker transmission.

## 2023-03-28 ENCOUNTER — Encounter: Payer: Self-pay | Admitting: Cardiology

## 2023-03-28 ENCOUNTER — Ambulatory Visit: Payer: Medicare HMO | Attending: Cardiology | Admitting: Cardiology

## 2023-03-28 VITALS — BP 151/79 | HR 77 | Ht 70.0 in | Wt 214.8 lb

## 2023-03-28 DIAGNOSIS — I480 Paroxysmal atrial fibrillation: Secondary | ICD-10-CM | POA: Diagnosis not present

## 2023-03-28 DIAGNOSIS — I10 Essential (primary) hypertension: Secondary | ICD-10-CM | POA: Diagnosis not present

## 2023-03-28 DIAGNOSIS — I442 Atrioventricular block, complete: Secondary | ICD-10-CM | POA: Diagnosis not present

## 2023-03-28 DIAGNOSIS — Z01810 Encounter for preprocedural cardiovascular examination: Secondary | ICD-10-CM

## 2023-03-28 MED ORDER — AMOXICILLIN 500 MG PO TABS
2000.0000 mg | ORAL_TABLET | Freq: Once | ORAL | 0 refills | Status: AC
Start: 2023-03-28 — End: 2023-03-28

## 2023-03-28 NOTE — Progress Notes (Signed)
 Cardiology Office Note:    Date:  03/28/2023   ID:  [name], DOB Jan 04, 1956, MRN [id number]  PCP:  Louis Matte, MD   [facility] Providers Cardiologist:  Debbe Odea, MD Electrophysiologist:  Lanier Prude, MD     Referring MD: [facility]*   No chief complaint on file.   History of Present Illness:    [name] is a 68 y.o. male with a hx of hypertension, paroxysmal atrial fibrillation, hyperlipidemia, diabetes, CHB (s/p PPM Biotronik 01/2021) who presents for follow-up.  Patient states feeling okay, blood pressure better controlled at home with systolics in the 130s, diastolics 70s to 80s.  He is planning on getting some dental work done, tooth is planning to get pulled.  Compliant with medications as prescribed.  Feels well, has no concerns at this time.   Prior notes Echocardiogram 11/2020 normal systolic and diastolic function, mild LVH, EF 65 to 70% Cardiac monitor 11/2020 episodes of Mobitz 2 and third-degree AV block present  Past Medical History:  Diagnosis Date   Dermatophytosis of foot    Diabetes mellitus without complication (HCC)    Hypertension    Hypokalemia    Sebaceous cyst    Vitamin D deficiency     Past Surgical History:  Procedure Laterality Date   APPENDECTOMY     PACEMAKER IMPLANT N/A 02/09/2021   Procedure: PACEMAKER IMPLANT;  Surgeon: Lanier Prude, MD;  Location: [facility] INVASIVE CV LAB;  Service: Cardiovascular;  Laterality: N/A;    Current Medications: Current Meds  Medication Sig   amLODipine (NORVASC) 10 MG tablet Take 10 mg by mouth daily.   ammonium lactate (LAC-HYDRIN) 12 % lotion Apply topically.   amoxicillin (AMOXIL) 500 MG tablet Take 4 tablets (2,000 mg total) by mouth once for 1 dose. Take 1 hour before your dental work.   apixaban (ELIQUIS) 5 MG TABS tablet Take 1 tablet (5 mg total) by mouth 2 (two) times daily.   atorvastatin (LIPITOR) 40 MG tablet Take 40 mg by mouth daily.    furosemide (LASIX) 20 MG tablet Take 1 tablet (20 mg total) by mouth as needed. PLEASE CALL OFFICE TO SCHEDULE APPOINTMENT PRIOR TO NEXT REFILL (first attempt)   glipiZIDE (GLUCOTROL) 10 MG tablet Take 10 mg by mouth 2 (two) times daily before a meal.   hydrALAZINE (APRESOLINE) 100 MG tablet Take 1 tablet by mouth twice daily   lisinopril (ZESTRIL) 40 MG tablet Take 1 tablet (40 mg total) by mouth daily. PLEASE CALL [phone] TO SCHEDULE YEARLY APPOINTMENT AND FOR FURTHER REFILLS. THANK YOU.   metFORMIN (GLUCOPHAGE) 1000 MG tablet Take 1,000 mg by mouth 2 (two) times daily.   pantoprazole (PROTONIX) 40 MG tablet Take 1 tablet (40 mg total) by mouth 2 (two) times daily.     Allergies:   Patient has no known allergies.   Social History   Socioeconomic History   Marital status: Single    Spouse name: Not on file   Number of children: Not on file   Years of education: Not on file   Highest education level: Not on file  Occupational History   Not on file  Tobacco Use   Smoking status: Former   Smokeless tobacco: Never   Tobacco comments:    Former smoker 11/19/22  Substance and Sexual Activity   Alcohol use: Yes    Comment: occ.   Drug use: Never   Sexual activity: Not on file  Other Topics Concern   Not  on file  Social History Narrative   Not on file   Social Drivers of Health   Financial Resource Strain: Not on file  Food Insecurity: Not on file  Transportation Needs: Not on file  Physical Activity: Not on file  Stress: Not on file  Social Connections: Not on file     Family History: The patient's family history includes Heart disease in his mother; Stroke in his father.  ROS:   Please see the history of present illness.     All other systems reviewed and are negative.  EKGs/Labs/Other Studies Reviewed:    The following studies were reviewed today:   EKG Interpretation Date/Time:  Monday March 28 2023 09:30:47 EDT Ventricular Rate:  77 PR  Interval:  386 QRS Duration:  122 QT Interval:  394 QTC Calculation: 445 R Axis:   -45  Text Interpretation: Atrial-sensed ventricular-paced rhythm with prolonged AV conduction Confirmed by Debbe Odea ([id number]) on 03/28/2023 9:52:21 AM    Recent Labs: 12/17/2022: Platelets 294 12/30/2022: ALT 18 12/31/2022: BUN 26; Creatinine, Ser 1.70; Hemoglobin 13.9; Potassium 4.3; Sodium 143  Recent Lipid Panel No results found for: "CHOL", "TRIG", "HDL", "CHOLHDL", "VLDL", "LDLCALC", "LDLDIRECT"   Risk Assessment/Calculations:         Physical Exam:    VS:  BP (!) 151/79 (BP Location: Left Arm, Patient Position: Sitting, Cuff Size: Normal)   Pulse 77   Ht 5\' 10"  (1.778 m)   Wt 214 lb 12.8 oz (97.4 kg)   SpO2 95%   BMI 30.82 kg/m     Wt Readings from Last 3 Encounters:  03/28/23 214 lb 12.8 oz (97.4 kg)  12/17/22 209 lb 9.6 oz (95.1 kg)  11/19/22 210 lb 6.4 oz (95.4 kg)     GEN:  Well nourished, well developed in no acute distress HEENT: Normal NECK: No JVD; No carotid bruits CARDIAC: RRR, no murmurs, rubs, gallops RESPIRATORY:  Clear to auscultation without rales, wheezing or rhonchi  ABDOMEN: Soft, non-tender, non-distended MUSCULOSKELETAL:  No edema; No deformity  SKIN: Warm and dry NEUROLOGIC:  Alert and oriented x 3 PSYCHIATRIC:  Normal affect   ASSESSMENT:    1. Complete heart block (HCC)   2. Paroxysmal atrial fibrillation (HCC)   3. Primary hypertension   4. Pre-procedural cardiovascular examination    PLAN:    In order of problems listed above:  Complete heart block s/p PPM 01/2021 .  Pacemaker appears to be functioning normally.  EKG showing a sensed-V paced rhythm.  Follows up with device clinic. Paroxysmal atrial fibrillation, currently in sinus.  Continue Eliquis 5 mg twice daily. Hypertension, BP elevated, better controlled at home. continue hydralazine to 100 mg 3 times daily, lisinopril 40 mg daily, amlodipine 10 mg daily.  If BP elevated at  follow-up visit, add Coreg. Dental work being planned.  Patient has a pacemaker.  Invasive dental procedure including pulling teeth.  Will recommend antibiotics 2 g amoxicillin 1 hour prior to procedure.  Okay to hold Eliquis 48 hours prior to procedure.  Restart as soon as safely possible after.  Follow-up in 3 months.   Medication Adjustments/Labs and Tests Ordered: Current medicines are reviewed at length with the patient today.  Concerns regarding medicines are outlined above.  Orders Placed This Encounter  Procedures   EKG 12-Lead    Meds ordered this encounter  Medications   amoxicillin (AMOXIL) 500 MG tablet    Sig: Take 4 tablets (2,000 mg total) by mouth once for 1 dose. Take 1 hour  before your dental work.    Dispense:  4 tablet    Refill:  0     Patient Instructions  Medication Instructions:  Your physician has recommended you make the following change in your medication:   Amoxicillin 2 grams 1 hour before your dental work Hold Eliquis 48 hours before your dental work  *If you need a refill on your cardiac medications before your next appointment, please call your pharmacy*   Lab Work: None  If you have labs (blood work) drawn today and your tests are completely normal, you will receive your results only by: MyChart Message (if you have MyChart) OR A paper copy in the mail If you have any lab test that is abnormal or we need to change your treatment, we will call you to review the results.   Testing/Procedures: None   Follow-Up: At [facility], you and your health needs are our priority.  As part of our continuing mission to provide you with exceptional heart care, we have created designated Provider Care Teams.  These Care Teams include your primary Cardiologist (physician) and Advanced Practice Providers (APPs -  Physician Assistants and Nurse Practitioners) who all work together to provide you with the care you need, when you need it.  We  recommend signing up for the patient portal called "MyChart".  Sign up information is provided on this After Visit Summary.  MyChart is used to connect with patients for Virtual Visits (Telemedicine).  Patients are able to view lab/test results, encounter notes, upcoming appointments, etc.  Non-urgent messages can be sent to your provider as well.   To learn more about what you can do with MyChart, go to [email or web address].    Your next appointment:   3 month(s)  Provider:   Debbe Odea, MD      Signed, Debbe Odea, MD  03/28/2023 10:41 AM    [facility]

## 2023-03-28 NOTE — Patient Instructions (Signed)
 Medication Instructions:  Your physician has recommended you make the following change in your medication:   Amoxicillin 2 grams 1 hour before your dental work Hold Eliquis 48 hours before your dental work  *If you need a refill on your cardiac medications before your next appointment, please call your pharmacy*   Lab Work: None  If you have labs (blood work) drawn today and your tests are completely normal, you will receive your results only by: MyChart Message (if you have MyChart) OR A paper copy in the mail If you have any lab test that is abnormal or we need to change your treatment, we will call you to review the results.   Testing/Procedures: None   Follow-Up: At [facility], you and your health needs are our priority.  As part of our continuing mission to provide you with exceptional heart care, we have created designated Provider Care Teams.  These Care Teams include your primary Cardiologist (physician) and Advanced Practice Providers (APPs -  Physician Assistants and Nurse Practitioners) who all work together to provide you with the care you need, when you need it.  We recommend signing up for the patient portal called "MyChart".  Sign up information is provided on this After Visit Summary.  MyChart is used to connect with patients for Virtual Visits (Telemedicine).  Patients are able to view lab/test results, encounter notes, upcoming appointments, etc.  Non-urgent messages can be sent to your provider as well.   To learn more about what you can do with MyChart, go to [email or web address].    Your next appointment:   3 month(s)  Provider:   Debbe Odea, MD

## 2023-04-19 NOTE — Progress Notes (Unsigned)
  Electrophysiology Office Follow up Visit Note:    Date:  04/20/2023   ID:  [name], DOB 01-Nov-1955, MRN [id number]  PCP:  Louis Matte, MD  [facility] Cardiologist:  Brian Odea, MD  [facility] Electrophysiologist:  Brian Prude, MD    Interval History:     [name] is a 68 y.o. male who presents for a follow up visit.   He was last seen by [name] April 23, 2022.  He has a history of complete heart block with a permanent pacemaker.  His medical history also includes hypertension, diabetes and peripheral vascular disease.  He saw Dr. Azucena [name] in clinic March 28, 2023.  Today he is doing well. No complaints. Good energy level.       Past medical, surgical, social and family history were reviewed.  ROS:   Please see the history of present illness.    All other systems reviewed and are negative.  EKGs/Labs/Other Studies Reviewed:    The following studies were reviewed today:  April 20, 2023 in clinic device interrogation personally reviewed 99% V pacing. Underlying is 2:1 AV block Battery and lead parameters stable. Shortened AV delay to today for more physiologic filling time.          Physical Exam:    VS:  BP (!) 162/84   Pulse 77   Ht 5\' 9"  (1.753 m)   Wt 206 lb 3.2 oz (93.5 kg)   SpO2 99%   BMI 30.45 kg/m     Repeat BP 140/70  Wt Readings from Last 3 Encounters:  04/20/23 206 lb 3.2 oz (93.5 kg)  03/28/23 214 lb 12.8 oz (97.4 kg)  12/17/22 209 lb 9.6 oz (95.1 kg)     GEN: no distress CARD: RRR, No MRG.  CIED pocket well-healed RESP: No IWOB. CTAB.      ASSESSMENT:    1. Complete heart block (HCC)   2. Paroxysmal atrial fibrillation (HCC)   3. Primary hypertension   4. Cardiac pacemaker in situ    PLAN:    In order of problems listed above:  #Complete heart block #Permanent pacemaker in situ Device functioning appropriately.  Continue remote monitoring. Pacing burden has increased.  Will order an echo to confirm stable LV function in setting of RV pacing.  #Paroxysmal atrial fibrillation Continue Eliquis for stroke prophylaxis  #Hypertension Above goal today initially, repeat OK.  Recommend checking blood pressures 1-2 times per week at home and recording the values.  Recommend bringing these recordings to the primary care physician. Continue lisinopril, hydralazine, Lasix, amlodipine  Follow-up with EP APP in 1 year   Signed, Brian Dunn, MD, [facility], [facility] 04/20/2023 9:33 AM    Electrophysiology [facility]

## 2023-04-20 ENCOUNTER — Encounter: Payer: Self-pay | Admitting: Cardiology

## 2023-04-20 ENCOUNTER — Ambulatory Visit: Attending: Cardiology | Admitting: Cardiology

## 2023-04-20 VITALS — BP 162/84 | HR 77 | Ht 69.0 in | Wt 206.2 lb

## 2023-04-20 DIAGNOSIS — Z950 Presence of cardiac pacemaker: Secondary | ICD-10-CM | POA: Diagnosis not present

## 2023-04-20 DIAGNOSIS — I442 Atrioventricular block, complete: Secondary | ICD-10-CM

## 2023-04-20 DIAGNOSIS — I10 Essential (primary) hypertension: Secondary | ICD-10-CM | POA: Diagnosis not present

## 2023-04-20 DIAGNOSIS — I480 Paroxysmal atrial fibrillation: Secondary | ICD-10-CM

## 2023-04-20 NOTE — Patient Instructions (Signed)
 Medication Instructions:  Your physician recommends that you continue on your current medications as directed. Please refer to the Current Medication list given to you today.  *If you need a refill on your cardiac medications before your next appointment, please call your pharmacy*  Test/Procedures Echocardiogram Your physician has requested that you have an echocardiogram. Echocardiography is a painless test that uses sound waves to create images of your heart. It provides your doctor with information about the size and shape of your heart and how well your heart's chambers and valves are working. This procedure takes approximately one hour. There are no restrictions for this procedure. Please do NOT wear cologne, perfume, aftershave, or lotions (deodorant is allowed). Please arrive 15 minutes prior to your appointment time.  Please note: We ask at that you not bring children with you during ultrasound (echo/ vascular) testing. Due to room size and safety concerns, children are not allowed in the ultrasound rooms during exams. Our front office staff cannot provide observation of children in our lobby area while testing is being conducted. An adult accompanying a patient to their appointment will only be allowed in the ultrasound room at the discretion of the ultrasound technician under special circumstances. We apologize for any inconvenience.  Follow-Up: At [facility], you and your health needs are our priority.  As part of our continuing mission to provide you with exceptional heart care, our providers are all part of one team.  This team includes your primary Cardiologist (physician) and Advanced Practice Providers or APPs (Physician Assistants and Nurse Practitioners) who all work together to provide you with the care you need, when you need it.  Your next appointment:   1 year  Provider:   Sherie Don, NP

## 2023-04-25 ENCOUNTER — Telehealth: Payer: Self-pay

## 2023-04-25 NOTE — Telephone Encounter (Signed)
 Call back received from Pt.  He does not recall any symptoms related to this episode.  Pt is scheduled for echocardiogram 05/16/2023 with close follow up.  Will forward to physician for review.

## 2023-04-25 NOTE — Telephone Encounter (Addendum)
 Alert received from [employer] for episode of NSVT.  Attempted outreach to Pt.  Call went to VM.  Mailbox not set up.

## 2023-05-10 ENCOUNTER — Ambulatory Visit (INDEPENDENT_AMBULATORY_CARE_PROVIDER_SITE_OTHER): Payer: Medicare (Managed Care)

## 2023-05-10 DIAGNOSIS — I442 Atrioventricular block, complete: Secondary | ICD-10-CM | POA: Diagnosis not present

## 2023-05-10 LAB — CUP PACEART REMOTE DEVICE CHECK
Battery Voltage: 80
Date Time Interrogation Session: 20250429083313
Implantable Lead Connection Status: 753985
Implantable Lead Connection Status: 753985
Implantable Lead Implant Date: 20230130
Implantable Lead Implant Date: 20230130
Implantable Lead Location: 753859
Implantable Lead Location: 753860
Implantable Lead Model: 377171
Implantable Lead Model: 377171
Implantable Lead Serial Number: 7000391305
Implantable Lead Serial Number: 8000666643
Implantable Pulse Generator Implant Date: 20230130
Pulse Gen Model: 407145
Pulse Gen Serial Number: 70278108

## 2023-05-15 DIAGNOSIS — I442 Atrioventricular block, complete: Secondary | ICD-10-CM | POA: Diagnosis not present

## 2023-05-16 ENCOUNTER — Ambulatory Visit: Attending: Cardiology

## 2023-05-16 DIAGNOSIS — I10 Essential (primary) hypertension: Secondary | ICD-10-CM | POA: Diagnosis not present

## 2023-05-16 DIAGNOSIS — I442 Atrioventricular block, complete: Secondary | ICD-10-CM

## 2023-05-16 DIAGNOSIS — I480 Paroxysmal atrial fibrillation: Secondary | ICD-10-CM | POA: Diagnosis not present

## 2023-05-16 DIAGNOSIS — Z950 Presence of cardiac pacemaker: Secondary | ICD-10-CM | POA: Diagnosis not present

## 2023-05-16 LAB — ECHOCARDIOGRAM COMPLETE
AR max vel: 3.18 cm2
AV Area VTI: 2.96 cm2
AV Area mean vel: 2.85 cm2
AV Mean grad: 5 mmHg
AV Peak grad: 10.2 mmHg
Ao pk vel: 1.6 m/s
Area-P 1/2: 3.37 cm2
S' Lateral: 3.74 cm

## 2023-06-24 NOTE — Progress Notes (Signed)
 Remote pacemaker transmission.

## 2023-06-28 ENCOUNTER — Ambulatory Visit: Attending: Cardiology | Admitting: Cardiology

## 2023-06-28 ENCOUNTER — Encounter: Payer: Self-pay | Admitting: Cardiology

## 2023-06-28 VITALS — BP 158/78 | HR 88 | Ht 69.0 in | Wt 214.0 lb

## 2023-06-28 DIAGNOSIS — I10 Essential (primary) hypertension: Secondary | ICD-10-CM

## 2023-06-28 DIAGNOSIS — I442 Atrioventricular block, complete: Secondary | ICD-10-CM | POA: Diagnosis not present

## 2023-06-28 DIAGNOSIS — I480 Paroxysmal atrial fibrillation: Secondary | ICD-10-CM

## 2023-06-28 NOTE — Patient Instructions (Signed)
 Medication Instructions:   Your physician recommends that you continue on your current medications as directed. Please refer to the Current Medication list given to you today.   *If you need a refill on your cardiac medications before your next appointment, please call your pharmacy*  Lab Work:  No labs ordered today   If you have labs (blood work) drawn today and your tests are completely normal, you will receive your results only by: MyChart Message (if you have MyChart) OR A paper copy in the mail If you have any lab test that is abnormal or we need to change your treatment, we will call you to review the results.  Testing/Procedures:  No test ordered today   Follow-Up: At [facility], you and your health needs are our priority.  As part of our continuing mission to provide you with exceptional heart care, our providers are all part of one team.  This team includes your primary Cardiologist (physician) and Advanced Practice Providers or APPs (Physician Assistants and Nurse Practitioners) who all work together to provide you with the care you need, when you need it.  Your next appointment:   3 month(s)  Provider:   You may see Constancia Delton, MD or one of the following Advanced Practice Providers on your designated Care Team:   Laneta Pintos, NP Gildardo Labrador, PA-C Varney Gentleman, PA-C Cadence Francis, PA-C Ronald Cockayne, NP Morey Ar, NP    We recommend signing up for the patient portal called MyChart.  Sign up information is provided on this After Visit Summary.  MyChart is used to connect with patients for Virtual Visits (Telemedicine).  Patients are able to view lab/test results, encounter notes, upcoming appointments, etc.  Non-urgent messages can be sent to your provider as well.   To learn more about what you can do with MyChart, go to [email or web address].

## 2023-06-28 NOTE — Progress Notes (Signed)
 Cardiology Office Note:    Date:  06/28/2023   ID:  [name], DOB 11-01-1955, MRN [id number]  PCP:  Benuel Brazier, MD   [facility] Providers Cardiologist:  Constancia Delton, MD Electrophysiologist:  Boyce Byes, MD     Referring MD: Benuel Brazier,*   Chief Complaint  Patient presents with   Follow-up    3 month f/u no complaints today. Meds reviewed verbally with pt.    History of Present Illness:    [name] is a 68 y.o. male with a hx of hypertension, paroxysmal atrial fibrillation, hyperlipidemia, diabetes, CHB (s/p PPM [employer] 01/2021) who presents for follow-up.  Doing okay, compliant with medications as prescribed.  BP at home ranges in the 130s to 150s.  Endorses eating salty foods, states having grapes couple of days ago which might have caused his BP to be elevated today.  Otherwise has no concerns.   Prior notes Echocardiogram 11/2020 normal systolic and diastolic function, mild LVH, EF 65 to 70% Cardiac monitor 11/2020 episodes of Mobitz 2 and third-degree AV block present  Past Medical History:  Diagnosis Date   Dermatophytosis of foot    Diabetes mellitus without complication (HCC)    Hypertension    Hypokalemia    Sebaceous cyst    Vitamin D deficiency     Past Surgical History:  Procedure Laterality Date   APPENDECTOMY     PACEMAKER IMPLANT N/A 02/09/2021   Procedure: PACEMAKER IMPLANT;  Surgeon: Boyce Byes, MD;  Location: [facility] INVASIVE CV LAB;  Service: Cardiovascular;  Laterality: N/A;    Current Medications: Current Meds  Medication Sig   amLODipine  (NORVASC ) 10 MG tablet Take 10 mg by mouth daily.   ammonium lactate (LAC-HYDRIN) 12 % lotion Apply topically.   apixaban  (ELIQUIS ) 5 MG TABS tablet Take 1 tablet (5 mg total) by mouth 2 (two) times daily.   atorvastatin  (LIPITOR) 40 MG tablet Take 40 mg by mouth daily.   furosemide  (LASIX ) 20 MG tablet Take 1 tablet (20 mg total) by mouth as needed.  PLEASE CALL OFFICE TO SCHEDULE APPOINTMENT PRIOR TO NEXT REFILL (first attempt)   glipiZIDE (GLUCOTROL) 10 MG tablet Take 10 mg by mouth 2 (two) times daily before a meal.   hydrALAZINE  (APRESOLINE ) 100 MG tablet Take 1 tablet by mouth twice daily   lisinopril  (ZESTRIL ) 40 MG tablet Take 1 tablet (40 mg total) by mouth daily. PLEASE CALL [phone] TO SCHEDULE YEARLY APPOINTMENT AND FOR FURTHER REFILLS. THANK YOU.   metFORMIN (GLUCOPHAGE) 1000 MG tablet Take 1,000 mg by mouth 2 (two) times daily.   pantoprazole  (PROTONIX ) 40 MG tablet Take 1 tablet (40 mg total) by mouth 2 (two) times daily.     Allergies:   Patient has no known allergies.   Social History   Socioeconomic History   Marital status: Single    Spouse name: Not on file   Number of children: Not on file   Years of education: Not on file   Highest education level: Not on file  Occupational History   Not on file  Tobacco Use   Smoking status: Former   Smokeless tobacco: Never   Tobacco comments:    Former smoker 11/19/22  Substance and Sexual Activity   Alcohol use: Yes    Comment: occ.   Drug use: Never   Sexual activity: Not on file  Other Topics Concern   Not on file  Social History Narrative   Not on file  Social Drivers of Corporate investment banker Strain: Not on file  Food Insecurity: Not on file  Transportation Needs: Not on file  Physical Activity: Not on file  Stress: Not on file  Social Connections: Not on file     Family History: The patient's family history includes Heart disease in his mother; Stroke in his father.  ROS:   Please see the history of present illness.     All other systems reviewed and are negative.  EKGs/Labs/Other Studies Reviewed:    The following studies were reviewed today:   EKG Interpretation Date/Time:  Tuesday June 28 2023 09:09:40 EDT Ventricular Rate:  88 PR Interval:  284 QRS Duration:  144 QT Interval:  402 QTC Calculation: 486 R Axis:   -45  Text  Interpretation: Atrial-sensed ventricular-paced rhythm with prolonged AV conduction When compared with ECG of 28-Mar-2023 09:30, Vent. rate has increased BY  11 BPM Confirmed by Constancia Delton ([id number]) on 06/28/2023 9:18:28 AM    Recent Labs: 12/17/2022: Platelets 294 12/30/2022: ALT 18 12/31/2022: BUN 26; Creatinine, Ser 1.70; Hemoglobin 13.9; Potassium 4.3; Sodium 143  Recent Lipid Panel No results found for: CHOL, TRIG, HDL, CHOLHDL, VLDL, LDLCALC, LDLDIRECT   Risk Assessment/Calculations:         Physical Exam:    VS:  BP (!) 158/78 (BP Location: Left Arm, Patient Position: Sitting, Cuff Size: Large)   Pulse 88   Ht 5' 9 (1.753 m)   Wt 214 lb (97.1 kg)   SpO2 98%   BMI 31.60 kg/m     Wt Readings from Last 3 Encounters:  06/28/23 214 lb (97.1 kg)  04/20/23 206 lb 3.2 oz (93.5 kg)  03/28/23 214 lb 12.8 oz (97.4 kg)     GEN:  Well nourished, well developed in no acute distress HEENT: Normal NECK: No JVD; No carotid bruits CARDIAC: RRR, no murmurs, rubs, gallops RESPIRATORY:  Clear to auscultation without rales, wheezing or rhonchi  ABDOMEN: Soft, non-tender, non-distended MUSCULOSKELETAL:  No edema; No deformity  SKIN: Warm and dry NEUROLOGIC:  Alert and oriented x 3 PSYCHIATRIC:  Normal affect   ASSESSMENT:    1. Primary hypertension   2. Complete heart block (HCC)   3. Paroxysmal atrial fibrillation (HCC)    PLAN:    In order of problems listed above:  Hypertension, BP elevated, patient does not want to start new medication today.  Low-salt diet and increased activity strongly emphasized.  Recommend starting Coreg at follow-up visit if patient is agreeable and BP still elevated.  Continue hydralazine  to 100 mg 3 times daily, lisinopril  40 mg daily, amlodipine  10 mg daily.   Complete heart block s/p PPM 01/2021 .  Pacemaker appears to be functioning normally.  EKG showing a sensed-V paced rhythm.  Follows up with device clinic. Paroxysmal atrial  fibrillation, currently in sinus.  Continue Eliquis  5 mg twice daily.  Follow-up in 3 months.   Medication Adjustments/Labs and Tests Ordered: Current medicines are reviewed at length with the patient today.  Concerns regarding medicines are outlined above.  Orders Placed This Encounter  Procedures   EKG 12-Lead    No orders of the defined types were placed in this encounter.    Patient Instructions  Medication Instructions:   Your physician recommends that you continue on your current medications as directed. Please refer to the Current Medication list given to you today.   *If you need a refill on your cardiac medications before your next appointment, please call your pharmacy*  Lab Work:  No labs ordered today   If you have labs (blood work) drawn today and your tests are completely normal, you will receive your results only by: MyChart Message (if you have MyChart) OR A paper copy in the mail If you have any lab test that is abnormal or we need to change your treatment, we will call you to review the results.  Testing/Procedures:  No test ordered today   Follow-Up: At [facility], you and your health needs are our priority.  As part of our continuing mission to provide you with exceptional heart care, our providers are all part of one team.  This team includes your primary Cardiologist (physician) and Advanced Practice Providers or APPs (Physician Assistants and Nurse Practitioners) who all work together to provide you with the care you need, when you need it.  Your next appointment:   3 month(s)  Provider:   You may see Constancia Delton, MD or one of the following Advanced Practice Providers on your designated Care Team:   Laneta Pintos, NP Gildardo Labrador, PA-C Varney Gentleman, PA-C Cadence Lisbon, PA-C Ronald Cockayne, NP Morey Ar, NP    We recommend signing up for the patient portal called MyChart.  Sign up information is provided on this After  Visit Summary.  MyChart is used to connect with patients for Virtual Visits (Telemedicine).  Patients are able to view lab/test results, encounter notes, upcoming appointments, etc.  Non-urgent messages can be sent to your provider as well.   To learn more about what you can do with MyChart, go to [email or web address].          Signed, Constancia Delton, MD  06/28/2023 9:56 AM    [facility]

## 2023-07-23 IMAGING — CR DG CHEST 2V
2 series · 2 of 2 positions shown · non-contrast
Comparison: 11/11/2020

CLINICAL DATA: Bradycardia, shortness of breath

EXAM:
CHEST - 2 VIEW

[chest pa]
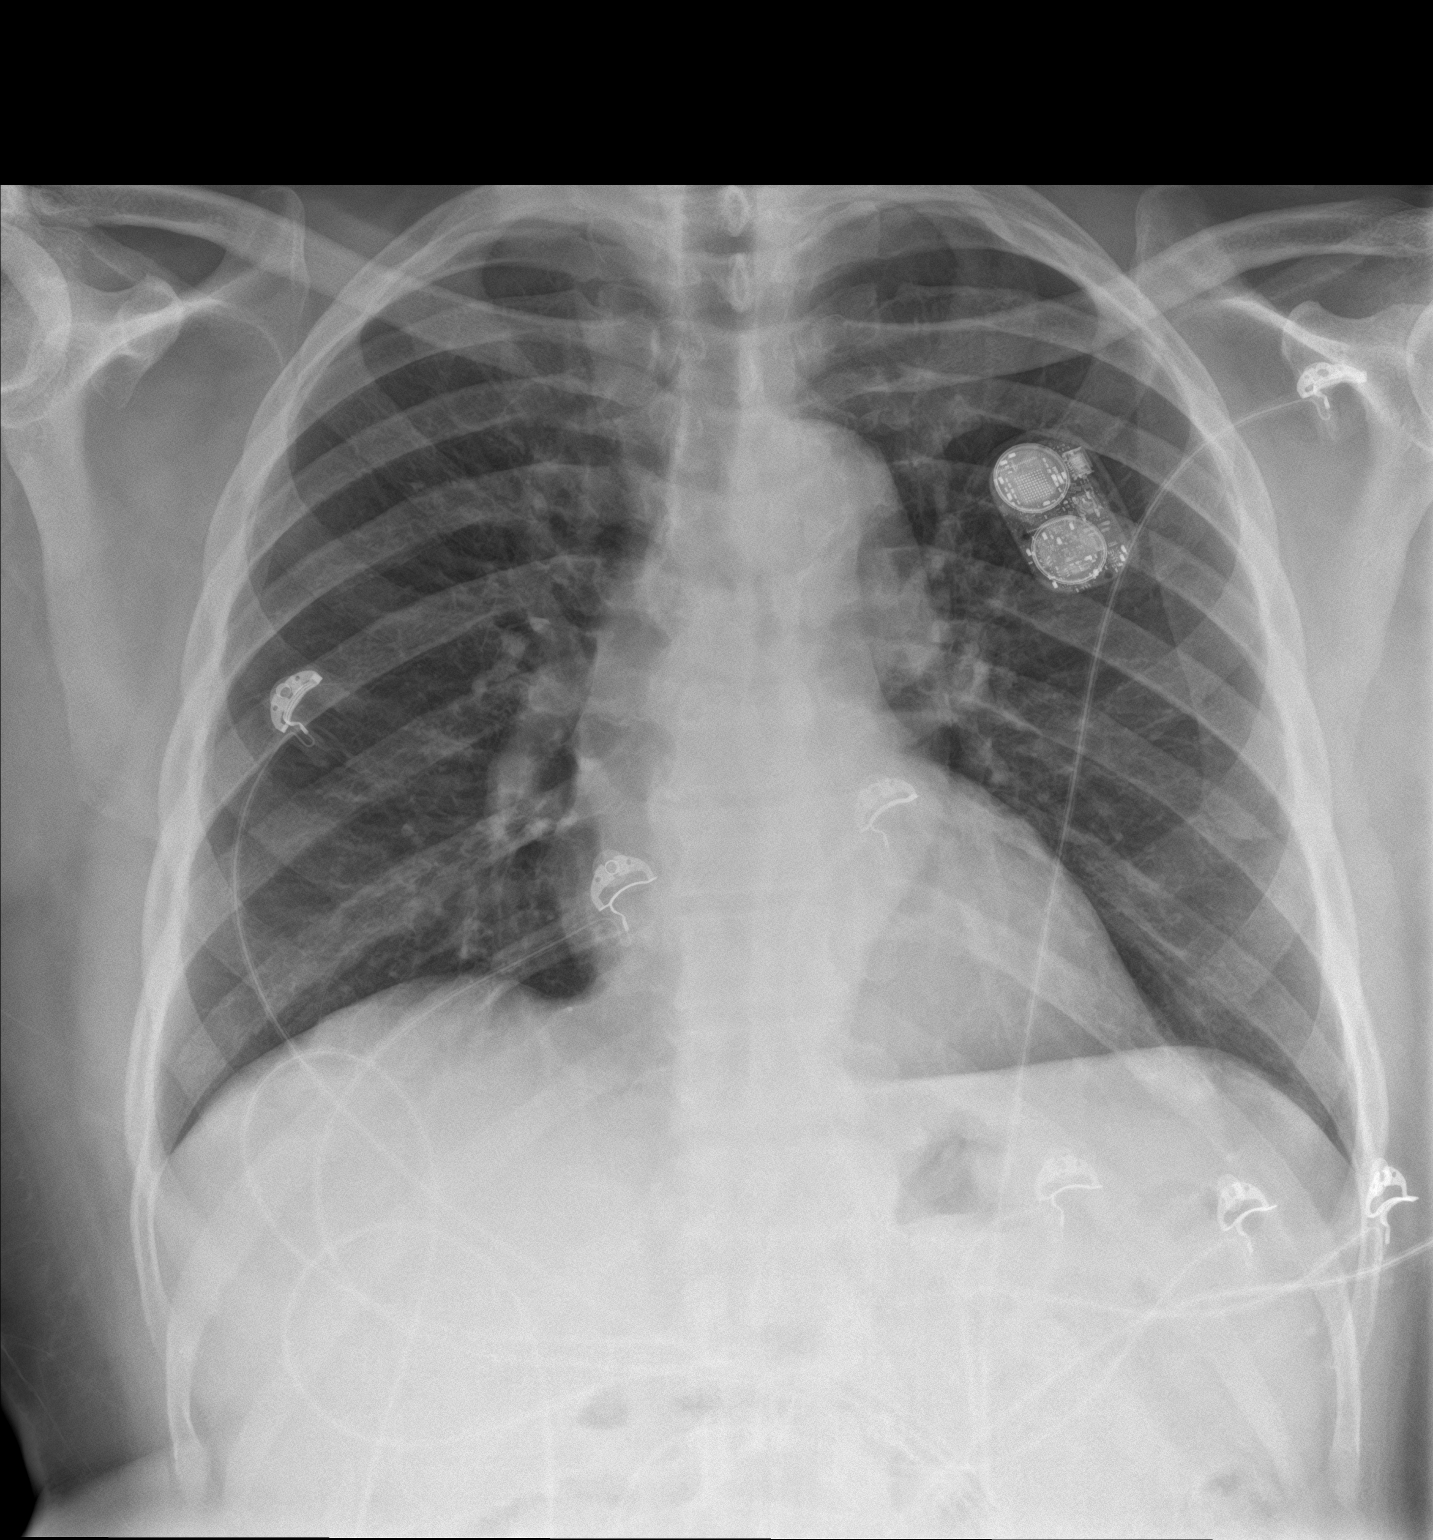

[chest lat]
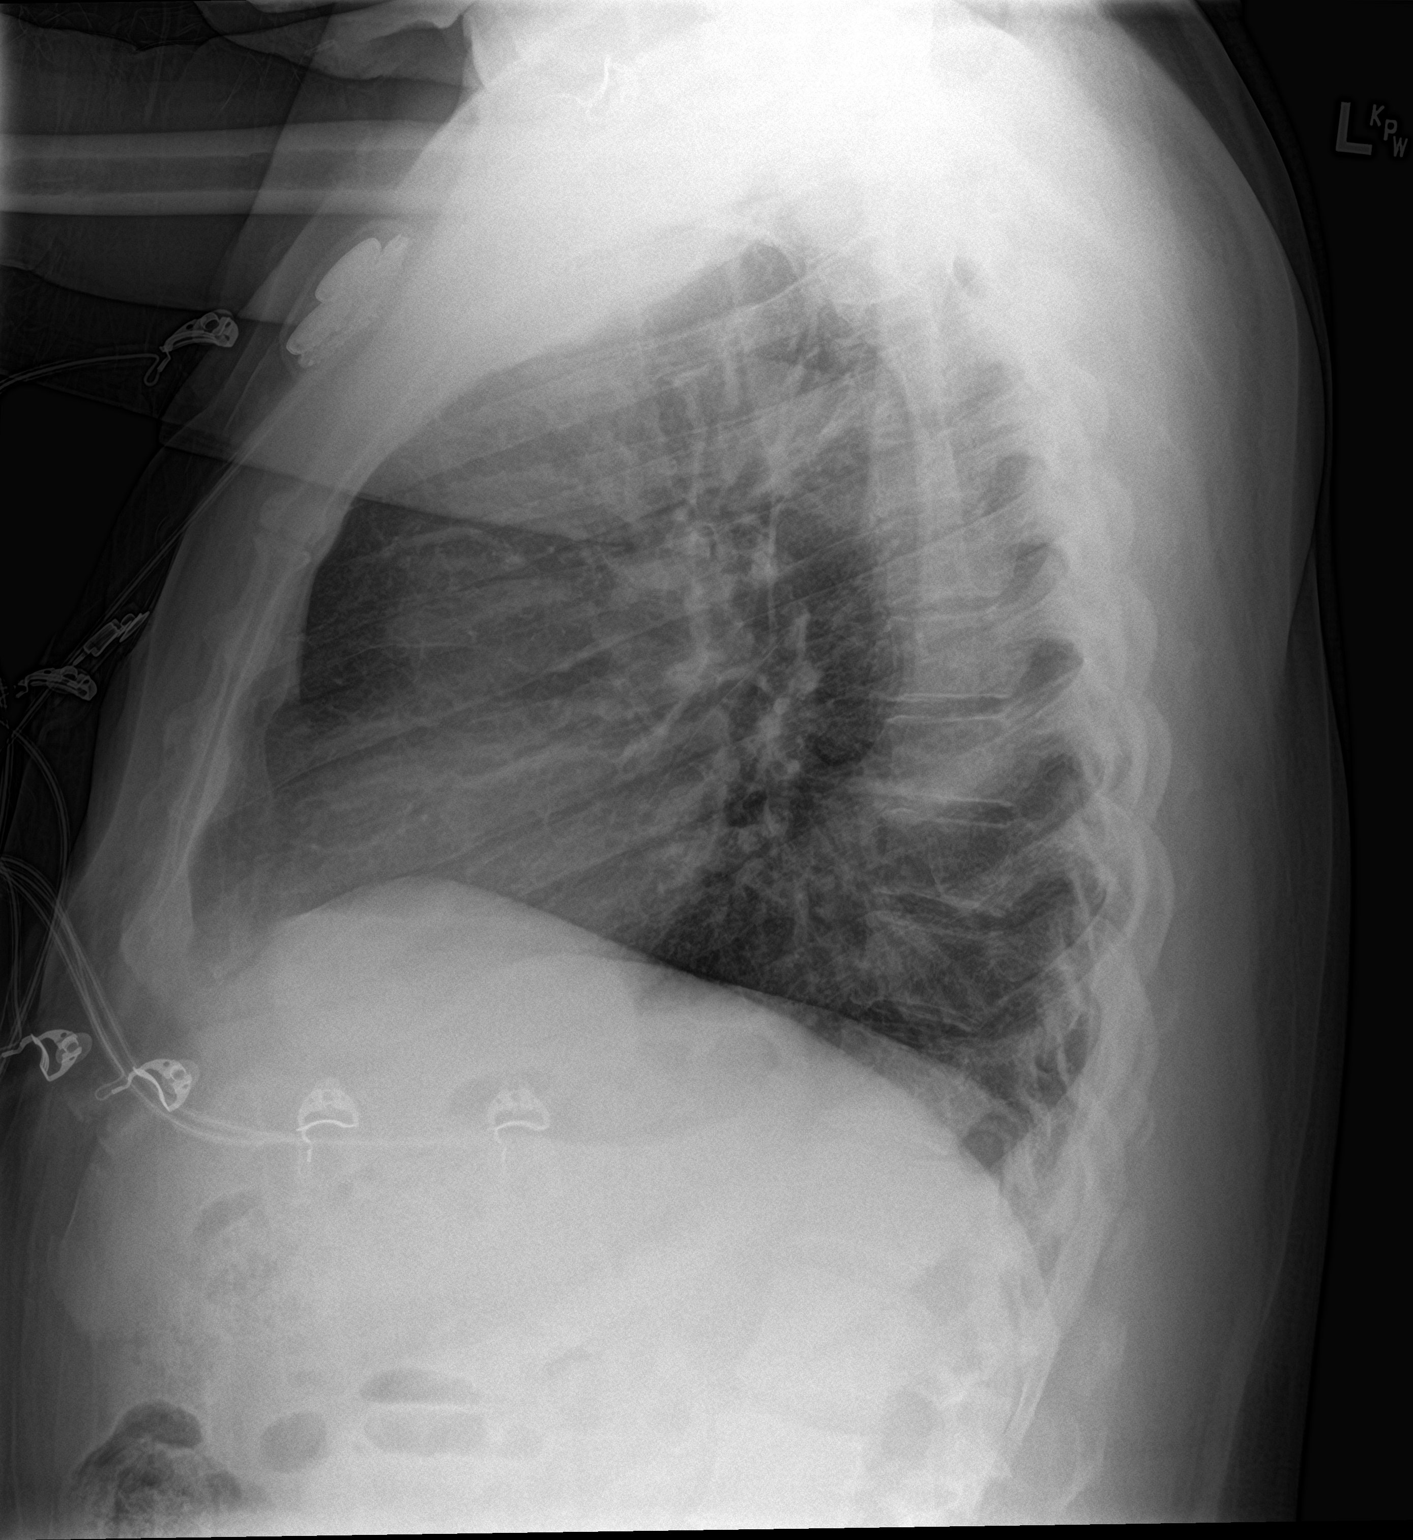

[2 of 2 positions shown; findings below may reference images not displayed]

FINDINGS: Cardiac size is within normal limits. Thoracic aorta is tortuous and
ectatic. Lung fields are clear of any infiltrate or pulmonary edema.
There is no pleural effusion or pneumothorax.
IMPRESSION: No active cardiopulmonary disease.

## 2023-08-09 ENCOUNTER — Ambulatory Visit: Payer: Medicare (Managed Care)

## 2023-08-09 DIAGNOSIS — I442 Atrioventricular block, complete: Secondary | ICD-10-CM

## 2023-08-09 LAB — CUP PACEART REMOTE DEVICE CHECK
Date Time Interrogation Session: 20250729094126
Implantable Lead Connection Status: 753985
Implantable Lead Connection Status: 753985
Implantable Lead Implant Date: 20230130
Implantable Lead Implant Date: 20230130
Implantable Lead Location: 753859
Implantable Lead Location: 753860
Implantable Lead Model: 377171
Implantable Lead Model: 377171
Implantable Lead Serial Number: 7000391305
Implantable Lead Serial Number: 8000666643
Implantable Pulse Generator Implant Date: 20230130
Pulse Gen Model: 407145
Pulse Gen Serial Number: 70278108

## 2023-08-11 ENCOUNTER — Ambulatory Visit: Payer: Self-pay | Admitting: Cardiology

## 2023-09-15 ENCOUNTER — Telehealth: Payer: Self-pay

## 2023-09-15 NOTE — Telephone Encounter (Signed)
 Pt most likely asleep during episode.  Will route to physician for review and advisement.

## 2023-09-15 NOTE — Telephone Encounter (Signed)
 Alert received from [employer] for HVR:  Number of high ven. rate episodes (HVR) per day above limit (at least one) Last value 1 episode(s) per day reported on Sep 15, 2023, 1:48:00 AM

## 2023-09-22 NOTE — Telephone Encounter (Signed)
 No changes per Dr. Cindie

## 2023-09-28 ENCOUNTER — Ambulatory Visit: Attending: Cardiology | Admitting: Cardiology

## 2023-10-03 ENCOUNTER — Ambulatory Visit: Attending: Cardiology | Admitting: Cardiology

## 2023-10-03 ENCOUNTER — Encounter: Payer: Self-pay | Admitting: Cardiology

## 2023-10-03 VITALS — BP 138/60 | HR 75 | Ht 69.0 in | Wt 214.2 lb

## 2023-10-03 DIAGNOSIS — I442 Atrioventricular block, complete: Secondary | ICD-10-CM | POA: Diagnosis not present

## 2023-10-03 DIAGNOSIS — I10 Essential (primary) hypertension: Secondary | ICD-10-CM | POA: Insufficient documentation

## 2023-10-03 DIAGNOSIS — I480 Paroxysmal atrial fibrillation: Secondary | ICD-10-CM | POA: Insufficient documentation

## 2023-10-03 MED ORDER — HYDRALAZINE HCL 100 MG PO TABS: 100.0000 mg | ORAL_TABLET | Freq: Three times a day (TID) | ORAL | 3 refills | Status: AC

## 2023-10-03 NOTE — Patient Instructions (Signed)
 Medication Instructions:  - INCREASE hydralazine  to three times daily   *If you need a refill on your cardiac medications before your next appointment, please call your pharmacy*  Lab Work: No labs ordered today  If you have labs (blood work) drawn today and your tests are completely normal, you will receive your results only by: MyChart Message (if you have MyChart) OR A paper copy in the mail If you have any lab test that is abnormal or we need to change your treatment, we will call you to review the results.  Testing/Procedures: No test ordered today   Follow-Up: At [facility], you and your health needs are our priority.  As part of our continuing mission to provide you with exceptional heart care, our providers are all part of one team.  This team includes your primary Cardiologist (physician) and Advanced Practice Providers or APPs (Physician Assistants and Nurse Practitioners) who all work together to provide you with the care you need, when you need it.  Your next appointment:   6 month(s)  Provider:   You may see Redell Cave, MD or one of the following Advanced Practice Providers on your designated Care Team:   Lonni Meager, NP Lesley Maffucci, PA-C Bernardino Bring, PA-C Cadence Haena, PA-C Tylene Lunch, NP Barnie Hila, NP    We recommend signing up for the patient portal called MyChart.  Sign up information is provided on this After Visit Summary.  MyChart is used to connect with patients for Virtual Visits (Telemedicine).  Patients are able to view lab/test results, encounter notes, upcoming appointments, etc.  Non-urgent messages can be sent to your provider as well.   To learn more about what you can do with MyChart, go to [email or web address].

## 2023-10-03 NOTE — Progress Notes (Signed)
 Cardiology Office Note:    Date:  10/03/2023   ID:  [name], DOB Jul 02, 1955, MRN [id number]  PCP:  Sampson Ethridge DELENA, MD   [facility] Providers Cardiologist:  Redell Cave, MD Electrophysiologist:  OLE ONEIDA HOLTS, MD     Referring MD: Sampson Ethridge DELENA,*   Chief Complaint  Patient presents with   Follow-up    Pt doing good.    History of Present Illness:    [name] is a 68 y.o. male with a hx of hypertension, paroxysmal atrial fibrillation, hyperlipidemia, diabetes, CHB (s/p PPM Biotronik 01/2021) who presents for follow-up.  Blood pressure was elevated during last visit, additional meds recommended, patient wanted to try diet and exercising.  States being compliant with medications as prescribed.  Blood pressure at home ranges in the 130s to 150s systolic.  He sometimes misses afternoon dose of hydralazine .   Prior notes Echocardiogram 11/2020 normal systolic and diastolic function, mild LVH, EF 65 to 70% Cardiac monitor 11/2020 episodes of Mobitz 2 and third-degree AV block present  Past Medical History:  Diagnosis Date   Dermatophytosis of foot    Diabetes mellitus without complication (HCC)    Hypertension    Hypokalemia    Sebaceous cyst    Vitamin D deficiency     Past Surgical History:  Procedure Laterality Date   APPENDECTOMY     PACEMAKER IMPLANT N/A 02/09/2021   Procedure: PACEMAKER IMPLANT;  Surgeon: HOLTS OLE ONEIDA, MD;  Location: [facility];  Service: Cardiovascular;  Laterality: N/A;    Current Medications: Current Meds  Medication Sig   amLODipine  (NORVASC ) 10 MG tablet Take 10 mg by mouth daily.   ammonium lactate (LAC-HYDRIN) 12 % lotion Apply topically.   apixaban  (ELIQUIS ) 5 MG TABS tablet Take 1 tablet (5 mg total) by mouth 2 (two) times daily.   atorvastatin  (LIPITOR) 40 MG tablet Take 40 mg by mouth daily.   furosemide  (LASIX ) 20 MG tablet Take 1 tablet (20 mg total) by mouth as needed. PLEASE  CALL OFFICE TO SCHEDULE APPOINTMENT PRIOR TO NEXT REFILL (first attempt)   glipiZIDE (GLUCOTROL) 10 MG tablet Take 10 mg by mouth 2 (two) times daily before a meal.   lisinopril  (ZESTRIL ) 40 MG tablet Take 1 tablet (40 mg total) by mouth daily. PLEASE CALL [phone] TO SCHEDULE YEARLY APPOINTMENT AND FOR FURTHER REFILLS. THANK YOU.   metFORMIN (GLUCOPHAGE) 1000 MG tablet Take 1,000 mg by mouth 2 (two) times daily.   pantoprazole  (PROTONIX ) 40 MG tablet Take 1 tablet (40 mg total) by mouth 2 (two) times daily.   [DISCONTINUED] hydrALAZINE  (APRESOLINE ) 100 MG tablet Take 1 tablet by mouth twice daily     Allergies:   Patient has no known allergies.   Social History   Socioeconomic History   Marital status: Single    Spouse name: Not on file   Number of children: Not on file   Years of education: Not on file   Highest education level: Not on file  Occupational History   Not on file  Tobacco Use   Smoking status: Former   Smokeless tobacco: Never   Tobacco comments:    Former smoker 11/19/22  Substance and Sexual Activity   Alcohol use: Yes    Comment: occ.   Drug use: Never   Sexual activity: Not on file  Other Topics Concern   Not on file  Social History Narrative   Not on file   Social Drivers of Health  Financial Resource Strain: Not on file  Food Insecurity: Not on file  Transportation Needs: Not on file  Physical Activity: Not on file  Stress: Not on file  Social Connections: Not on file     Family History: The patient's family history includes Heart disease in his mother; Stroke in his father.  ROS:   Please see the history of present illness.     All other systems reviewed and are negative.  EKGs/Labs/Other Studies Reviewed:    The following studies were reviewed today:   EKG Interpretation Date/Time:  Monday October 03 2023 11:36:37 EDT Ventricular Rate:  75 PR Interval:  156 QRS Duration:  104 QT Interval:  412 QTC Calculation: 460 R  Axis:   -30  Text Interpretation: Normal sinus rhythm Left axis deviation Confirmed by Darliss Rogue ([id number]) on 10/03/2023 11:50:57 AM    Recent Labs: 12/17/2022: Platelets 294 12/30/2022: ALT 18 12/31/2022: BUN 26; Creatinine, Ser 1.70; Hemoglobin 13.9; Potassium 4.3; Sodium 143  Recent Lipid Panel No results found for: CHOL, TRIG, HDL, CHOLHDL, VLDL, LDLCALC, LDLDIRECT   Risk Assessment/Calculations:         Physical Exam:    VS:  BP 138/60 (BP Location: Left Arm, Patient Position: Sitting, Cuff Size: Normal)   Pulse 75   Ht 5' 9 (1.753 m)   Wt 214 lb 3.2 oz (97.2 kg)   SpO2 98%   BMI 31.63 kg/m     Wt Readings from Last 3 Encounters:  10/03/23 214 lb 3.2 oz (97.2 kg)  06/28/23 214 lb (97.1 kg)  04/20/23 206 lb 3.2 oz (93.5 kg)     GEN:  Well nourished, well developed in no acute distress HEENT: Normal NECK: No JVD; No carotid bruits CARDIAC: RRR, no murmurs, rubs, gallops RESPIRATORY:  Clear to auscultation without rales, wheezing or rhonchi  ABDOMEN: Soft, non-tender, non-distended MUSCULOSKELETAL:  No edema; No deformity  SKIN: Warm and dry NEUROLOGIC:  Alert and oriented x 3 PSYCHIATRIC:  Normal affect   ASSESSMENT:    1. Primary hypertension   2. Complete heart block (HCC)   3. Paroxysmal atrial fibrillation (HCC)     PLAN:    In order of problems listed above:  Hypertension, BP improved but still elevated.  Advised to take hydralazine  as prescribed which should be 100 mg 3 times daily.  Continue lisinopril  40 mg daily, amlodipine  10 mg daily.  Continue low-salt diet and exercise. Complete heart block s/p PPM 01/2021 .  EKG today showing sinus rhythm.  Continue monitoring with device clinic. Paroxysmal atrial fibrillation, currently in sinus.  Continue Eliquis  5 mg twice daily.  Follow-up in 6 months.   Medication Adjustments/Labs and Tests Ordered: Current medicines are reviewed at length with the patient today.  Concerns  regarding medicines are outlined above.  Orders Placed This Encounter  Procedures   EKG 12-Lead    Meds ordered this encounter  Medications   hydrALAZINE  (APRESOLINE ) 100 MG tablet    Sig: Take 1 tablet (100 mg total) by mouth 3 (three) times daily.    Dispense:  180 tablet    Refill:  3     Patient Instructions  Medication Instructions:  - INCREASE hydralazine  to three times daily   *If you need a refill on your cardiac medications before your next appointment, please call your pharmacy*  Lab Work: No labs ordered today  If you have labs (blood work) drawn today and your tests are completely normal, you will receive your results only by: [employer] (  if you have MyChart) OR A paper copy in the mail If you have any lab test that is abnormal or we need to change your treatment, we will call you to review the results.  Testing/Procedures: No test ordered today   Follow-Up: At [facility], you and your health needs are our priority.  As part of our continuing mission to provide you with exceptional heart care, our providers are all part of one team.  This team includes your primary Cardiologist (physician) and Advanced Practice Providers or APPs (Physician Assistants and Nurse Practitioners) who all work together to provide you with the care you need, when you need it.  Your next appointment:   6 month(s)  Provider:   You may see Redell Cave, MD or one of the following Advanced Practice Providers on your designated Care Team:   Lonni Meager, NP Lesley Maffucci, PA-C Bernardino Bring, PA-C Cadence [place], PA-C Tylene Lunch, NP Barnie Hila, NP    We recommend signing up for the patient portal called MyChart.  Sign up information is provided on this After Visit Summary.  MyChart is used to connect with patients for Virtual Visits (Telemedicine).  Patients are able to view lab/test results, encounter notes, upcoming appointments, etc.  Non-urgent  messages can be sent to your provider as well.   To learn more about what you can do with MyChart, go to [email or web address].             Signed, Redell Cave, MD  10/03/2023 12:33 PM    [facility]

## 2023-10-06 IMAGING — DX DG CHEST 2V
2 series · 2 of 2 positions shown · non-contrast
Comparison: November 27, 2020.

CLINICAL DATA: Pacemaker placement.

EXAM:
CHEST - 2 VIEW

[chest pa]
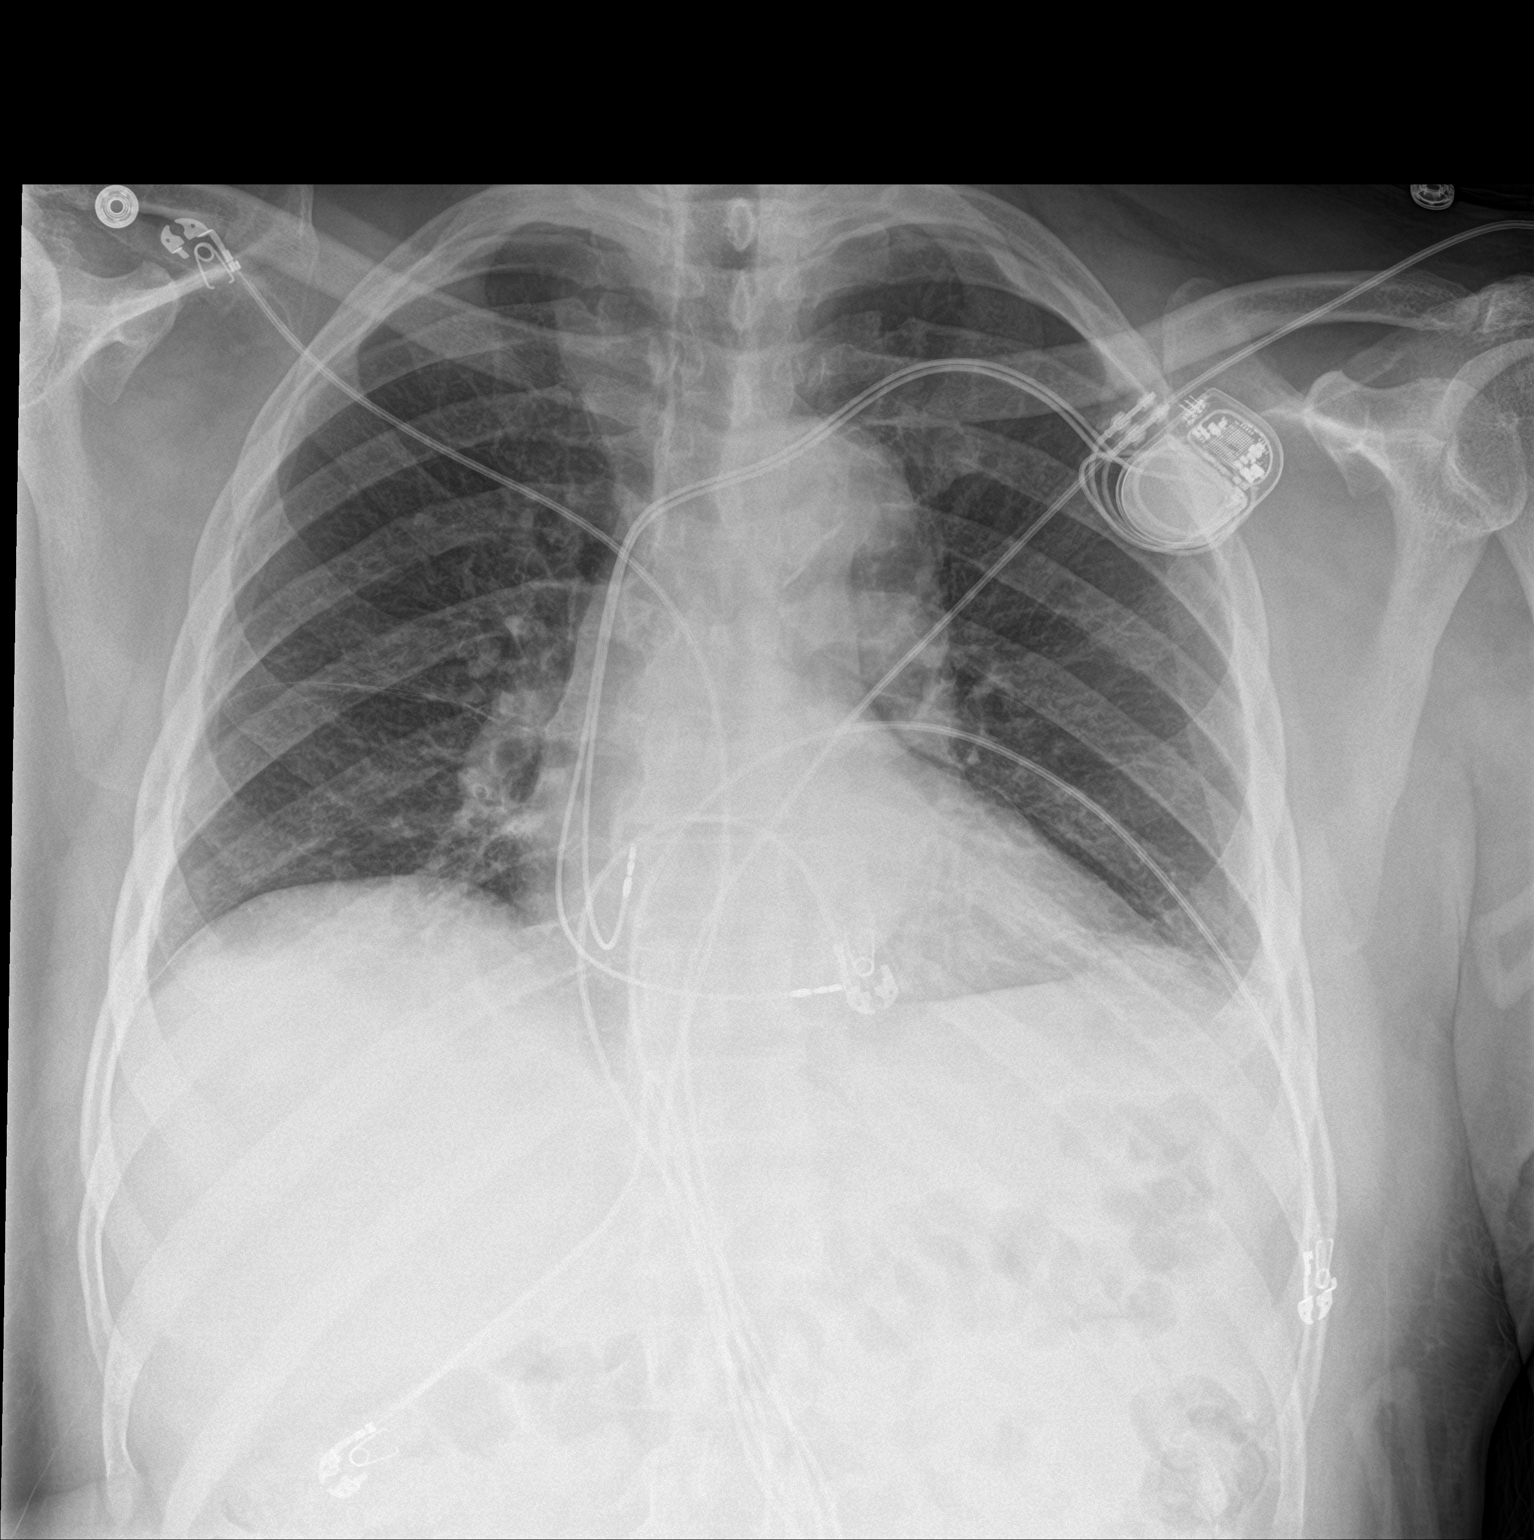

[chest lat]
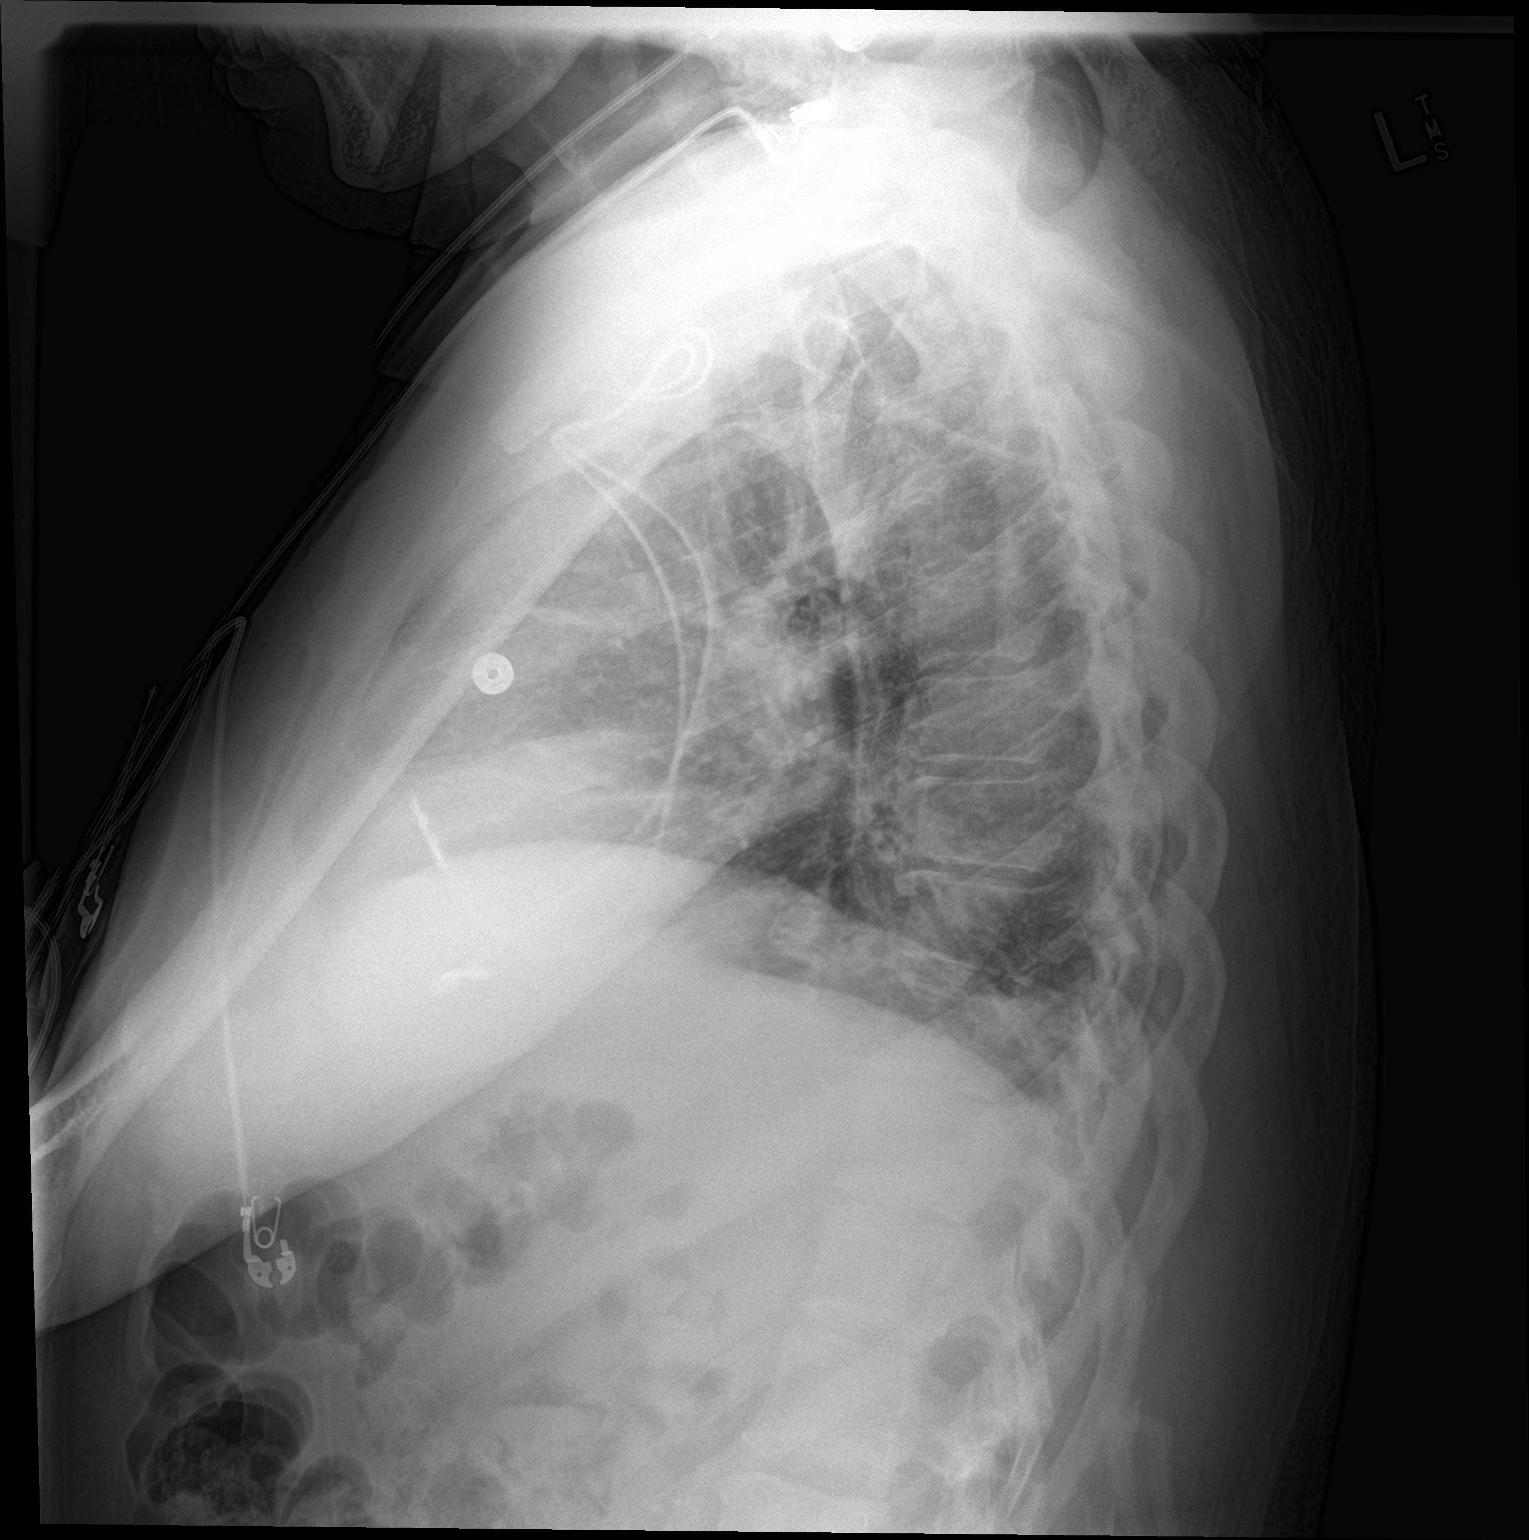

[2 of 2 positions shown; findings below may reference images not displayed]

FINDINGS: The heart size and mediastinal contours are within normal limits.
Both lungs are clear. Interval placement of left-sided pacemaker
with leads in grossly good position. No pneumothorax is noted. The
visualized skeletal structures are unremarkable.
IMPRESSION: Interval placement of left-sided pacemaker with leads in grossly
good position.

## 2023-10-12 NOTE — Progress Notes (Signed)
 Remote PPM Transmission

## 2023-11-08 ENCOUNTER — Ambulatory Visit (INDEPENDENT_AMBULATORY_CARE_PROVIDER_SITE_OTHER): Payer: Medicare (Managed Care)

## 2023-11-08 DIAGNOSIS — I442 Atrioventricular block, complete: Secondary | ICD-10-CM

## 2023-11-09 LAB — CUP PACEART REMOTE DEVICE CHECK
Date Time Interrogation Session: 20251029091423
Implantable Lead Connection Status: 753985
Implantable Lead Connection Status: 753985
Implantable Lead Implant Date: 20230130
Implantable Lead Implant Date: 20230130
Implantable Lead Location: 753859
Implantable Lead Location: 753860
Implantable Lead Model: 377171
Implantable Lead Model: 377171
Implantable Lead Serial Number: 7000391305
Implantable Lead Serial Number: 8000666643
Implantable Pulse Generator Implant Date: 20230130
Pulse Gen Model: 407145
Pulse Gen Serial Number: 70278108

## 2023-11-10 ENCOUNTER — Ambulatory Visit: Payer: Self-pay | Admitting: Cardiology

## 2023-11-15 NOTE — Progress Notes (Signed)
 Remote PPM Transmission

## 2024-02-07 ENCOUNTER — Ambulatory Visit: Attending: Cardiology

## 2024-02-07 DIAGNOSIS — I442 Atrioventricular block, complete: Secondary | ICD-10-CM

## 2024-02-08 LAB — CUP PACEART REMOTE DEVICE CHECK
Battery Voltage: 75
Date Time Interrogation Session: 20260127080752
Implantable Lead Connection Status: 753985
Implantable Lead Connection Status: 753985
Implantable Lead Implant Date: 20230130
Implantable Lead Implant Date: 20230130
Implantable Lead Location: 753859
Implantable Lead Location: 753860
Implantable Lead Model: 377171
Implantable Lead Model: 377171
Implantable Lead Serial Number: 7000391305
Implantable Lead Serial Number: 8000666643
Implantable Pulse Generator Implant Date: 20230130
Pulse Gen Model: 407145
Pulse Gen Serial Number: 70278108

## 2024-02-12 ENCOUNTER — Ambulatory Visit: Payer: Self-pay | Admitting: Cardiology

## 2024-02-15 NOTE — Progress Notes (Signed)
 Remote PPM Transmission

## 2024-05-08 ENCOUNTER — Ambulatory Visit

## 2024-08-07 ENCOUNTER — Ambulatory Visit

## 2024-11-06 ENCOUNTER — Ambulatory Visit

## 2025-02-05 ENCOUNTER — Ambulatory Visit

## 2025-05-07 ENCOUNTER — Ambulatory Visit
